# Patient Record
Sex: Female | Born: 1975
Health system: Southern US, Community
[De-identification: ages and names within clinical notes are randomized; demographics above are authoritative.]

## PROBLEM LIST (undated history)

## (undated) DIAGNOSIS — J189 Pneumonia, unspecified organism: Secondary | ICD-10-CM

## (undated) DIAGNOSIS — R112 Nausea with vomiting, unspecified: Secondary | ICD-10-CM

## (undated) DIAGNOSIS — H409 Unspecified glaucoma: Secondary | ICD-10-CM

## (undated) DIAGNOSIS — N189 Chronic kidney disease, unspecified: Secondary | ICD-10-CM

## (undated) DIAGNOSIS — T7840XA Allergy, unspecified, initial encounter: Secondary | ICD-10-CM

## (undated) DIAGNOSIS — I10 Essential (primary) hypertension: Secondary | ICD-10-CM

## (undated) DIAGNOSIS — E669 Obesity, unspecified: Secondary | ICD-10-CM

## (undated) DIAGNOSIS — D869 Sarcoidosis, unspecified: Secondary | ICD-10-CM

## (undated) DIAGNOSIS — Z9889 Other specified postprocedural states: Secondary | ICD-10-CM

## (undated) DIAGNOSIS — F32A Depression, unspecified: Secondary | ICD-10-CM

## (undated) DIAGNOSIS — G932 Benign intracranial hypertension: Secondary | ICD-10-CM

## (undated) DIAGNOSIS — F329 Major depressive disorder, single episode, unspecified: Secondary | ICD-10-CM

## (undated) DIAGNOSIS — K760 Fatty (change of) liver, not elsewhere classified: Secondary | ICD-10-CM

## (undated) HISTORY — DX: Depression, unspecified: F32.A

## (undated) HISTORY — PX: CHOLECYSTECTOMY: SHX55

## (undated) HISTORY — PX: OTHER SURGICAL HISTORY: SHX169

## (undated) HISTORY — DX: Allergy, unspecified, initial encounter: T78.40XA

## (undated) HISTORY — DX: Major depressive disorder, single episode, unspecified: F32.9

## (undated) HISTORY — DX: Unspecified glaucoma: H40.9

## (undated) HISTORY — PX: LAPAROSCOPIC GASTRIC SLEEVE RESECTION: SHX5895

## (undated) HISTORY — DX: Benign intracranial hypertension: G93.2

## (undated) HISTORY — PX: TRIGGER FINGER RELEASE: SHX641

## (undated) HISTORY — DX: Obesity, unspecified: E66.9

## (undated) HISTORY — PX: ABDOMINAL HYSTERECTOMY: SHX81

## (undated) HISTORY — PX: EYE SURGERY: SHX253

## (undated) HISTORY — PX: CARDIAC CATHETERIZATION: SHX172

## (undated) MED FILL — Medication: Fill #0 | Status: CN

---

## 1997-11-27 ENCOUNTER — Other Ambulatory Visit: Admission: RE | Admit: 1997-11-27 | Discharge: 1997-11-27 | Payer: Self-pay | Admitting: Family Medicine

## 1998-03-12 ENCOUNTER — Emergency Department (HOSPITAL_COMMUNITY): Admission: EM | Admit: 1998-03-12 | Discharge: 1998-03-12 | Payer: Self-pay | Admitting: Emergency Medicine

## 1998-04-15 ENCOUNTER — Other Ambulatory Visit: Admission: RE | Admit: 1998-04-15 | Discharge: 1998-04-15 | Payer: Self-pay | Admitting: Obstetrics and Gynecology

## 1998-09-21 ENCOUNTER — Ambulatory Visit (HOSPITAL_COMMUNITY): Admission: RE | Admit: 1998-09-21 | Discharge: 1998-09-21 | Payer: Self-pay | Admitting: Obstetrics and Gynecology

## 1999-03-04 ENCOUNTER — Encounter (INDEPENDENT_AMBULATORY_CARE_PROVIDER_SITE_OTHER): Payer: Self-pay | Admitting: Specialist

## 1999-03-04 ENCOUNTER — Other Ambulatory Visit: Admission: RE | Admit: 1999-03-04 | Discharge: 1999-03-04 | Payer: Self-pay | Admitting: Obstetrics and Gynecology

## 1999-05-17 ENCOUNTER — Inpatient Hospital Stay (HOSPITAL_COMMUNITY): Admission: AD | Admit: 1999-05-17 | Discharge: 1999-05-17 | Payer: Self-pay | Admitting: Obstetrics and Gynecology

## 1999-08-17 ENCOUNTER — Encounter (INDEPENDENT_AMBULATORY_CARE_PROVIDER_SITE_OTHER): Payer: Self-pay

## 1999-08-17 ENCOUNTER — Inpatient Hospital Stay (HOSPITAL_COMMUNITY): Admission: RE | Admit: 1999-08-17 | Discharge: 1999-08-18 | Payer: Self-pay | Admitting: Obstetrics and Gynecology

## 1999-08-31 ENCOUNTER — Emergency Department (HOSPITAL_COMMUNITY): Admission: EM | Admit: 1999-08-31 | Discharge: 1999-08-31 | Payer: Self-pay | Admitting: Emergency Medicine

## 1999-09-16 ENCOUNTER — Emergency Department (HOSPITAL_COMMUNITY): Admission: EM | Admit: 1999-09-16 | Discharge: 1999-09-16 | Payer: Self-pay | Admitting: Emergency Medicine

## 1999-12-27 ENCOUNTER — Encounter: Payer: Self-pay | Admitting: Emergency Medicine

## 1999-12-27 ENCOUNTER — Emergency Department (HOSPITAL_COMMUNITY): Admission: EM | Admit: 1999-12-27 | Discharge: 1999-12-27 | Payer: Self-pay | Admitting: Emergency Medicine

## 2000-03-26 ENCOUNTER — Encounter: Payer: Self-pay | Admitting: Obstetrics and Gynecology

## 2000-03-26 ENCOUNTER — Ambulatory Visit (HOSPITAL_COMMUNITY): Admission: RE | Admit: 2000-03-26 | Discharge: 2000-03-26 | Payer: Self-pay | Admitting: Obstetrics and Gynecology

## 2000-05-01 ENCOUNTER — Encounter: Payer: Self-pay | Admitting: Emergency Medicine

## 2000-05-01 ENCOUNTER — Emergency Department (HOSPITAL_COMMUNITY): Admission: EM | Admit: 2000-05-01 | Discharge: 2000-05-02 | Payer: Self-pay | Admitting: Emergency Medicine

## 2000-06-16 ENCOUNTER — Emergency Department (HOSPITAL_COMMUNITY): Admission: EM | Admit: 2000-06-16 | Discharge: 2000-06-17 | Payer: Self-pay | Admitting: Emergency Medicine

## 2000-06-16 ENCOUNTER — Encounter: Payer: Self-pay | Admitting: Emergency Medicine

## 2000-08-19 ENCOUNTER — Emergency Department (HOSPITAL_COMMUNITY): Admission: EM | Admit: 2000-08-19 | Discharge: 2000-08-19 | Payer: Self-pay | Admitting: *Deleted

## 2000-10-30 ENCOUNTER — Emergency Department (HOSPITAL_COMMUNITY): Admission: EM | Admit: 2000-10-30 | Discharge: 2000-10-31 | Payer: Self-pay | Admitting: Emergency Medicine

## 2000-10-31 ENCOUNTER — Encounter: Payer: Self-pay | Admitting: Emergency Medicine

## 2000-11-06 ENCOUNTER — Encounter: Payer: Self-pay | Admitting: *Deleted

## 2000-11-06 ENCOUNTER — Ambulatory Visit (HOSPITAL_COMMUNITY): Admission: RE | Admit: 2000-11-06 | Discharge: 2000-11-06 | Payer: Self-pay | Admitting: *Deleted

## 2001-02-15 ENCOUNTER — Encounter (INDEPENDENT_AMBULATORY_CARE_PROVIDER_SITE_OTHER): Payer: Self-pay | Admitting: *Deleted

## 2001-02-15 ENCOUNTER — Ambulatory Visit (HOSPITAL_COMMUNITY): Admission: RE | Admit: 2001-02-15 | Discharge: 2001-02-15 | Payer: Self-pay | Admitting: Obstetrics and Gynecology

## 2001-06-10 ENCOUNTER — Encounter: Payer: Self-pay | Admitting: Family Medicine

## 2001-06-10 ENCOUNTER — Ambulatory Visit (HOSPITAL_COMMUNITY): Admission: RE | Admit: 2001-06-10 | Discharge: 2001-06-10 | Payer: Self-pay | Admitting: Family Medicine

## 2001-11-08 ENCOUNTER — Encounter: Payer: Self-pay | Admitting: Family Medicine

## 2001-11-08 ENCOUNTER — Ambulatory Visit (HOSPITAL_COMMUNITY): Admission: RE | Admit: 2001-11-08 | Discharge: 2001-11-08 | Payer: Self-pay | Admitting: Family Medicine

## 2001-12-21 ENCOUNTER — Emergency Department (HOSPITAL_COMMUNITY): Admission: EM | Admit: 2001-12-21 | Discharge: 2001-12-22 | Payer: Self-pay | Admitting: Emergency Medicine

## 2001-12-22 ENCOUNTER — Encounter: Payer: Self-pay | Admitting: Emergency Medicine

## 2002-01-22 ENCOUNTER — Encounter: Payer: Self-pay | Admitting: Emergency Medicine

## 2002-01-22 ENCOUNTER — Emergency Department (HOSPITAL_COMMUNITY): Admission: EM | Admit: 2002-01-22 | Discharge: 2002-01-23 | Payer: Self-pay | Admitting: Emergency Medicine

## 2002-05-12 ENCOUNTER — Encounter: Payer: Self-pay | Admitting: Family Medicine

## 2002-05-12 ENCOUNTER — Ambulatory Visit (HOSPITAL_COMMUNITY): Admission: RE | Admit: 2002-05-12 | Discharge: 2002-05-12 | Payer: Self-pay | Admitting: Family Medicine

## 2002-05-14 ENCOUNTER — Ambulatory Visit (HOSPITAL_COMMUNITY): Admission: RE | Admit: 2002-05-14 | Discharge: 2002-05-14 | Payer: Self-pay | Admitting: Family Medicine

## 2002-05-14 ENCOUNTER — Encounter: Payer: Self-pay | Admitting: Family Medicine

## 2002-08-05 ENCOUNTER — Emergency Department (HOSPITAL_COMMUNITY): Admission: EM | Admit: 2002-08-05 | Discharge: 2002-08-05 | Payer: Self-pay | Admitting: Emergency Medicine

## 2002-09-17 ENCOUNTER — Emergency Department (HOSPITAL_COMMUNITY): Admission: EM | Admit: 2002-09-17 | Discharge: 2002-09-17 | Payer: Self-pay | Admitting: Emergency Medicine

## 2002-10-20 ENCOUNTER — Emergency Department (HOSPITAL_COMMUNITY): Admission: EM | Admit: 2002-10-20 | Discharge: 2002-10-21 | Payer: Self-pay | Admitting: Emergency Medicine

## 2002-10-21 ENCOUNTER — Encounter: Payer: Self-pay | Admitting: Emergency Medicine

## 2002-11-01 ENCOUNTER — Emergency Department (HOSPITAL_COMMUNITY): Admission: EM | Admit: 2002-11-01 | Discharge: 2002-11-01 | Payer: Self-pay | Admitting: Emergency Medicine

## 2002-11-01 ENCOUNTER — Encounter: Payer: Self-pay | Admitting: Emergency Medicine

## 2003-01-03 ENCOUNTER — Emergency Department (HOSPITAL_COMMUNITY): Admission: EM | Admit: 2003-01-03 | Discharge: 2003-01-04 | Payer: Self-pay | Admitting: Emergency Medicine

## 2003-01-04 ENCOUNTER — Encounter: Payer: Self-pay | Admitting: Emergency Medicine

## 2003-01-06 ENCOUNTER — Ambulatory Visit (HOSPITAL_COMMUNITY): Admission: RE | Admit: 2003-01-06 | Discharge: 2003-01-06 | Payer: Self-pay | Admitting: Family Medicine

## 2003-01-06 ENCOUNTER — Encounter: Payer: Self-pay | Admitting: Family Medicine

## 2003-03-18 ENCOUNTER — Other Ambulatory Visit: Admission: RE | Admit: 2003-03-18 | Discharge: 2003-03-18 | Payer: Self-pay | Admitting: Obstetrics and Gynecology

## 2003-04-07 ENCOUNTER — Emergency Department (HOSPITAL_COMMUNITY): Admission: EM | Admit: 2003-04-07 | Discharge: 2003-04-07 | Payer: Self-pay | Admitting: Emergency Medicine

## 2003-07-30 ENCOUNTER — Emergency Department (HOSPITAL_COMMUNITY): Admission: EM | Admit: 2003-07-30 | Discharge: 2003-07-30 | Payer: Self-pay | Admitting: Emergency Medicine

## 2004-02-04 ENCOUNTER — Ambulatory Visit (HOSPITAL_COMMUNITY): Admission: RE | Admit: 2004-02-04 | Discharge: 2004-02-04 | Payer: Self-pay | Admitting: Family Medicine

## 2004-02-05 ENCOUNTER — Emergency Department (HOSPITAL_COMMUNITY): Admission: EM | Admit: 2004-02-05 | Discharge: 2004-02-05 | Payer: Self-pay | Admitting: Emergency Medicine

## 2004-08-29 ENCOUNTER — Emergency Department (HOSPITAL_COMMUNITY): Admission: EM | Admit: 2004-08-29 | Discharge: 2004-08-29 | Payer: Self-pay | Admitting: Emergency Medicine

## 2004-08-30 ENCOUNTER — Encounter: Admission: RE | Admit: 2004-08-30 | Discharge: 2004-08-30 | Payer: Self-pay | Admitting: Cardiology

## 2004-10-28 ENCOUNTER — Encounter (INDEPENDENT_AMBULATORY_CARE_PROVIDER_SITE_OTHER): Payer: Self-pay | Admitting: Specialist

## 2004-10-28 ENCOUNTER — Ambulatory Visit (HOSPITAL_COMMUNITY): Admission: RE | Admit: 2004-10-28 | Discharge: 2004-10-28 | Payer: Self-pay

## 2004-11-03 ENCOUNTER — Emergency Department (HOSPITAL_COMMUNITY): Admission: EM | Admit: 2004-11-03 | Discharge: 2004-11-03 | Payer: Self-pay | Admitting: Emergency Medicine

## 2004-12-26 ENCOUNTER — Ambulatory Visit: Payer: Self-pay | Admitting: Internal Medicine

## 2005-08-06 ENCOUNTER — Encounter: Admission: RE | Admit: 2005-08-06 | Discharge: 2005-08-06 | Payer: Self-pay | Admitting: Family Medicine

## 2005-10-11 ENCOUNTER — Encounter: Admission: RE | Admit: 2005-10-11 | Discharge: 2005-10-11 | Payer: Self-pay | Admitting: Gastroenterology

## 2005-10-31 ENCOUNTER — Encounter: Admission: RE | Admit: 2005-10-31 | Discharge: 2005-10-31 | Payer: Self-pay | Admitting: Gastroenterology

## 2005-11-03 ENCOUNTER — Emergency Department (HOSPITAL_COMMUNITY): Admission: EM | Admit: 2005-11-03 | Discharge: 2005-11-03 | Payer: Self-pay | Admitting: Emergency Medicine

## 2006-10-14 ENCOUNTER — Emergency Department (HOSPITAL_COMMUNITY): Admission: EM | Admit: 2006-10-14 | Discharge: 2006-10-14 | Payer: Self-pay | Admitting: Emergency Medicine

## 2006-12-07 ENCOUNTER — Emergency Department (HOSPITAL_COMMUNITY): Admission: EM | Admit: 2006-12-07 | Discharge: 2006-12-07 | Payer: Self-pay | Admitting: Emergency Medicine

## 2006-12-21 ENCOUNTER — Emergency Department (HOSPITAL_COMMUNITY): Admission: EM | Admit: 2006-12-21 | Discharge: 2006-12-21 | Payer: Self-pay | Admitting: Emergency Medicine

## 2007-07-13 ENCOUNTER — Emergency Department (HOSPITAL_COMMUNITY): Admission: EM | Admit: 2007-07-13 | Discharge: 2007-07-13 | Payer: Self-pay | Admitting: Emergency Medicine

## 2007-12-14 ENCOUNTER — Emergency Department (HOSPITAL_BASED_OUTPATIENT_CLINIC_OR_DEPARTMENT_OTHER): Admission: EM | Admit: 2007-12-14 | Discharge: 2007-12-14 | Payer: Self-pay | Admitting: Emergency Medicine

## 2007-12-15 ENCOUNTER — Ambulatory Visit (HOSPITAL_COMMUNITY): Admission: RE | Admit: 2007-12-15 | Discharge: 2007-12-15 | Payer: Self-pay | Admitting: Emergency Medicine

## 2008-01-06 ENCOUNTER — Emergency Department (HOSPITAL_COMMUNITY): Admission: EM | Admit: 2008-01-06 | Discharge: 2008-01-07 | Payer: Self-pay | Admitting: Emergency Medicine

## 2008-10-01 ENCOUNTER — Ambulatory Visit: Payer: Self-pay | Admitting: Radiology

## 2008-10-01 ENCOUNTER — Emergency Department (HOSPITAL_BASED_OUTPATIENT_CLINIC_OR_DEPARTMENT_OTHER): Admission: EM | Admit: 2008-10-01 | Discharge: 2008-10-01 | Payer: Self-pay | Admitting: Emergency Medicine

## 2009-07-04 ENCOUNTER — Encounter: Payer: Self-pay | Admitting: Emergency Medicine

## 2009-07-04 ENCOUNTER — Ambulatory Visit: Payer: Self-pay | Admitting: Diagnostic Radiology

## 2009-07-05 ENCOUNTER — Encounter (INDEPENDENT_AMBULATORY_CARE_PROVIDER_SITE_OTHER): Payer: Self-pay | Admitting: Internal Medicine

## 2009-07-05 ENCOUNTER — Observation Stay (HOSPITAL_COMMUNITY): Admission: EM | Admit: 2009-07-05 | Discharge: 2009-07-07 | Payer: Self-pay | Admitting: Internal Medicine

## 2009-07-28 ENCOUNTER — Ambulatory Visit (HOSPITAL_BASED_OUTPATIENT_CLINIC_OR_DEPARTMENT_OTHER): Admission: RE | Admit: 2009-07-28 | Discharge: 2009-07-28 | Payer: Self-pay | Admitting: Internal Medicine

## 2009-07-31 ENCOUNTER — Ambulatory Visit: Payer: Self-pay | Admitting: Internal Medicine

## 2010-01-02 ENCOUNTER — Ambulatory Visit: Payer: Self-pay | Admitting: Diagnostic Radiology

## 2010-01-02 ENCOUNTER — Emergency Department (HOSPITAL_BASED_OUTPATIENT_CLINIC_OR_DEPARTMENT_OTHER): Admission: EM | Admit: 2010-01-02 | Discharge: 2010-01-03 | Payer: Self-pay | Admitting: Emergency Medicine

## 2010-04-03 DIAGNOSIS — D869 Sarcoidosis, unspecified: Secondary | ICD-10-CM

## 2010-04-03 HISTORY — DX: Sarcoidosis, unspecified: D86.9

## 2010-04-24 ENCOUNTER — Encounter: Payer: Self-pay | Admitting: Gastroenterology

## 2010-04-25 ENCOUNTER — Encounter: Payer: Self-pay | Admitting: Emergency Medicine

## 2010-06-22 LAB — COMPREHENSIVE METABOLIC PANEL
ALT: 14 U/L (ref 0–35)
AST: 20 U/L (ref 0–37)
Albumin: 4.4 g/dL (ref 3.5–5.2)
Alkaline Phosphatase: 95 U/L (ref 39–117)
GFR calc Af Amer: >60 mL/min (ref >60)
GFR calc non Af Amer: >60 mL/min (ref >60)
Glucose, Bld: 288 mg/dL — ABNORMAL HIGH (ref 70–99)
Total Bilirubin: 0.3 mg/dL (ref 0.3–1.2)
Total Protein: 8.2 g/dL (ref 6.0–8.3)

## 2010-06-22 LAB — BASIC METABOLIC PANEL
BUN: 14 mg/dL (ref 6–23)
Calcium: 9 mg/dL (ref 8.4–10.5)
GFR calc Af Amer: >60 mL/min (ref >60)
GFR calc non Af Amer: >60 mL/min (ref >60)
GFR calc non Af Amer: >60 mL/min (ref >60)
Glucose, Bld: 97 mg/dL (ref 70–99)
Potassium: 3.9 mEq/L (ref 3.5–5.1)
Sodium: 139 mEq/L (ref 135–145)
Sodium: 139 mEq/L (ref 135–145)

## 2010-06-22 LAB — DIFFERENTIAL
Basophils Absolute: 0 10*3/uL (ref 0.0–0.1)
Lymphocytes Relative: 14 % (ref 12–46)
Monocytes Relative: 3 % (ref 3–12)
Neutrophils Relative %: 83 % — ABNORMAL HIGH (ref 43–77)

## 2010-06-22 LAB — CBC
HCT: 36.7 % (ref 36.0–46.0)
Hemoglobin: 12.3 g/dL (ref 12.0–15.0)
Hemoglobin: 12.4 g/dL (ref 12.0–15.0)
Hemoglobin: 12.5 g/dL (ref 12.0–15.0)
MCHC: 32.5 g/dL (ref 30.0–36.0)
MCV: 83.9 fL (ref 78.0–100.0)
Platelets: 405 10*3/uL — ABNORMAL HIGH (ref 150–400)
Platelets: 351 10*3/uL (ref 150–400)
Platelets: 353 10*3/uL (ref 150–400)
RDW: 14.5 % (ref 11.5–15.5)
RDW: 15.6 % — ABNORMAL HIGH (ref 11.5–15.5)
WBC: 14.5 10*3/uL — ABNORMAL HIGH (ref 4.0–10.5)

## 2010-06-22 LAB — CARDIAC PANEL(CRET KIN+CKTOT+MB+TROPI)
CK, MB: 0.9 ng/mL (ref 0.3–4.0)
CK, MB: 1.3 ng/mL (ref 0.3–4.0)
Relative Index: 0.8 (ref 0.0–2.5)
Total CK: 117 U/L (ref 7–177)
Total CK: 124 U/L (ref 7–177)
Troponin I: <0.01 ng/mL (ref 0.00–0.06)

## 2010-06-22 LAB — POCT I-STAT 3, VENOUS BLOOD GAS (G3P V)
Acid-base deficit: 3 mmol/L — ABNORMAL HIGH (ref 0.0–2.0)
Bicarbonate: 23.2 mEq/L (ref 20.0–24.0)
O2 Saturation: 73 %
pO2, Ven: 41 mmHg (ref 30.0–45.0)

## 2010-06-22 LAB — POCT I-STAT 3, ART BLOOD GAS (G3+)
Acid-base deficit: 1 mmol/L (ref 0.0–2.0)
O2 Saturation: 97 %
pO2, Arterial: 91 mmHg (ref 80.0–100.0)

## 2010-06-22 LAB — POCT CARDIAC MARKERS
CKMB, poc: 1 ng/mL (ref 1.0–8.0)
Myoglobin, poc: 62.7 ng/mL (ref 12–200)

## 2010-06-22 LAB — PROTIME-INR: Prothrombin Time: 12.8 seconds (ref 11.6–15.2)

## 2010-06-22 LAB — ANGIOTENSIN CONVERTING ENZYME: Angiotensin-Converting Enzyme: 69 U/L — ABNORMAL HIGH (ref 9–67)

## 2010-06-22 LAB — LIPID PANEL
HDL: 37 mg/dL — ABNORMAL LOW (ref >39)
Total CHOL/HDL Ratio: 3.4 RATIO

## 2010-06-28 ENCOUNTER — Emergency Department (HOSPITAL_COMMUNITY)
Admission: EM | Admit: 2010-06-28 | Discharge: 2010-06-28 | Disposition: A | Discharge status: Home / Self Care | Payer: BC Managed Care – PPO | Attending: Emergency Medicine | Admitting: Emergency Medicine

## 2010-06-28 ENCOUNTER — Emergency Department (HOSPITAL_COMMUNITY): Payer: BC Managed Care – PPO

## 2010-06-28 DIAGNOSIS — J45909 Unspecified asthma, uncomplicated: Secondary | ICD-10-CM | POA: Insufficient documentation

## 2010-06-28 DIAGNOSIS — R0602 Shortness of breath: Secondary | ICD-10-CM | POA: Insufficient documentation

## 2010-06-28 DIAGNOSIS — R070 Pain in throat: Secondary | ICD-10-CM | POA: Insufficient documentation

## 2010-06-28 DIAGNOSIS — R0989 Other specified symptoms and signs involving the circulatory and respiratory systems: Secondary | ICD-10-CM | POA: Insufficient documentation

## 2010-06-28 DIAGNOSIS — J329 Chronic sinusitis, unspecified: Secondary | ICD-10-CM | POA: Insufficient documentation

## 2010-06-28 DIAGNOSIS — J3489 Other specified disorders of nose and nasal sinuses: Secondary | ICD-10-CM | POA: Insufficient documentation

## 2010-06-28 DIAGNOSIS — Z79899 Other long term (current) drug therapy: Secondary | ICD-10-CM | POA: Insufficient documentation

## 2010-06-28 DIAGNOSIS — R51 Headache: Secondary | ICD-10-CM | POA: Insufficient documentation

## 2010-06-28 DIAGNOSIS — R0609 Other forms of dyspnea: Secondary | ICD-10-CM | POA: Insufficient documentation

## 2010-07-10 LAB — D-DIMER, QUANTITATIVE: D-Dimer, Quant: <0.22 ug{FEU}/mL (ref 0.00–0.48)

## 2010-07-10 LAB — CBC
HCT: 38 % (ref 36.0–46.0)
Hemoglobin: 12.8 g/dL (ref 12.0–15.0)
RBC: 4.57 MIL/uL (ref 3.87–5.11)
WBC: 11.1 10*3/uL — ABNORMAL HIGH (ref 4.0–10.5)

## 2010-07-10 LAB — POCT CARDIAC MARKERS
CKMB, poc: <1 ng/mL — ABNORMAL LOW (ref 1.0–8.0)
CKMB, poc: <1 ng/mL — ABNORMAL LOW (ref 1.0–8.0)
Myoglobin, poc: 72.9 ng/mL (ref 12–200)
Troponin i, poc: <0.05 ng/mL (ref 0.00–0.09)

## 2010-07-10 LAB — DIFFERENTIAL
Basophils Absolute: 0 K/uL (ref 0.0–0.1)
Basophils Relative: 0 % (ref 0–1)
Eosinophils Absolute: 0.3 K/uL (ref 0.0–0.7)
Eosinophils Relative: 2 % (ref 0–5)
Lymphocytes Relative: 38 % (ref 12–46)
Lymphs Abs: 4.3 K/uL — ABNORMAL HIGH (ref 0.7–4.0)
Monocytes Absolute: 0.7 K/uL (ref 0.1–1.0)
Monocytes Relative: 6 % (ref 3–12)
Neutro Abs: 5.8 K/uL (ref 1.7–7.7)
Neutrophils Relative %: 53 % (ref 43–77)

## 2010-07-10 LAB — BASIC METABOLIC PANEL
GFR calc Af Amer: >60 mL/min (ref >60)
GFR calc non Af Amer: >60 mL/min (ref >60)
Potassium: 3.9 mEq/L (ref 3.5–5.1)
Sodium: 142 mEq/L (ref 135–145)

## 2010-08-19 NOTE — H&P (Signed)
[facility]  Patient:    Denise Cook, Denise Cook                    MRN: [id number] Adm. Date:  [id number] Disc. Date: [id number] Attending:  Malon Kindle                         History and Physical  CHIEF COMPLAINT:  Pelvic pain and irregular menses.  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 In March of 2000, she complained of left lower quadrant pain which radiated to the midline for approximately 1 month and was worse when she was on her feet and was constant with exacerbations and was crampy in nature. There were no significant aggravating or relieving factors and she was taking oral contraceptives at that point. She was continued on her oral contraceptives, treated with an antibiotic and nonsteroidals and had no improvement in her pain. Thus on June 20 of last year, she underwent laparoscopic lysis of adhesions with fulguration of endometriosis and bilateral tubal fulguration. At that time, she was found to have endometriotic implants in the posterior cul-de-sac, the uterosacral ligaments and the left ovarian fossa and her sigmoid colon was adherent to the left pelvic brim. Postoperatively, she was treated with 3 months of Lupron and she did have some menopausal symptoms on this treated with Prempro 2.5. She was seen in December of 2000 for follow-up and was having some recurrence of her pain. Options were discussed at that point and she agreed to try Lunelle for ovarian suppression for 3 to 4 months. She only got 1 Lunelle injection as this did not agree with her. I most recently saw her on March of this year. She complained of irregular menses every 2 to 3 months passing clots with persistent pelvic pain. The 1 month of Lunelle did not help these symptoms and again she failed Lupron  therapy in the past. She desires definitive surgical therapy. We discussed her young age and her possible need for long-term hormone replacement therapy if her ovaries are removed. She does not desire to retain her fertility and wishes to proceed with definitive surgical therapy.  PAST OB HISTORY:  Significant for 1 vaginal delivery at term without complications. 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 With normal follow-up Pap smears the most recent being in December of 2000.  PAST MEDICAL HISTORY:  Significant for a remote history of asthma and she uses her albuterol inhaler rarely and has not used it in the past 6 months.  PAST SURGICAL HISTORY:  Significant only for the above mentioned laparoscopy with lysis of adhesions, fulguration of endometriosis and bilateral tubal fulguration.  ALLERGIES:  None.  CURRENT MEDICATIONS:  Just the albuterol MDI rarely.  FAMILY HISTORY:  Noncontributory.  REVIEW OF SYSTEMS:  Positive for dyspareunia and occasional dysuria.  PHYSICAL EXAMINATION:  GENERAL:  She is a well-developed, well-nourished black female who is in no acute distress.  VITAL SIGNS:  Her weight is approximately 200 pounds.  HEENT:  Pupils equal round and reactive to light and accommodation. Extraocular muscles are intact. Oropharynx is clear without erythema or exudates.  NECK:  Supple without lymphadenopathy or thyromegaly.  LUNGS:  Clear to auscultation.  HEART:  Regular rate and rhythm without murmurs.  ABDOMEN:  Soft, nontender, nondistended without  palpable masses and with a well healed laparoscopic incision infraumbilically and suprapubically.  EXTREMITIES:  No edema, are nontender and DTRs are 2/4 and symmetric.  PELVIC:  External genitalia reveals no lesions. On speculum exam, there are no lesions and a Pipelle in the past has sounded to 7 cm. On bimanual exam, she has a small anteverted uterus which is slightly tender. She had  slightly tender uterosacral ligaments and tender bilateral adnexa without palpable masses.  ASSESSMENT:  Recurrent persistent pelvic pain probably due to recurrent endometriosis. The patient also has irregular bleeding. Options have been discussed with the patient and although she is young, she wishes to proceed with definitive surgical therapy. The risks of surgery including bleeding, infection and damage to surrounding organs as well as permanent sterility have been discussed with her and she agrees to proceed. We have agreed to attempt to preserve her ovaries unless they appear significantly involved with endometriosis or appear otherwise abnormal.  PLAN:  Admit the patient for laparoscopically assisted vaginal hysterectomy with possible bilateral salpingo-oophorectomy and cystoscopy to confirm ureteral patency. DD:  08/15/99 TD:  08/16/99 Job: [id number]

## 2010-08-19 NOTE — Op Note (Signed)
[facility]  Patient:    Denise Cook, Denise Cook Visit Number: [id number] MRN: [id number]          Service Type: DSU Location: DAY Attending Physician:  Michaele Offer Proc. Date: 02/15/01 Admit Date:  02/15/2001                             Operative Report  PREOPERATIVE DIAGNOSES:  Pelvic pain and history of endometriosis.  POSTOPERATIVE DIAGNOSES:  Pelvic pain, pelvic adhesions.  PROCEDURE:  Open laparoscopic bilateral salpingo-oophorectomy and adhesiolysis.  SURGEON:  Zenaida Niece, M.D.  ANESTHESIA:  General endotracheal tube.  ESTIMATED BLOOD LOSS:  Less than 50 cc.  FINDINGS:  Both ovaries were adherent laterally but otherwise appeared normal. Sigmoid colon was adherent to the left abdominal wall and left pelvic brim; there were omental and bowel adhesions in the pelvis.  PROCEDURE IN DETAIL:  The patient was taken to the operating room and placed in the dorsal supine position.  General anesthesia was induced, and she was placed in mobile stirrups.  Her abdomen was prepped and draped in the usual sterile fashion for a laparoscopic procedure; her bladder was drained with a red rubber catheter, and a sponge stick was placed in her vagina for manipulation.  Her infraumbilical skin was then infiltrated with 0.25% Marcaine.  A 3 cm horizontal incision was made and carried down to the fascia. The fascia was identified and elevated with Kelly clamps.  It was then incised sharply and the peritoneum entered bluntly.  A pursestring suture of 0 Vicryl was placed around the fascia and held for later use.  The Hasson cannula was then introduced and the abdomen insufflated with CO2 gas.  Placement was confirmed by the laparoscope.  The 5 mm ports were placed on each side under direct visualization through previous scars.  Inspection revealed the above-mentioned findings.  The sigmoid colon was taken down bluntly and sharply from the left  pelvic brim and left abdominal sidewall.  Adhesions in the pelvis were taken down with bipolar cautery followed by incision.  The right infundibulopelvic ligament was isolated and coagulated with bipolar cautery and transected.  The mesosalpinx and mesovarium was then likewise coagulated and transected to free up the right ovary.  The ovary was placed in the posterior cul-de-sac for later removal.  Further adhesions were taken down on the left side to isolate the left ovary.  The left infundibulopelvic ligament was desiccated with bipolar cautery and transected.  The ovary was also adherent to the sidewall.  Mesosalpinx and adhesions were taken down with bipolar cautery and sharp dissection to free up the left ovary.  This was also then placed in the cul-de-sac.  Both ovaries were then placed in the anterior cul-de-sac.  All sites were inspected and found to be hemostatic.  The pelvis was irrigated and again found to be hemostatic.  The laparoscope was removed, and the 5 mm scope was placed through the left lower quadrant.  An EndoCatch was placed through the umbilical trocar and both ovaries scooped into the EndoCatch.  These were then removed through the umbilical incision after the fascial incision was extended a short distance sharply.  The umbilical trocar was reintroduced, and inspection revealed the pelvis to be hemostatic. Intergel was then placed in the pelvis to help prevent further adhesions.  The 5 mm ports were removed under direct visualization.  All gas was allowed to deflate from the abdomen, and the  umbilical trocar was removed.  The previously-placed pursestring suture was tied and achieved adequate closure of the fascia.  The infraumbilical incision was closed with running subcuticular suture of 4-0 Vicryl.  The other incisions were closed with interrupted subcuticular sutures of 4-0 Vicryl, and all incisions were then closed with Steri-Strips and band-aids.  The patient  tolerated the procedure well, was awakened in the operating room and taken to the recovery room in stable condition. Attending Physician:  Michaele Offer DD:  02/15/01 TD:  02/15/01 Job: [id number]

## 2010-08-19 NOTE — Op Note (Signed)
NAME:  Denise Cook, Denise Cook           ACCOUNT NO.:  [id number]   MEDICAL RECORD NO.:  [id number]          PATIENT TYPE:  AMB   LOCATION:  DAY                          FACILITY:  [facility]   PHYSICIAN:  Lorre Munroe., M.D.DATE OF BIRTH:  1976-01-16   DATE OF PROCEDURE:  10/28/2004  DATE OF DISCHARGE:                                 OPERATIVE REPORT   PREOPERATIVE DIAGNOSIS:  Symptomatic gallstones.   POSTOPERATIVE DIAGNOSIS:  Symptomatic gallstones.   OPERATION:  Laparoscopic cholecystectomy.   SURGEON:  Lebron Conners, M.D.   ANESTHESIA:  General and local.   DESCRIPTION OF PROCEDURE:  After the patient was monitored and anesthetized  with general endotracheal anesthesia and had routine preparation and draping  of the abdomen, I first infiltrated a long-acting local anesthetic in the  area just below the umbilicus. I made about a 3 cm transverse incision at  that point, dissected down through the subcutaneous tissues to the midline  fascia and cut it in the midline about 2 cm. I then bluntly entered the  peritoneal cavity and placed a #0 Vicryl pursestring suture in the fascia  and secured a Hassan cannula. After inflation of the abdomen with carbon  dioxide, I examined the abdominal contents and saw a somewhat fatty  appearing liver, gallbladder with adhesions of omentum and duodenum to its  undersurface and to the undersurface of the liver, but no other evidence of  any inflammatory disease or other abnormal process. I then infiltrated local  anesthetic at three additional sites and put in an 11 mm epigastric port and  two 5 mm lateral abdominal ports on the right side. With the patient  positioned head-up foot down and tilted to the left, I retracted the fundus  of the gallbladder toward the right shoulder and using a combination of  scissors and cautery and blunt dissection, I took down the adhesions to the  liver and the gallbladder. The duodenum was quite closely adherent to  the  gallbladder and I very carefully dissected that away with the scissors. I  then was able to visualize the infundibulum of the gallbladder and pulled it  to the right side and dissected carefully and the hepatoduodenal ligament  anteriorly and posteriorly until I made a generous window between the liver  and the gallbladder and clearly defined the cystic duct emerging from the  gallbladder and entering the common bile duct. I could see a small gallstone  in the distal cystic duct. I clipped the cystic artery with three clips and  divided between the two closest to the gallbladder and I clipped the cystic  duct with four clips and divided between the two closest to the gallbladder.  The gallbladder then had good mobility and I used cautery to dissect it off  the liver gaining hemostasis with the cautery. I saw one further small  artery and clipped and divided that as well. I assured good hemostasis. I  irrigated briefly and removed the irrigant. I then removed the gallbladder  through the umbilical incision and tied the pursestring suture. I removed  the two lateral ports under direct vision of the  laparoscope and saw no  bleeding from the abdominal wall. After allowing the carbon dioxide to  escape,  I removed  the epigastric port. I closed all skin incisions with intracuticular 4-0  Vicryl and Steri-Strips. Sponge, needle and instrument counts were correct.  After application of bandages on awakening from anesthesia, the patient went  to PACU in stable condition.       WB/MEDQ  D:  10/28/2004  T:  10/28/2004  Job:  [id number]   cc:   Stacie Acres. Cliffton Asters, M.D.  Fax: [phone]

## 2010-08-19 NOTE — Op Note (Signed)
[facility]  Patient:    Denise Cook, Denise Cook              MRN: [id number] Proc. Date: 08/17/99 Adm. Date:  [id number] Attending:  Michaele Offer                           Operative Report  PREOPERATIVE DIAGNOSIS:       Pelvic pain, endometriosis, and irregular menses.   POSTOPERATIVE DIAGNOSIS:      Pelvic pain, endometriosis, and irregular menses.  PROCEDURE:                    Laparoscopically-assisted vaginal hysterectomy, lysis of adhesions, and cystoscopy.  SURGEON:                      Zenaida Niece, M.D.  ASSISTANT:                    Malachi Pro. Ambrose Mantle, M.D.  ANESTHESIA:                   General endotracheal anesthesia.  ESTIMATED BLOOD LOSS:         150 cc.  FINDINGS:                     Normal size uterus and normal appearing ovaries and tubes with evidence of previous tubal ligation.  There was no evidence of recurrent endometriosis with no visible lesions.  Both cul-de-sacs appear normal.  She also had a normal appendix, liver edge, and gallbladder.  Her sigmoid colon was adherent to the left abdominal side wall.  Chemoprophylaxis was with Ancef 1 gram.  COUNTS:                       Correct.  CONDITION:                    Stable.  DESCRIPTION OF PROCEDURE:     After appropriate informed consent was obtained, he patient was taken to the operating room and placed in the dorsal supine position. General anesthesia was induced and she was placed in mobile stirrups.  Her abdomen, perineum, and vagina were prepped and draped in the usual sterile fashion for laparoscopically-assisted vaginal hysterectomy.  Her bladder was drained with a red rubber catheter, and a Hulka tenaculum was applied to her cervix for uterine manipulation.  Her infraumbilical skin was then infiltrated with 0.25% Marcaine and a 1.5 cm horizontal incision was made.  The 10/11 disposable trocar was introduced into the peritoneal cavity and  placement confirmed by an opening pressure of 4 mHg and placement also confirmed by the laparoscope.  One 5 mm port was placed on the left and one on the right for manipulation through instruments.  This was done under direct visualization with the laparoscope.  The pelvis was inspected and found to have the above mentioned findings.  There was no evidence of significant adhesions or endometriosis.  As both ovaries appeared normal, we elected to leave them behind.  The utero-ovarian pedicles, round ligaments, upper broad ligaments, to the level of the uterine arteries were taken down with bipolar cautery using the tripolar device on each side.  This was done with good hemostasis.  The anterior peritoneum was incised across the uterus and the bladder was pushed inferiorly.  The posterior cul-de-sac was free.  At this time, the laparoscopic  portion of the procedure was stopped.  The patients legs were elevated in the stirrups and a weighted speculum was inserted into the vagina.  The cervix was grasped with Christella Hartigan tenaculum and the  cervicovaginal mucosa was infiltrated with a dilute solution of Pitressin.  The  cervicovaginal mucosa was then incised with electrocautery.  The posterior cul-de-sac was entered sharply.  The anterior vagina was pushed off the cervix nd a Deaver used to retract the bladder anteriorly.  The anterior peritoneum was not entered at this time.  The uterosacral ligaments were clamped, transected, and ligated on each side with #1 chromic and tagged for later use.  The uterine arteries and cardinal ligaments were then likewise clamped, transected, and ligated after the anterior peritoneum was easily identified and entered.  The uterus was then able to be easily removed.  All pedicles were inspected and found to be hemostatic.  The uterosacral ligaments were plicated in the midline with one suture of 2-0 silk.  All sites were irrigated, inspected, and found  to be hemostatic. he vagina was closed in a running locking fashion with 2-0 Vicryl in a vertical fashion.  This achieved adequate hemostasis and vaginal closure.  The vagina was irrigated and found to be hemostatic.  When the vagina was being closed, the patient was given Indigo-Carmine IV.  A Foley catheter was inserted and the bladder drained of approximately 75 cc of urine. 200 cc of water were instilled and the Foley catheter was removed.  The 70 degree cystoscope was inserted and Indigo-Carmine was seen to come from both ureteral orifices without obstruction.  The cystoscope was removed and the Foley catheter reinserted and connected to the bag.  The patients legs were then lowered.  Both Malachi Pro. Ambrose Mantle, M.D. and I changed gloves and the abdomen was reinsufflated with gas.  Inspection with the laparoscope revealed all pedicles to be hemostatic. The adhesions of the colon to the left abdominal side wall were taken down sharply and with bipolar cautery for bleeders.  This did mobilize the sigmoid colon adequately.  At this point, the pelvis was irrigated and found to be hemostatic. The 5 mm trocars were removed under direct visualization and the laparoscope was then removed.  All gas was then allowed to deflate from the abdomen and the 10/11 disposable trocar was then removed also.  The skin incisions were closed with interrupted subcuticular sutures of 4-0 Vicryl followed by Steri-Strips and Band-Aids.  The patient tolerated the procedure well, was extubated in the operating room and taken to the recovery room in stable condition. DD:  08/17/99 TD:  08/18/99 Job: [id number]

## 2010-08-19 NOTE — Discharge Summary (Signed)
[facility]  Patient:    Denise Cook, Denise Cook              MRN: [id number] Adm. Date:  [id number] Disc. Date: [id number] Attending:  Michaele Offer                           Discharge Summary  ADMISSION DIAGNOSES: 1. Pelvic pain. 2. Endometriosis. 3. Irregular menses.  DISCHARGE DIAGNOSES: 1. Pelvic pain. 2. Endometriosis. 3. Irregular menses.  PROCEDURE:  Laparoscopic assisted vaginal hysterectomy with lysis of adhesions and cystoscopy.  COMPLICATIONS:  None.  CONSULTATIONS:  None.  HISTORY AND PHYSICAL:  This is a 35 year old black female gravida 1, para 1 who has known endometriosis by laparoscopy.  She has had persistent pelvic pain and irregular bleeding on multiple hormonal regimens and desires definitive surgical therapy and is admitted for this at this time.  PAST MEDICAL HISTORY:  One vaginal delivery and a history of CIN 1. Significant for a remote history of asthma.  PAST SURGICAL HISTORY:  Significant only for the above mentioned laparoscopy with fulguration of endometriosis, lysis of adhesions, and tubal fulguration.  PHYSICAL EXAMINATION:  VITAL SIGNS:  Weight of approximately 200 pounds.  ABDOMEN:  Benign with well healed laparoscopic incision infraumbilically and suprapubically.  PELVIC:  She has no obvious lesions and a pipelle has sounded to 7 cm.  Uterus is small, anteverted, slightly tender.  She has tender uterosacral ligaments and tender bilateral adnexa without palpable masses.  HOSPITAL COURSE:  Patient was admitted on the day of surgery and underwent the above mentioned procedure without complications.  This was done under general anesthesia with a 150 cc blood loss.  She had a normal sized uterus and there was no visible evidence of recurrent endometriosis.  Both ovaries appeared normal and they were left behind.  Her colon was adherent to the left abdominal side wall and these adhesions were taken  down sharply. Postoperatively she did very well.  Was rapidly able to ambulate and tolerate a regular diet.  She also remained afebrile.  Preoperative hemoglobin was 11.8, postoperatively was 11.0.  On the evening of postoperative day # 1 she was felt to be stable enough for discharge home.  CONDITION ON DISCHARGE:  Stable.  DISPOSITION:  Discharged to home.  DISCHARGE INSTRUCTIONS:  Her diet is a regular diet.  Her activity is pelvic rest.  No strenuous activity.  No driving.  DISCHARGE MEDICATIONS:  Percocet p.r.n. pain.  She is to follow up in two weeks. DD:  08/18/99 TD:  08/22/99 Job: [id number]

## 2010-09-13 ENCOUNTER — Emergency Department (INDEPENDENT_AMBULATORY_CARE_PROVIDER_SITE_OTHER): Payer: No Typology Code available for payment source

## 2010-09-13 ENCOUNTER — Emergency Department (HOSPITAL_BASED_OUTPATIENT_CLINIC_OR_DEPARTMENT_OTHER)
Admission: EM | Admit: 2010-09-13 | Discharge: 2010-09-13 | Disposition: A | Discharge status: Home / Self Care | Payer: No Typology Code available for payment source | Attending: Emergency Medicine | Admitting: Emergency Medicine

## 2010-09-13 DIAGNOSIS — J45909 Unspecified asthma, uncomplicated: Secondary | ICD-10-CM | POA: Insufficient documentation

## 2010-09-13 DIAGNOSIS — Y92410 Unspecified street and highway as the place of occurrence of the external cause: Secondary | ICD-10-CM | POA: Insufficient documentation

## 2010-09-13 DIAGNOSIS — M549 Dorsalgia, unspecified: Secondary | ICD-10-CM | POA: Insufficient documentation

## 2011-01-03 LAB — CBC
HCT: 40.7
Hemoglobin: 13.4
MCHC: 32.8
MCV: 83.7
Platelets: 322
RBC: 4.85
RDW: 14.7
WBC: 9.1

## 2011-01-03 LAB — BASIC METABOLIC PANEL
CO2: 24
Chloride: 104
Creatinine, Ser: 1.04
GFR calc Af Amer: >60

## 2011-01-03 LAB — DIFFERENTIAL
Basophils Absolute: 0
Basophils Relative: 0
Eosinophils Absolute: 0.2
Eosinophils Relative: 2
Lymphocytes Relative: 30
Lymphs Abs: 2.7
Monocytes Absolute: 0.5
Monocytes Relative: 5
Neutro Abs: 5.7
Neutrophils Relative %: 63

## 2011-01-03 LAB — POCT CARDIAC MARKERS
CKMB, poc: <1 — ABNORMAL LOW
CKMB, poc: <1 — ABNORMAL LOW
Myoglobin, poc: 56.3
Myoglobin, poc: 68.4
Troponin i, poc: <0.05
Troponin i, poc: <0.05

## 2011-01-03 LAB — BASIC METABOLIC PANEL WITH GFR
BUN: 7
Calcium: 9.2
GFR calc non Af Amer: >60
Glucose, Bld: 92
Potassium: 3.2 — ABNORMAL LOW
Sodium: 136

## 2011-01-03 LAB — POCT PREGNANCY, URINE: Preg Test, Ur: NEGATIVE

## 2011-01-09 ENCOUNTER — Emergency Department (INDEPENDENT_AMBULATORY_CARE_PROVIDER_SITE_OTHER): Payer: BC Managed Care – PPO

## 2011-01-09 ENCOUNTER — Emergency Department (HOSPITAL_BASED_OUTPATIENT_CLINIC_OR_DEPARTMENT_OTHER)
Admission: EM | Admit: 2011-01-09 | Discharge: 2011-01-09 | Disposition: A | Discharge status: Home / Self Care | Payer: BC Managed Care – PPO | Attending: Emergency Medicine | Admitting: Emergency Medicine

## 2011-01-09 ENCOUNTER — Encounter: Payer: Self-pay | Admitting: *Deleted

## 2011-01-09 DIAGNOSIS — E119 Type 2 diabetes mellitus without complications: Secondary | ICD-10-CM | POA: Insufficient documentation

## 2011-01-09 DIAGNOSIS — R0602 Shortness of breath: Secondary | ICD-10-CM

## 2011-01-09 DIAGNOSIS — J45909 Unspecified asthma, uncomplicated: Secondary | ICD-10-CM | POA: Insufficient documentation

## 2011-01-09 DIAGNOSIS — R079 Chest pain, unspecified: Secondary | ICD-10-CM | POA: Insufficient documentation

## 2011-01-09 HISTORY — DX: Sarcoidosis, unspecified: D86.9

## 2011-01-09 LAB — D-DIMER, QUANTITATIVE: D-Dimer, Quant: <0.22 ug/mL-FEU (ref 0.00–0.48)

## 2011-01-09 LAB — TROPONIN I: Troponin I: <0.3 ng/mL (ref <0.30)

## 2011-01-09 LAB — CBC
HCT: 35.5 % — ABNORMAL LOW (ref 36.0–46.0)
Hemoglobin: 12 g/dL (ref 12.0–15.0)
WBC: 11.9 10*3/uL — ABNORMAL HIGH (ref 4.0–10.5)

## 2011-01-09 MED ORDER — IBUPROFEN 600 MG PO TABS: 600.0000 mg | ORAL_TABLET | Freq: Four times a day (QID) | ORAL | Status: AC | PRN

## 2011-01-09 MED ORDER — HYDROCODONE-ACETAMINOPHEN 5-325 MG PO TABS: 1.0000 | ORAL_TABLET | ORAL | Status: AC | PRN

## 2011-01-09 MED ORDER — ALBUTEROL SULFATE (5 MG/ML) 0.5% IN NEBU
5.0000 mg | INHALATION_SOLUTION | Freq: Once | RESPIRATORY_TRACT | Status: AC
  Administered 2011-01-09: 5 mg via RESPIRATORY_TRACT
  Filled 2011-01-09: qty 1

## 2011-01-09 MED ORDER — KETOROLAC TROMETHAMINE 30 MG/ML IJ SOLN: 30.0000 mg | Freq: Once | INTRAMUSCULAR | Status: DC

## 2011-01-09 NOTE — ED Notes (Signed)
Pt sent here from PMD office for PE eval. PT c/o cp and sob x 2 days

## 2011-01-09 NOTE — ED Notes (Signed)
Pt refused pain med at present. 

## 2011-01-09 NOTE — ED Provider Notes (Signed)
History    Scribed for Lyanne Co, MD, the patient was seen in room MH10/MH10. This chart was scribed by Katha Cabal. This patient's care was started at 7:45 PM.   CSN: [id number] Arrival date & time: 01/09/2011  6:58 PM  Chief Complaint  Patient presents with  . Shortness of Breath  . Chest Pain    HPI GRAYCIE HALLEY is a 35 y.o. female who presents to the Emergency Department complaining of constant chest tightness with SOB that worsens upon exertion.  Chest tightness that began 2 days ago.  Patient has pleural pain with inspiration.  Denies recent lung travel, nausea, vomiting, and diaphoresis.  Patient was seen today at the walk in clinic and was referred to ER.  Patient is not a smoker.  No history of pulmonary embolism.  No history of acute coronary syndrome or MI.  Patient denies orthopnea.  Denies fever cough or productive cough.  Denies recent rash.  Denies recent trauma to her chest.  Nothing improves her symptoms    PAST MEDICAL HISTORY:  Past Medical History  Diagnosis Date  . Diabetes mellitus   . Asthma   . Sarcoidosis     PAST SURGICAL HISTORY:  Past Surgical History  Procedure Date  . Abdominal hysterectomy   . Cholecystectomy   . Cardiac surgery     FAMILY HISTORY:  History reviewed. No pertinent family history.   SOCIAL HISTORY: History   Social History  . Marital Status: Single    Spouse Name: N/A    Number of Children: 1 daughter  . Years of Education: N/A   Social History Main Topics  . Smoking status: Never Smoker   . Smokeless tobacco: None  . Alcohol Use: No  . Drug Use:   . Sexually Active:    Other Topics Concern  . None   Social History Narrative  . None    Review of Systems  All other systems reviewed and are negative.    Allergies  Review of patient's allergies indicates no known allergies.  Home Medications   Current Outpatient Rx  Name Route Sig Dispense Refill  . IPRATROPIUM-ALBUTEROL 18-103 MCG/ACT IN  AERO Inhalation Inhale 2 puffs into the lungs every 4 (four) hours as needed. For shortness of breath and wheezing     . GUAIFENESIN 600 MG PO TB12 Oral Take 1,200 mg by mouth 2 (two) times daily.        BP 133/76  Pulse 74  Temp(Src) 98.1 F (36.7 C) (Oral)  Resp 24  Ht 5' 1.5" (1.562 m)  Wt 276 lb (125.193 kg)  BMI 51.31 kg/m2  SpO2 100%  Physical Exam  Nursing note and vitals reviewed. Constitutional: She is oriented to person, place, and time. She appears well-developed and well-nourished. No distress.  HENT:  Head: Normocephalic and atraumatic.  Eyes: EOM are normal.  Neck: Normal range of motion.  Cardiovascular: Normal rate, regular rhythm and normal heart sounds.   Pulmonary/Chest: Effort normal and breath sounds normal. She exhibits tenderness.       Mild tenderness right lateral chest wall  Abdominal: Soft. She exhibits no distension. There is no tenderness.  Musculoskeletal: Normal range of motion.       Symmetric lower extremities, mild tenderness of right calf   Neurological: She is alert and oriented to person, place, and time.  Skin: Skin is warm and dry. She is not diaphoretic.  Psychiatric: She has a normal mood and affect. Judgment normal.    ED  Course  Procedures (including critical care time)   OTHER DATA REVIEWED: Nursing notes, vital signs, and past medical records reviewed.   DIAGNOSTIC STUDIES: Oxygen Saturation is 100% on room air, normal by my interpretation.    LABS / RADIOLOGY:  Labs Reviewed  CBC - Abnormal; Notable for the following:    WBC 11.9 (*)    HCT 35.5 (*)    All other components within normal limits  TROPONIN I  D-DIMER, QUANTITATIVE     Results for orders placed during the hospital encounter of 01/09/11  CBC      Component Value Range   WBC 11.9 (*) 4.0 - 10.5 (K/uL)   RBC 4.32  3.87 - 5.11 (MIL/uL)   Hemoglobin 12.0  12.0 - 15.0 (g/dL)   HCT 96.0 (*) 45.4 - 46.0 (%)   MCV 82.2  78.0 - 100.0 (fL)   MCH 27.8  26.0 -  34.0 (pg)   MCHC 33.8  30.0 - 36.0 (g/dL)   RDW 09.8  11.9 - 14.7 (%)   Platelets 351  150 - 400 (K/uL)  TROPONIN I      Component Value Range   Troponin I <0.30  <0.30 (ng/mL)  D-DIMER, QUANTITATIVE      Component Value Range   D-Dimer, Quant <0.22  0.00 - 0.48 (ug/mL-FEU)     Dg Chest 2 View  01/09/2011  *RADIOLOGY REPORT*  Clinical Data: Chest pain.Nonsmoker.  CHEST - 2 VIEW  Comparison: 06/28/2010.  Findings: Linear structure peripheral aspect left upper lung zone probably represents overlying structure rather than pneumothorax.  Mediastinal and cardiac silhouette stable and within normal limits.  No infiltrate.  IMPRESSION: No acute abnormality.  Please see above.  Original Report Authenticated By: Fuller Canada, M.D.     ED COURSE / COORDINATION OF CARE: 7:51 PM  Physical exam complete.  Will order D-Dimer.   8:31 PM  Radiological impression: no acute abnormality found in CXR.  9:06 PM  Plan to discharge patient home.   Orders Placed This Encounter  Procedures  . DG Chest 2 View  . CBC  . Troponin I  . D-dimer, quantitative  . ED EKG  . Saline lock IV  . Saline lock IV    Date: 01/09/2011  Rate: 80  Rhythm: normal sinus rhythm  QRS Axis: normal  Intervals: normal  ST/T Wave abnormalities: normal  Conduction Disutrbances:none  Narrative Interpretation:   Old EKG Reviewed: unchanged         MDM   MDM: This is well appearing with symmetric lower extremities.  My suspicion for DVT is very low.  Her d-dimer is negative and therefore will not pursue the diagnosis of pulmonary embolism further.  EKG is normal.  Troponin is normal.  Doubt ACS.  Chest x-ray without evidence of pneumonia or pneumothorax.  Discharge home with pain medicine for likely musculoskeletal chest pain.   IMPRESSION: Diagnoses that have been ruled out:  Diagnoses that are still under consideration:  Final diagnoses:     MEDICATIONS GIVEN IN THE E.D. Scheduled Meds:    . albuterol   5 mg Nebulization Once  . ketorolac  30 mg Intravenous Once   Continuous Infusions:     DISCHARGE MEDICATIONS: New Prescriptions   No medications on file     I personally performed the services described in this documentation, which was scribed in my presence. The recorded information has been reviewed and considered.            Vania Rea  Patria Mane, MD 01/09/11 2110

## 2011-01-09 NOTE — ED Notes (Signed)
Pt reports onset of right side CP-radiates into right side of neck and back-started 4 days ago during physical activity-pt reports n/sob/diaphoresis with onset-intermittent and worse CP/SOB with exertion-feels like a heaviness rates 6/10 at present-NAD at present

## 2011-01-12 LAB — COMPREHENSIVE METABOLIC PANEL
ALT: 19
AST: 26
Albumin: 3.9
CO2: 26
Calcium: 9.3
Chloride: 104
GFR calc Af Amer: >60
GFR calc non Af Amer: >60
Sodium: 140

## 2011-01-12 LAB — DIFFERENTIAL
Eosinophils Absolute: 0.4
Eosinophils Relative: 3
Lymphocytes Relative: 25
Lymphs Abs: 3.3
Monocytes Absolute: 0.6

## 2011-01-12 LAB — D-DIMER, QUANTITATIVE: D-Dimer, Quant: <0.22

## 2011-01-12 LAB — POCT PREGNANCY, URINE: Preg Test, Ur: NEGATIVE

## 2011-01-12 LAB — URINALYSIS, ROUTINE W REFLEX MICROSCOPIC
Leukocytes, UA: NEGATIVE
Nitrite: NEGATIVE
Protein, ur: NEGATIVE
Specific Gravity, Urine: 1.011
Urobilinogen, UA: 0.2

## 2011-01-12 LAB — URINE MICROSCOPIC-ADD ON

## 2011-01-12 LAB — CBC
MCHC: 33.3
Platelets: 406 — ABNORMAL HIGH
RBC: 4.52
WBC: 13.2 — ABNORMAL HIGH

## 2011-01-12 LAB — POCT CARDIAC MARKERS
Operator id: 272551
Troponin i, poc: <0.05

## 2011-01-12 LAB — LIPASE, BLOOD: Lipase: 29

## 2011-01-13 LAB — DIFFERENTIAL
Eosinophils Absolute: 0.3
Eosinophils Relative: 3
Lymphocytes Relative: 35
Lymphs Abs: 4.6 — ABNORMAL HIGH
Monocytes Absolute: 0.7

## 2011-01-13 LAB — URINALYSIS, ROUTINE W REFLEX MICROSCOPIC
Ketones, ur: NEGATIVE
Leukocytes, UA: NEGATIVE
Protein, ur: NEGATIVE
Specific Gravity, Urine: 1.021

## 2011-01-13 LAB — CBC
HCT: 36.1
Hemoglobin: 12.1
MCV: 80.5
RDW: 14.9 — ABNORMAL HIGH
WBC: 13.3 — ABNORMAL HIGH

## 2011-01-13 LAB — HEPATIC FUNCTION PANEL
ALT: 25
Alkaline Phosphatase: 78
Bilirubin, Direct: 0.2
Indirect Bilirubin: 0.4

## 2011-01-13 LAB — BASIC METABOLIC PANEL
BUN: 14
Chloride: 106
GFR calc non Af Amer: 58 — ABNORMAL LOW
Glucose, Bld: 88
Potassium: 4.2
Sodium: 140

## 2011-01-13 LAB — LIPASE, BLOOD: Lipase: 29

## 2011-01-17 LAB — BASIC METABOLIC PANEL
BUN: 10
Calcium: 9.2
Creatinine, Ser: 0.82
GFR calc non Af Amer: >60
Glucose, Bld: 96

## 2011-01-17 LAB — DIFFERENTIAL
Basophils Absolute: 0
Eosinophils Relative: 4
Lymphocytes Relative: 35
Neutro Abs: 6.1
Neutrophils Relative %: 56

## 2011-01-17 LAB — CBC
Platelets: 409 — ABNORMAL HIGH
RDW: 14.8 — ABNORMAL HIGH

## 2011-06-24 DIAGNOSIS — S93409A Sprain of unspecified ligament of unspecified ankle, initial encounter: Secondary | ICD-10-CM | POA: Insufficient documentation

## 2012-01-06 ENCOUNTER — Encounter (HOSPITAL_BASED_OUTPATIENT_CLINIC_OR_DEPARTMENT_OTHER): Payer: Self-pay | Admitting: *Deleted

## 2012-01-06 ENCOUNTER — Emergency Department (HOSPITAL_BASED_OUTPATIENT_CLINIC_OR_DEPARTMENT_OTHER)
Admission: EM | Admit: 2012-01-06 | Discharge: 2012-01-07 | Disposition: A | Discharge status: Home / Self Care | Payer: Self-pay | Attending: Emergency Medicine | Admitting: Emergency Medicine

## 2012-01-06 DIAGNOSIS — J320 Chronic maxillary sinusitis: Secondary | ICD-10-CM | POA: Insufficient documentation

## 2012-01-06 DIAGNOSIS — J45909 Unspecified asthma, uncomplicated: Secondary | ICD-10-CM | POA: Insufficient documentation

## 2012-01-06 DIAGNOSIS — Z9089 Acquired absence of other organs: Secondary | ICD-10-CM | POA: Insufficient documentation

## 2012-01-06 DIAGNOSIS — E119 Type 2 diabetes mellitus without complications: Secondary | ICD-10-CM | POA: Insufficient documentation

## 2012-01-06 DIAGNOSIS — H81399 Other peripheral vertigo, unspecified ear: Secondary | ICD-10-CM | POA: Insufficient documentation

## 2012-01-06 NOTE — ED Notes (Signed)
Pt c/o h/a nausea and dizziness x 4 days

## 2012-01-07 ENCOUNTER — Emergency Department (HOSPITAL_BASED_OUTPATIENT_CLINIC_OR_DEPARTMENT_OTHER): Payer: Self-pay

## 2012-01-07 MED ORDER — AMOXICILLIN 500 MG PO CAPS: 1000.0000 mg | ORAL_CAPSULE | Freq: Three times a day (TID) | ORAL | Status: AC

## 2012-01-07 MED ORDER — MECLIZINE HCL 50 MG PO TABS: 50.0000 mg | ORAL_TABLET | Freq: Three times a day (TID) | ORAL | Status: DC | PRN
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
## 2012-02-01 ENCOUNTER — Encounter (HOSPITAL_COMMUNITY): Payer: Self-pay | Admitting: *Deleted

## 2012-02-01 ENCOUNTER — Emergency Department (HOSPITAL_COMMUNITY)
Admission: EM | Admit: 2012-02-01 | Discharge: 2012-02-01 | Disposition: A | Discharge status: Home / Self Care | Payer: Self-pay | Attending: Emergency Medicine | Admitting: Emergency Medicine

## 2012-02-01 DIAGNOSIS — Z8619 Personal history of other infectious and parasitic diseases: Secondary | ICD-10-CM | POA: Insufficient documentation

## 2012-02-01 DIAGNOSIS — M79605 Pain in left leg: Secondary | ICD-10-CM

## 2012-02-01 DIAGNOSIS — M79609 Pain in unspecified limb: Secondary | ICD-10-CM | POA: Insufficient documentation

## 2012-02-01 DIAGNOSIS — J45909 Unspecified asthma, uncomplicated: Secondary | ICD-10-CM | POA: Insufficient documentation

## 2012-02-01 DIAGNOSIS — E119 Type 2 diabetes mellitus without complications: Secondary | ICD-10-CM | POA: Insufficient documentation

## 2012-02-01 LAB — D-DIMER, QUANTITATIVE: D-Dimer, Quant: <0.27 ug/mL-FEU (ref 0.00–0.48)

## 2012-02-01 NOTE — ED Provider Notes (Signed)
History     CSN: [id number]  Arrival date & time 02/01/12  2004   First MD Initiated Contact with Patient 02/01/12 2011      Chief Complaint  Patient presents with  . Leg Pain    (Consider location/radiation/quality/duration/timing/severity/associated sxs/prior treatment) HPI  Pt reports right upper thigh pain for 3 days. She says that it is a dull sharp pain. She has a friend who is an EMT and he told her that she needs to get checked for blood clots. The patient denies having any calf pain or noticing any swelling. No chest pains or shortness of breath.  She denies injuring it. She has a history of sarcoidosis which doesn't usually cause her pain. nad vss  Past Medical History  Diagnosis Date  . Diabetes mellitus   . Asthma   . Sarcoidosis     Past Surgical History  Procedure Date  . Abdominal hysterectomy   . Cholecystectomy   . Cardiac surgery     No family history on file.  History  Substance Use Topics  . Smoking status: Never Smoker   . Smokeless tobacco: Not on file  . Alcohol Use: No    OB History    Grav Para Term Preterm Abortions TAB SAB Ect Mult Living                  Review of Systems  Review of Systems  Gen: no weight loss, fevers, chills, night sweats  Eyes: no discharge or drainage, no occular pain or visual changes  Nose: no epistaxis or rhinorrhea  Mouth: no dental pain, no sore throat  Neck: no neck pain  Lungs:No wheezing, coughing or hemoptysis CV: no chest pain, palpitations, dependent edema or orthopnea  Abd: no abdominal pain, nausea, vomiting  GU: no dysuria or gross hematuria  MSK:  Right thigh pain Neuro: no headache, no focal neurologic deficits  Skin: no abnormalities Psyche: negative.   Allergies  Review of patient's allergies indicates no known allergies.  Home Medications   Current Outpatient Rx  Name Route Sig Dispense Refill  . IPRATROPIUM-ALBUTEROL 18-103 MCG/ACT IN AERO Inhalation Inhale 2 puffs into the  lungs every 4 (four) hours as needed. For shortness of breath and wheezing     . MECLIZINE HCL 50 MG PO TABS Oral Take 1 tablet (50 mg total) by mouth 3 (three) times daily as needed for dizziness. 30 tablet 0    BP 130/79  Pulse 109  Temp 98.3 F (36.8 C)  Resp 20  SpO2 98%  Physical Exam  Nursing note and vitals reviewed. Constitutional: She appears well-developed and well-nourished. No distress.  HENT:  Head: Normocephalic and atraumatic.  Eyes: Pupils are equal, round, and reactive to light.  Neck: Normal range of motion. Neck supple.  Cardiovascular: Normal rate and regular rhythm.   Pulmonary/Chest: Effort normal.  Abdominal: Soft.  Musculoskeletal:       Right hip: She exhibits tenderness. She exhibits normal range of motion, normal strength, no bony tenderness, no swelling, no crepitus, no deformity and no laceration.       Legs: Neurological: She is alert.  Skin: Skin is warm and dry.    ED Course  Procedures (including critical care time)   Labs Reviewed  D-DIMER, QUANTITATIVE   No results found.   1. Left leg pain       MDM  pts symptoms seen more nerve and muscular than vascular. I have low suspicion and do not want to start Lovenox  but i can not guarantee no clot. Pt had negative D-dimer 1 month ago.   D-dimer negative. Pt advised to use Ibuprofen and Tylenol for pain and follow-up with PCP  Pt has been advised of the symptoms that warrant their return to the ED. Patient has voiced understanding and has agreed to follow-up with the PCP or specialist.     Dorthula Matas, PA 02/01/12 2224

## 2012-02-01 NOTE — ED Notes (Signed)
Pt c/o right thigh pain x 3 days; no known injury; no swelling

## 2012-02-02 NOTE — ED Provider Notes (Signed)
Medical screening examination/treatment/procedure(s) were performed by non-physician practitioner and as supervising physician I was immediately available for consultation/collaboration.   Lyanne Co, MD 02/02/12 [phone]

## 2012-04-01 ENCOUNTER — Other Ambulatory Visit: Payer: Self-pay | Admitting: *Deleted

## 2012-08-04 ENCOUNTER — Emergency Department (HOSPITAL_BASED_OUTPATIENT_CLINIC_OR_DEPARTMENT_OTHER)
Admission: EM | Admit: 2012-08-04 | Discharge: 2012-08-04 | Disposition: A | Discharge status: Home / Self Care | Payer: BC Managed Care – PPO | Attending: Emergency Medicine | Admitting: Emergency Medicine

## 2012-08-04 ENCOUNTER — Encounter (HOSPITAL_BASED_OUTPATIENT_CLINIC_OR_DEPARTMENT_OTHER): Payer: Self-pay

## 2012-08-04 ENCOUNTER — Other Ambulatory Visit: Payer: Self-pay

## 2012-08-04 DIAGNOSIS — Z79899 Other long term (current) drug therapy: Secondary | ICD-10-CM | POA: Insufficient documentation

## 2012-08-04 DIAGNOSIS — I959 Hypotension, unspecified: Secondary | ICD-10-CM | POA: Insufficient documentation

## 2012-08-04 DIAGNOSIS — J45909 Unspecified asthma, uncomplicated: Secondary | ICD-10-CM | POA: Insufficient documentation

## 2012-08-04 DIAGNOSIS — R209 Unspecified disturbances of skin sensation: Secondary | ICD-10-CM | POA: Insufficient documentation

## 2012-08-04 DIAGNOSIS — Z8619 Personal history of other infectious and parasitic diseases: Secondary | ICD-10-CM | POA: Insufficient documentation

## 2012-08-04 DIAGNOSIS — E119 Type 2 diabetes mellitus without complications: Secondary | ICD-10-CM | POA: Insufficient documentation

## 2012-08-04 DIAGNOSIS — G44209 Tension-type headache, unspecified, not intractable: Secondary | ICD-10-CM | POA: Insufficient documentation

## 2012-08-04 MED ORDER — GI COCKTAIL ~~LOC~~
ORAL | Status: AC
  Administered 2012-08-04: 30 mL via ORAL
  Filled 2012-08-04: qty 30

## 2012-08-04 MED ORDER — GI COCKTAIL ~~LOC~~
30.0000 mL | Freq: Once | ORAL | Status: AC
  Administered 2012-08-04: 30 mL via ORAL

## 2012-08-04 MED ORDER — KETOROLAC TROMETHAMINE 60 MG/2ML IM SOLN
60.0000 mg | Freq: Once | INTRAMUSCULAR | Status: AC
  Administered 2012-08-04: 60 mg via INTRAMUSCULAR
  Filled 2012-08-04: qty 2

## 2012-08-04 MED ORDER — METHOCARBAMOL 500 MG PO TABS: 500.0000 mg | ORAL_TABLET | Freq: Two times a day (BID) | ORAL | Status: DC

## 2012-08-04 MED ORDER — NAPROXEN 500 MG PO TABS: 500.0000 mg | ORAL_TABLET | Freq: Two times a day (BID) | ORAL | Status: DC

## 2012-08-04 NOTE — ED Provider Notes (Signed)
History     CSN: [id number]  Arrival date & time 08/04/12  1532   First MD Initiated Contact with Patient 08/04/12 1540      Chief Complaint  Patient presents with  . Headache    (Consider location/radiation/quality/duration/timing/severity/associated sxs/prior treatment) HPI Comments: Patient presents emergency department with chief complaint of headache. She states that she was recently seen at [facility], and was told to come to the emergency department because she had experienced some hypotension while being monitored there. Patient states that she has had some left arm tingling and numbness. Her symptoms started today. She has not taken anything to alleviate her symptoms. She states that yesterday she experienced a brief episode of chest pain and shortness of breath while getting out of bed. She does not have any chest pain or shortness of breath today. She denies any fevers, chills, nausea, vomiting, or diaphoresis.  The history is provided by the patient. No language interpreter was used.    Past Medical History  Diagnosis Date  . Diabetes mellitus   . Asthma   . Sarcoidosis     Past Surgical History  Procedure Laterality Date  . Abdominal hysterectomy    . Cholecystectomy    . Cardiac surgery      History reviewed. No pertinent family history.  History  Substance Use Topics  . Smoking status: Never Smoker   . Smokeless tobacco: Not on file  . Alcohol Use: No    OB History   Grav Para Term Preterm Abortions TAB SAB Ect Mult Living                  Review of Systems  All other systems reviewed and are negative.    Allergies  Strawberry  Home Medications   Current Outpatient Rx  Name  Route  Sig  Dispense  Refill  . acetaminophen (TYLENOL) 500 MG tablet   Oral   Take 1,000 mg by mouth every 6 (six) hours as needed for pain.         Marland Kitchen albuterol-ipratropium (COMBIVENT) 18-103 MCG/ACT inhaler   Inhalation   Inhale 2 puffs into the  lungs every 4 (four) hours as needed. For shortness of breath and wheezing          . meclizine (ANTIVERT) 50 MG tablet   Oral   Take 1 tablet (50 mg total) by mouth 3 (three) times daily as needed for dizziness.   30 tablet   0     BP 142/86  Pulse 84  Temp(Src) 98.5 F (36.9 C) (Oral)  Resp 16  Ht 5\' 1"  (1.549 m)  Wt 281 lb 3.2 oz (127.551 kg)  BMI 53.16 kg/m2  SpO2 99%  Physical Exam  Nursing note and vitals reviewed. Constitutional: She is oriented to person, place, and time. She appears well-developed and well-nourished.  HENT:  Head: Normocephalic and atraumatic.  Right Ear: External ear normal.  Left Ear: External ear normal.  Non-tender over temporal artery, no increased pain with chewing.  Eyes: Conjunctivae and EOM are normal. Pupils are equal, round, and reactive to light.  No papilledema  Neck: Normal range of motion. Neck supple.  No pain with neck flexion, no meningismus  Cardiovascular: Normal rate, regular rhythm and normal heart sounds.  Exam reveals no gallop and no friction rub.   No murmur heard. Pulmonary/Chest: Effort normal and breath sounds normal. No respiratory distress. She has no wheezes. She has no rales. She exhibits no tenderness.  Abdominal: Soft.  Bowel sounds are normal. She exhibits no distension and no mass. There is no tenderness. There is no rebound and no guarding.  Musculoskeletal: Normal range of motion. She exhibits no edema and no tenderness.  Normal gait.  Neurological: She is alert and oriented to person, place, and time. She has normal reflexes.  CN 3-12 intact, no pronator drift, normal shin to heel, normal RAM, sensation and strength intact bilaterally.  Skin: Skin is warm and dry.  Psychiatric: She has a normal mood and affect. Her behavior is normal. Judgment and thought content normal.    ED Course  Procedures (including critical care time)  ED ECG REPORT  I personally interpreted this EKG   Date: 08/04/2012   Rate:  68  Rhythm: normal sinus rhythm  QRS Axis: normal  Intervals: normal  ST/T Wave abnormalities: normal  Conduction Disutrbances:none  Narrative Interpretation:   Old EKG Reviewed: unchanged     1. Tension headache       MDM  Patient with headache, and left arm tingling, the headache is worsened with palpation of the left upper trapezius and left cervical paraspinal muscles, I suspect that this is likely a tension headache. She states that the pain in her neck and upper back radiates to her left arm with some associated numbness and tingling. Otherwise, her neurologic exam is normal, she does not have any strength or sensation deficits on my exam. I believe her symptoms to be stemming from musculoskeletal tension headache in the upper neck and back. She did not have any fevers or chills. She did not have any chest pain or shortness of breath, or diaphoresis here. I discussed the patient with Dr. Anitra Lauth, who recommends that we obtain a screening EKG, which if normal we can discharge the patient to home. Very low suspicion for ACS given the patient's history and physical exam findings today. Uncertain of reported hypotension at [facility] family physicians, I doubt was a true hypotension, and she patient states that her blood pressure was 105/something and then 98/something. She denies any dizziness or syncope. If the screening EKG is normal, I'm going to give the patient a shot of Toradol, and will discharge her with a muscle relaxer and ibuprofen. Patient understands and agrees with the plan. She is stable and ready for discharge.  Patient states that she has been burping a lot lately, and thinks she might have had some acid reflux.  Will give GI cocktail.       Roxy Horseman, PA-C 08/04/12 1656

## 2012-08-04 NOTE — ED Notes (Signed)
Pt states that she has hx of migraine headaches, onset of headache this morning 1130 has taken 1000mg  tylenol with no relief.  Pt went to [facility] in clinic on [address], was told to come to ED because she experienced hypotension while being monitored there.  Also c/o numbness to the left side of her body, PMS intact bilaterally.

## 2012-08-05 NOTE — ED Provider Notes (Signed)
Medical screening examination/treatment/procedure(s) were performed by non-physician practitioner and as supervising physician I was immediately available for consultation/collaboration.   Gwyneth Sprout, MD 08/05/12 [phone]

## 2012-11-15 ENCOUNTER — Other Ambulatory Visit: Payer: Self-pay | Admitting: Orthopedic Surgery

## 2012-11-15 DIAGNOSIS — M79605 Pain in left leg: Secondary | ICD-10-CM

## 2012-11-15 DIAGNOSIS — M79604 Pain in right leg: Secondary | ICD-10-CM

## 2012-11-20 ENCOUNTER — Ambulatory Visit
Admission: RE | Admit: 2012-11-20 | Discharge: 2012-11-20 | Disposition: A | Discharge status: Home / Self Care | Payer: BC Managed Care – PPO | Source: Ambulatory Visit | Attending: Orthopedic Surgery | Admitting: Orthopedic Surgery

## 2012-11-20 DIAGNOSIS — M79604 Pain in right leg: Secondary | ICD-10-CM

## 2012-11-20 DIAGNOSIS — M79605 Pain in left leg: Secondary | ICD-10-CM

## 2013-02-06 ENCOUNTER — Other Ambulatory Visit: Payer: Self-pay

## 2013-04-15 ENCOUNTER — Encounter: Payer: Self-pay | Admitting: Neurology

## 2013-04-15 ENCOUNTER — Ambulatory Visit (INDEPENDENT_AMBULATORY_CARE_PROVIDER_SITE_OTHER): Payer: BC Managed Care – PPO | Admitting: Neurology

## 2013-04-15 VITALS — BP 110/68 | HR 78 | Temp 98.0°F | Ht 62.25 in | Wt 265.0 lb

## 2013-04-15 DIAGNOSIS — M79605 Pain in left leg: Principal | ICD-10-CM | POA: Insufficient documentation

## 2013-04-15 DIAGNOSIS — M79609 Pain in unspecified limb: Secondary | ICD-10-CM

## 2013-04-15 DIAGNOSIS — M79604 Pain in right leg: Secondary | ICD-10-CM | POA: Insufficient documentation

## 2013-04-15 NOTE — Patient Instructions (Signed)
EMG of the legs Return to clinic in 1 week for nerve block

## 2013-04-15 NOTE — Progress Notes (Signed)
[facility] Clinic Note - Initial Visit   Date: 04/15/2013    Denise Cook MRN: [id number] DOB: 04/30/1975   Dear Dr Trudie Reed:  Thank you for your kind referral of Denise Cook for consultation of paresthesias of her legs. Although her history is well known to you, please allow [place] to reiterate it for the purpose of our medical record.     History of Present Illness: Denise Cook is a 38 y.o. left-handed African American female with history of ocular sarcoidosis (diagnosed in 2012 at [facility], currently imuran 200mg  and prednisolone eye drops), asthma, positive PPD s/p INH and rifampin (2008), vitamin B12 deficiency, and diabetes mellitis (HbA1c 6.1) on presenting for evaluation of bilateral feet pain.  In 2013, she developed right leg pain after falling down her steps with associated swelling of the lower leg, but sparing the foot.  Pain is described as achy and involves only the anterior aspect of the leg along the bone, about 2 inches about the ankle and 4inches below the knee. It is worse with activity, prolonged standing or walking, and tender to touch.  She denies any numbness, tingling, or weakness.  She has tried a TENS unit without benefit.  Nothing seems to alleviate her pain.  Previous work-up has included orthopeadic evaluation who performed MRI of the leg without contrast and showed mild subcutaneous edema.  She was managed as possible tibial stress syndrome with therapy, applying ice/heat, elevating legs, and stockings without improvement.  She also complains of daily headache for the past two months. In November, she was interviewing for a new job and reports being very stressed.  She developed a headache at the base of her head, described as throbbing ache which has not improved since onset.  She has nausea, photophobia, and phonophobia. Headache is worse with increased stress and it is improved with rest and relaxation.  Denies morning  headaches or of symptoms with changes in position. She has tried toradol injection, tramadol, hydrocodone, and exedrin migraine without any relief.     Of note, the dose of imuran was increased about 40-months ago from 150mg  to 200mg .    Out-side paper records, electronic medical record, and images have been reviewed where available and summarized as:  MRI of the lower legs 11/15/2012:  Negative MRI of the right leg aside from mild subcutaneous edema. Negative for stress fracture or stress reaction.  CT head wo contrast 01/07/2012: 1. Acute bilateral maxillary sinusitis.  2. No acute intracranial abnormality.  MRI cervical spine 12/15/2007:  negative  MRI brain wwo contrast 08/08/2007:  1. No acute intracranial abnormality.  2. Somewhat small maxillary sinuses bilaterally, right smaller than left. This may be the sequela of chronic sinusitis.     Past Medical History  Diagnosis Date  . Diabetes mellitus   . Asthma   . Sarcoidosis 2012  . Obesity     Past Surgical History  Procedure Laterality Date  . Abdominal hysterectomy    . Cholecystectomy    . Cardiac surgery    . Left eye surgery       Medications:  Current Outpatient Prescriptions on File Prior to Visit  Medication Sig Dispense Refill  . acetaminophen (TYLENOL) 500 MG tablet Take 1,000 mg by mouth every 6 (six) hours as needed for pain.      Marland Kitchen albuterol-ipratropium (COMBIVENT) 18-103 MCG/ACT inhaler Inhale 2 puffs into the lungs every 4 (four) hours as needed. For shortness of breath and wheezing       .  meclizine (ANTIVERT) 50 MG tablet Take 1 tablet (50 mg total) by mouth 3 (three) times daily as needed for dizziness.  30 tablet  0  . methocarbamol (ROBAXIN) 500 MG tablet Take 1 tablet (500 mg total) by mouth 2 (two) times daily.  20 tablet  0   No current facility-administered medications on file prior to visit.    Allergies:  Allergies  Allergen Reactions  . Metformin And Related Itching  . Strawberry Itching  and Swelling    Family History: Family History  Problem Relation Age of Onset  . Adopted: Yes  . Healthy Daughter   . Cancer Maternal Grandmother   . Alcoholism Mother   . Diabetes Paternal Grandmother     Social History: History   Social History  . Marital Status: Single    Spouse Name: N/A    Number of Children: N/A  . Years of Education: N/A   Occupational History  . Not on file.   Social History Main Topics  . Smoking status: Never Smoker   . Smokeless tobacco: Never Used  . Alcohol Use: No  . Drug Use: No  . Sexual Activity: Yes    Birth Control/ Protection: Surgical, None   Other Topics Concern  . 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   She lives alone.  She has one grown daughter (69).    Review of Systems:  CONSTITUTIONAL: No fevers, chills, night sweats, + 20lb intentional weight loss in 66-months EYES: No visual changes or eye pain ENT: No hearing changes.  No history of nose bleeds.   RESPIRATORY: No cough, wheezing and shortness of breath.   CARDIOVASCULAR: Negative for chest pain, and palpitations.   GI: Negative for abdominal discomfort, blood in stools or black stools.  No recent change in bowel habits.   GU:  No history of incontinence.   MUSCLOSKELETAL: + history of joint pain or swelling.  No myalgias.   SKIN: Negative for lesions, rash, and itching.   HEMATOLOGY/ONCOLOGY: Negative for prolonged bleeding, bruising easily, and swollen nodes.     ENDOCRINE: Negative for cold or heat intolerance, polydipsia or goiter.   PSYCH:  No depression or anxiety symptoms.   NEURO: As Above.   Vital Signs:  BP 110/68  Pulse 78  Temp(Src) 98 F (36.7 C)  Ht 5' 2.25" (1.581 m)  Wt 265 lb (120.203 kg)  BMI 48.09 kg/m2   General Medical Exam:   General:  Well appearing, comfortable.   Eyes/ENT: see cranial nerve examination.    Neck: No masses appreciated.  Tenderness to palpation over the right base of the head with reproduction of sharp shooting pain radiating into the top of the head. Full range of motion without tenderness.  No carotid bruits. Respiratory:  Clear to auscultation, good air entry bilaterally.   Cardiac:  Regular rate and rhythm, no murmur.   GI:  Soft, non-tender, non-distended abdomen.  Bowel sounds normal. No masses, organomegaly.   Back:  No pain to palpation of spinous processes.   Extremities:  No deformities, edema, or skin discoloration. Good capillary refill.  Tenderness over a very localized area of the anterior lower legs bilaterally Skin:  Skin color, texture, turgor normal. No rashes or lesions.  Neurological Exam: MENTAL STATUS including orientation to time, place, person, recent and remote memory, attention span and concentration, language, and fund of knowledge is  normal.  Speech is not dysarthric.  CRANIAL NERVES: II:  No visual field defects.  Limited funduscopic examination due to nondilated eye.   III-IV-VI: Pupils equal round and reactive to light, left eye is surgical.  Normal conjugate, extra-ocular eye movements in all directions of gaze.  No nystagmus.  Subtle left ptosis.   V:  Normal facial sensation.     VII:  Normal facial symmetry and movements.    VIII:  Normal hearing and vestibular function.   IX-X:  Normal palatal movement.   XI:  Normal shoulder shrug and head rotation.   XII:  Normal tongue strength and range of motion, no deviation or fasciculation.  MOTOR:  No atrophy, fasciculations or abnormal movements.  No pronator drift.  Tone is normal.    Right Upper Extremity:    Left Upper Extremity:    Deltoid  5/5   Deltoid  5/5   Biceps  5/5   Biceps  5/5   Triceps  5/5   Triceps  5/5   Wrist extensors  5/5   Wrist extensors  5/5   Wrist flexors  5/5   Wrist flexors  5/5   Finger extensors  5/5   Finger extensors  5/5   Finger flexors  5/5   Finger flexors   5/5   Dorsal interossei  5/5   Dorsal interossei  5/5   Abductor pollicis  5/5   Abductor pollicis  5/5   Tone (Ashworth scale)  0  Tone (Ashworth scale)  0   Right Lower Extremity:    Left Lower Extremity:    Hip flexors  5/5   Hip flexors  5/5   Hip extensors  5/5   Hip extensors  5/5   Knee flexors  5/5   Knee flexors  5/5   Knee extensors  5/5   Knee extensors  5/5   Dorsiflexors  5/5   Dorsiflexors  5/5   Plantarflexors  5/5   Plantarflexors  5/5   Toe extensors  5/5   Toe extensors  5/5   Toe flexors  5/5   Toe flexors  5/5   Tone (Ashworth scale)  0  Tone (Ashworth scale)  0   MSRs:  Right                                                                 Left brachioradialis 2+  brachioradialis 2+  biceps 2+  biceps 2+  triceps 2+  triceps 2+  patellar 2+  patellar 2+  ankle jerk 2+  ankle jerk 2+  Hoffman no  Hoffman no  plantar response down  plantar response down   SENSORY:  Normal and symmetric perception of light touch, pinprick, vibration, and proprioception.  Romberg's sign absent.   COORDINATION/GAIT: Normal finger-to- nose-finger and heel-to-shin.  Intact rapid alternating movements bilaterally.  Able to rise from a chair without using arms.  Gait narrow based and stable. Tandem and stressed gait intact.    IMPRESSION: Denise Cook is a 38 year-old female with a history of ocular sarcoidosis presenting for evaluation of bilateral leg pain and headaches.  Her neurological examination is nonfocal. Her motor strength, sensation to all modalities, and reflexes are normal.  Her history and exam does not have typical features  of neuropathy or myopathy since the distribution of pain does not conform to a peripheral nerve or dermatomal pattern. Because sarcoidosis can affect multiple organ systems, I would like to better characterize her pain with an EMG of the lower extremities.  If there is evidence of neuropathy, I will screen her for neuropathy labs.  By history and exam,  her symptoms seem more musculoskeletal origin, but let's see what the EMG shows.  She also has right occipital neuralgia.  She has tried toradol injection, tramadol, hydrocodone, and exedrin migraine without any relief.   Management options including nerve block was discussed which she is agreeable to having.  Contrasted imaging of the brain may be considered going forward, if there is no improvement with nerve block.   PLAN/RECOMMENDATIONS:  EMG of the right > left leg Return to clinic in 1-week for occipital nerve block   The duration of this appointment visit was 60 minutes of face-to-face time with the patient.  Greater than 50% of this time was spent in counseling, explanation of diagnosis, planning of further management, and coordination of care.   Thank you for allowing me to participate in patient's care.  If I can answer any additional questions, I would be pleased to do so.    Sincerely,    Garrell Flagg K. Posey Pronto, DO

## 2013-04-15 NOTE — Progress Notes (Signed)
faxed

## 2013-04-21 ENCOUNTER — Encounter: Payer: Self-pay | Admitting: Neurology

## 2013-04-21 ENCOUNTER — Ambulatory Visit (INDEPENDENT_AMBULATORY_CARE_PROVIDER_SITE_OTHER): Payer: BC Managed Care – PPO | Admitting: Neurology

## 2013-04-21 VITALS — BP 100/64 | HR 78 | Temp 97.6°F | Ht 61.0 in | Wt 262.0 lb

## 2013-04-21 DIAGNOSIS — M5481 Occipital neuralgia: Secondary | ICD-10-CM

## 2013-04-21 DIAGNOSIS — M531 Cervicobrachial syndrome: Secondary | ICD-10-CM

## 2013-04-21 DIAGNOSIS — G932 Benign intracranial hypertension: Secondary | ICD-10-CM | POA: Insufficient documentation

## 2013-04-21 NOTE — Progress Notes (Signed)
Patient here today for right  greater and lesser occipital nerve block for chronic migraine and occipital neuralgia. This is her first injection. Will proceed with nerve block on right side, which is most symptomatic (see procedure note). Risks and benefits discussed.   Donika K. Posey Pronto, DO

## 2013-04-21 NOTE — Procedures (Signed)
Procedure: Occipital nerve block  Procedure risks, benefits and alternative treatments explained to patient and they agree to proceed.   A concentration of 0.25% bupivacaine (3 ml) was mixed with 40 mg of Kenalog (1 ml). A 27 gauge needle was used for injection. The region of the right greater and lesser occipital nerve was located by palpation. The area was prepped with alcohol. A total of 1.5 cc of the above mixture was injected without difficulty to each site. The patient tolerated the procedure well.    Donika K. Posey Pronto, DO 04/21/2013

## 2013-04-24 ENCOUNTER — Other Ambulatory Visit: Payer: Self-pay | Admitting: *Deleted

## 2013-04-24 ENCOUNTER — Encounter: Payer: Self-pay | Admitting: Neurology

## 2013-04-24 DIAGNOSIS — R519 Headache, unspecified: Secondary | ICD-10-CM

## 2013-04-24 DIAGNOSIS — R51 Headache: Principal | ICD-10-CM

## 2013-05-07 ENCOUNTER — Telehealth: Payer: Self-pay | Admitting: Neurology

## 2013-05-07 ENCOUNTER — Other Ambulatory Visit: Payer: Self-pay | Admitting: Neurology

## 2013-05-07 ENCOUNTER — Ambulatory Visit (HOSPITAL_COMMUNITY)
Admission: RE | Admit: 2013-05-07 | Discharge: 2013-05-07 | Disposition: A | Discharge status: Home / Self Care | Payer: BC Managed Care – PPO | Source: Ambulatory Visit | Attending: Neurology | Admitting: Neurology

## 2013-05-07 DIAGNOSIS — R519 Headache, unspecified: Secondary | ICD-10-CM

## 2013-05-07 DIAGNOSIS — R51 Headache: Secondary | ICD-10-CM | POA: Insufficient documentation

## 2013-05-07 DIAGNOSIS — R110 Nausea: Secondary | ICD-10-CM | POA: Insufficient documentation

## 2013-05-07 DIAGNOSIS — R42 Dizziness and giddiness: Secondary | ICD-10-CM | POA: Insufficient documentation

## 2013-05-07 DIAGNOSIS — E236 Other disorders of pituitary gland: Secondary | ICD-10-CM | POA: Insufficient documentation

## 2013-05-07 MED ORDER — GADOBENATE DIMEGLUMINE 529 MG/ML IV SOLN
20.0000 mL | Freq: Once | INTRAVENOUS | Status: AC | PRN
  Administered 2013-05-07: 20 mL via INTRAVENOUS

## 2013-05-07 NOTE — Telephone Encounter (Signed)
Called patient with results of MRI brain which show signs partially empty sella configuration, which can be seen in idiopathic intracranial hypertension.  She continues to have headaches so will proceed with CSF testing for opening pressure and analysis.  She also has a history of sarcoidosis and I want to check CSF ACE levels for neurosarcoidosis.  She is in agreement of plan.  Lukus Binion K. Posey Pronto, DO

## 2013-05-07 NOTE — Telephone Encounter (Signed)
Orders entered into EPIC. Will call to schedule in the morning and contact patient.

## 2013-05-08 ENCOUNTER — Encounter: Payer: Self-pay | Admitting: Neurology

## 2013-05-08 ENCOUNTER — Ambulatory Visit (INDEPENDENT_AMBULATORY_CARE_PROVIDER_SITE_OTHER): Payer: BC Managed Care – PPO | Admitting: Neurology

## 2013-05-08 DIAGNOSIS — M79609 Pain in unspecified limb: Secondary | ICD-10-CM

## 2013-05-08 DIAGNOSIS — M79604 Pain in right leg: Secondary | ICD-10-CM

## 2013-05-08 DIAGNOSIS — M79605 Pain in left leg: Principal | ICD-10-CM

## 2013-05-08 NOTE — Telephone Encounter (Signed)
Lumbar puncture scheduled at [facility] on 05/14/2013 at 10:00 am to arrive at 9:00 am. Patient to be NPO after midnight needs to have a driver. Will call patient with appt later today.

## 2013-05-08 NOTE — Procedures (Signed)
[facility]  [place], [address]  [place], [place] [address] Tel: [phone] Fax:  [phone] Test Date:  05/08/2013  Patient: Denise Cook DOB: [birth year] Physician: Narda Amber, DO  Sex: Female Height: 5' 1.5" Ref Phys: Narda Amber  ID#: [id number] Temp: 33.2C Technician:    Patient Complaints: This is a 38 year-old  female presenting with right leg achy pain.   NCV & EMG Findings: Extensive evaluation of the right lower extremity reveals: 1. Normal sural and superficial peroneal sensory responses. 2. Normal tibial and peroneal motor responses. 3. There is no evidence of active for chronic motor axonal loss involving the tested muscles.  Impression: This is a normal electrodiagnostic study of the right lower extremity.  In particular, there is no evidence of a large fiber peripheral neuropathy or lumbosacral radiculopathy.   ___________________________ Narda Amber, DO    Nerve Conduction Studies Anti Sensory Summary Table   Site NR Peak (ms) Norm Peak (ms) P-T Amp (V) Norm P-T Amp  Right Sup Peroneal Anti Sensory (Ant Lat Mall)  12 cm    3.0 <4.5 6.2 >5  Right Sural Anti Sensory (Lat Mall)  Calf    2.8 <4.5 15.6 >5   Motor Summary Table   Site NR Onset (ms) Norm Onset (ms) O-P Amp (mV) Norm O-P Amp Site1 Site2 Delta-0 (ms) Dist (cm) Vel (m/s) Norm Vel (m/s)  Right Peroneal Motor (Ext Dig Brev)  Ankle    3.8 <5.5 5.7 >3 B Fib Ankle 6.0 32.0 53 >40  B Fib    9.8  5.4  Poplt B Fib 1.5 9.0 60 >40  Poplt    11.3  5.3         Right Peroneal TA Motor (Tib Ant)  Fib Head    2.2 <4.0 7.9 >4 Poplit Fib Head 1.6 10.0 63 >40  Poplit    3.8  7.6         Right Tibial Motor (Abd Hall Brev)  Ankle    5.2 <6.0 8.1 >8 Knee Ankle 5.8 39.0 67 >40  Knee    11.0  6.9          EMG   Side Muscle Ins Act Fibs Psw Fasc Number Recrt Dur Dur. Amp Amp. Poly Poly. Comment  Right AntTibialis Nml Nml Nml Nml Nml Nml Nml Nml Nml Nml Nml Nml N/A  Right Gastroc  Nml Nml Nml Nml Nml Nml Nml Nml Nml Nml Nml Nml N/A  Right Flex Dig Long Nml Nml Nml Nml Nml Nml Nml Nml Nml Nml Nml Nml N/A  Right VastusLat Nml Nml Nml Nml Nml Nml Nml Nml Nml Nml Nml Nml N/A  Right Biceps femoris SH Nml Nml Nml Nml Nml Nml Nml Nml Nml Nml Nml Nml N/A      Waveforms:

## 2013-05-08 NOTE — Progress Notes (Signed)
See procedure note for EMG results.  Donika K. Patel, DO  

## 2013-05-08 NOTE — Telephone Encounter (Signed)
Patient made aware of date and time and instructions for lumbar puncture.

## 2013-05-12 MED ORDER — TIZANIDINE HCL 2 MG PO CAPS: 2.0000 mg | ORAL_CAPSULE | Freq: Two times a day (BID) | ORAL | Status: DC | PRN

## 2013-05-12 NOTE — Addendum Note (Signed)
Addended byAnnamaria Helling on: 05/12/2013 12:29 PM   Modules accepted: Orders

## 2013-05-12 NOTE — Addendum Note (Signed)
Addended by: Alda Berthold on: 05/12/2013 12:09 PM   Modules accepted: Orders

## 2013-05-13 ENCOUNTER — Other Ambulatory Visit: Payer: Self-pay | Admitting: Radiology

## 2013-05-14 ENCOUNTER — Encounter (HOSPITAL_COMMUNITY): Payer: Self-pay

## 2013-05-14 ENCOUNTER — Ambulatory Visit (HOSPITAL_COMMUNITY)
Admission: RE | Admit: 2013-05-14 | Discharge: 2013-05-14 | Disposition: A | Discharge status: Home / Self Care | Payer: BC Managed Care – PPO | Source: Ambulatory Visit | Attending: Neurology | Admitting: Neurology

## 2013-05-14 ENCOUNTER — Telehealth: Payer: Self-pay | Admitting: Neurology

## 2013-05-14 DIAGNOSIS — M79609 Pain in unspecified limb: Secondary | ICD-10-CM | POA: Insufficient documentation

## 2013-05-14 DIAGNOSIS — D869 Sarcoidosis, unspecified: Secondary | ICD-10-CM | POA: Insufficient documentation

## 2013-05-14 DIAGNOSIS — R519 Headache, unspecified: Secondary | ICD-10-CM

## 2013-05-14 DIAGNOSIS — R51 Headache: Secondary | ICD-10-CM | POA: Insufficient documentation

## 2013-05-14 LAB — CSF CELL COUNT WITH DIFFERENTIAL
RBC Count, CSF: 0 /mm3
Tube #: 3
WBC, CSF: 0 /mm3 (ref 0–5)

## 2013-05-14 LAB — PROTEIN, CSF: Total  Protein, CSF: 27 mg/dL (ref 15–45)

## 2013-05-14 LAB — GLUCOSE, CSF: Glucose, CSF: 53 mg/dL (ref 43–76)

## 2013-05-14 NOTE — Discharge Instructions (Signed)
Myelography °Care After °These instructions give you information on caring for yourself after your procedure. Your doctor may also give you specific instructions. Call your doctor if you have any problems or questions after your procedure. °HOME CARE °· Rest often the first day. °· When you rest, lie flat, with your head slightly raised (elevated). °· Avoid heavy lifting and activity for 48 hours. °· You may take the bandage (dressing) off 1 day after the test. °GET HELP RIGHT AWAY IF:  °· You have a very bad headache. °· You have a fever. °MAKE SURE YOU: °· Understand these instructions. °· Will watch your condition. °· Will get help right away if you are not doing well or get worse. °Document Released: 12/28/2007 Document Revised: 03/06/2012 Document Reviewed: 12/13/2011 °ExitCare® Patient Information ©2014 [employer]. ° °

## 2013-05-14 NOTE — Procedures (Signed)
CLINICAL DATA: [Ocular sarcoid. Bilateral lower extremity pain and headaches.] EXAM: DIAGNOSTIC LUMBAR PUNCTURE UNDER FLUOROSCOPIC GUIDANCE FLUOROSCOPY TIME: [0 min 33 seconds.] PROCEDURE: Informed consent was obtained from the patient prior to the procedure, including potential complications of headache, allergy, and pain. With the patient prone, the lower back was prepped with Betadine. 1% Lidocaine was used for local anesthesia. Lumbar puncture was performed at the [L4-5] level using a [18] gauge needle with return of [initially blood tinged but then clear] CSF with an opening pressure of [30] cm water (obtained in the prone position due to large body habitus). [11.5]ml of CSF were obtained for laboratory studies. The patient tolerated the procedure well and there were no apparent complications. IMPRESSION: [Successful lumbar puncture under fluoroscopy.]

## 2013-05-14 NOTE — Progress Notes (Signed)
Discharge instruction given per MD order.  Pt and CG able to verbalize understanding.  Pt to car via wheelchair. 

## 2013-05-14 NOTE — Telephone Encounter (Signed)
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  She is scheduled to see me on Friday for follow-up at which time we can discuss management options.  Glenyce Randle K. Posey Pronto, DO

## 2013-05-14 NOTE — Progress Notes (Signed)
Received pt from [facility] procedure alert and denies any discomfort.

## 2013-05-16 ENCOUNTER — Telehealth: Payer: Self-pay | Admitting: Neurology

## 2013-05-16 ENCOUNTER — Ambulatory Visit (INDEPENDENT_AMBULATORY_CARE_PROVIDER_SITE_OTHER): Payer: BC Managed Care – PPO | Admitting: Neurology

## 2013-05-16 ENCOUNTER — Encounter: Payer: Self-pay | Admitting: Neurology

## 2013-05-16 VITALS — BP 98/70 | HR 72 | Temp 98.5°F | Ht 62.6 in | Wt 260.5 lb

## 2013-05-16 DIAGNOSIS — G932 Benign intracranial hypertension: Secondary | ICD-10-CM

## 2013-05-16 LAB — MYELIN BASIC PROTEIN, CSF: Myelin Basic Protein: <2 mcg/L (ref 0.0–4.0)

## 2013-05-16 LAB — ANGIOTENSIN CONVERTING ENZYME, CSF: Angio Convert Enzyme: 1 U/L (ref <=15)

## 2013-05-16 MED ORDER — ACETAZOLAMIDE 250 MG PO TABS: 250.0000 mg | ORAL_TABLET | Freq: Two times a day (BID) | ORAL | Status: DC

## 2013-05-16 NOTE — Progress Notes (Signed)
Note faxed.

## 2013-05-16 NOTE — Telephone Encounter (Signed)
Called pt back and got voicemail.  LM for her to call back.

## 2013-05-16 NOTE — Telephone Encounter (Signed)
Pt called to let you know that her pharmacy will not be able to get the Diamox until Monday.  She will start it then.

## 2013-05-16 NOTE — Telephone Encounter (Signed)
Pt called wanting to speak to a nurse regarding her meds.

## 2013-05-16 NOTE — Patient Instructions (Addendum)
1.  Encouraged weight loss 2.  Start Diamox 250mg  twice daily for one week, if tolerating, increase to 2 tablets twice daily (goal 500mg  twice daily). 3.  Visual field testing in April 4.  Return in 59-month

## 2013-05-16 NOTE — Progress Notes (Signed)
Follow-up Visit   Date: 05/16/2013    AMAREA FOX MRN: [id number] DOB: [birth year]   Interim History: Denise Cook is a 38 y.o. left-handed African American female with history of  ocular sarcoidosis (diagnosed in 2012 at [facility], currently imuran 200mg  and prednisolone eye drops), asthma, positive PPD s/p INH and rifampin (2008), vitamin B12 deficiency, and diabetes mellitis (HbA1c 6.1) returning to the clinic for follow-up of bilateral feet pain and headaches.  The patient was accompanied to the clinic by self.  History of present illness: In 2013, she developed right leg pain after falling down her steps with associated swelling of the lower leg, but sparing the foot. Pain is described as achy and involves only the anterior aspect of the leg along the bone, about 2 inches about the ankle and 4inches below the knee. It is worse with activity, prolonged standing or walking, and tender to touch. She denies any numbness, tingling, or weakness. She has tried a TENS unit without benefit. Nothing seems to alleviate her pain. Previous work-up has included orthopeadic evaluation who performed MRI of the leg without contrast and showed mild subcutaneous edema. She was managed as possible tibial stress syndrome with therapy, applying ice/heat, elevating legs, and stockings without improvement.   She also complains of daily headache for the past two months. In November, she was interviewing for a new job and reports being very stressed. She developed a headache at the base of her head, described as throbbing ache which has not improved since onset. She has nausea, photophobia, and phonophobia. Headache is worse with increased stress and it is improved with rest and relaxation. Denies morning headaches or of symptoms with changes in position. She has tried toradol injection, tramadol, hydrocodone, and exedrin migraine without any relief.   Of note, the dose of imuran was increased about  73-months ago from 150mg  to 200mg .   - Follow-up 05/16/2013:  She had a GON block on 1/19 for headaches without significant benefits.  Subsequently MRI of the brain was done which was concerning for pseudotumor cerebri which was confirmed by LP which showed an OP of 29mmHg.  She did not have any change in headaches following LP.  EMG of the right leg was normal.  She saw her ophthalmologist (Dr. Nehemiah Massed) who did not find any evidence of increased IOP.  Headaches remain unchanged.      Medications:  Current Outpatient Prescriptions on File Prior to Visit  Medication Sig Dispense Refill  . acetaminophen (TYLENOL) 500 MG tablet Take 1,000 mg by mouth every 6 (six) hours as needed for pain.      Marland Kitchen albuterol-ipratropium (COMBIVENT) 18-103 MCG/ACT inhaler Inhale 2 puffs into the lungs every 4 (four) hours as needed. For shortness of breath and wheezing       . azaTHIOprine (IMURAN) 50 MG tablet Take 200 mg by mouth at bedtime.       . Cyanocobalamin (B-12) 1000 MCG CAPS Take 1,000 mcg by mouth daily.       . diclofenac (VOLTAREN) 75 MG EC tablet Take 75 mg by mouth 2 (two) times daily.      . dorzolamide-timolol (COSOPT) 22.3-6.8 MG/ML ophthalmic solution 1 drop 2 (two) times daily.      . furosemide (LASIX) 40 MG tablet Take 40 mg by mouth daily as needed for fluid.       Marland Kitchen ketorolac (ACULAR) 0.4 % SOLN Place 1 drop into both eyes 4 (four) times daily.       Marland Kitchen  Liraglutide (VICTOZA) 18 MG/3ML SOPN Inject 1.2 mLs into the skin daily.       Marland Kitchen lisinopril (PRINIVIL,ZESTRIL) 5 MG tablet Take 5 mg by mouth daily.      . meclizine (ANTIVERT) 50 MG tablet Take 1 tablet (50 mg total) by mouth 3 (three) times daily as needed for dizziness.  30 tablet  0  . prednisoLONE acetate (PRED FORTE) 1 % ophthalmic suspension Place 1 drop into both eyes 4 (four) times daily.      . sitaGLIPtin (JANUVIA) 100 MG tablet Take 100 mg by mouth daily.      . tizanidine (ZANAFLEX) 2 MG capsule Take 1 capsule (2 mg total) by mouth  2 (two) times daily as needed for muscle spasms.  20 capsule  0   No current facility-administered medications on file prior to visit.    Allergies:  Allergies  Allergen Reactions  . Metformin And Related Itching  . Strawberry Itching and Swelling     Review of Systems:  CONSTITUTIONAL: No fevers, chills, night sweats, or weight loss.  +headaches EYES: No visual changes or eye pain ENT: No hearing changes.  No history of nose bleeds.   RESPIRATORY: No cough, wheezing and shortness of breath.   CARDIOVASCULAR: Negative for chest pain, and palpitations.   GI: Negative for abdominal discomfort, blood in stools or black stools.  No recent change in bowel habits.   GU:  No history of incontinence.   MUSCLOSKELETAL: No history of joint pain or swelling.  No myalgias.   SKIN: Negative for lesions, rash, and itching.   ENDOCRINE: Negative for cold or heat intolerance, polydipsia or goiter.   PSYCH:  No depression or anxiety symptoms.   NEURO: As Above.   Vital Signs:  BP 98/70  Pulse 72  Temp(Src) 98.5 F (36.9 C)  Ht 5' 2.6" (1.59 m)  Wt 260 lb 8 oz (118.162 kg)  BMI 46.74 kg/m2  Neurological Exam: MENTAL STATUS including orientation to time, place, person, recent and remote memory, attention span and concentration, language, and fund of knowledge is normal.  Speech is not dysarthric.  CRANIAL NERVES: No visual field defects. Fundoscopic examination does not show any papilledema.  Pupils equal round and reactive to light.  Normal conjugate, extra-ocular eye movements in all directions of gaze.  Subtle left ptosis. Normal facial sensation.  Face is symmetric. Palate elevates symmetrically.  Tongue is midline.  MOTOR:  Motor strength is 5/5 in all extremities.  No atrophy, fasciculations or abnormal movements.  No pronator drift.  Tone is normal.    MSRs:  Reflexes are 2+/4 throughout  SENSORY:  Intact to light touch.  COORDINATION/GAIT:  Normal finger-to- nose-finger and  heel-to-shin.  Intact rapid alternating movements bilaterally.  Gait narrow based and stable.   Data: EMG of the left leg 05/08/2013:  This is a normal electrodiagnostic study of the right lower extremity.   MRI brain 04/24/2013:   1. Partially empty sella configuration, new since 2007. In this clinical setting consider idiopathic intracranial hypertension  (pseudotumor cerebri), although this appearance of the pituitary can be a normal anatomic variant.  2. Otherwise largely stable and normal for age MRI appearance of the brain since 2007.   CSF 05/14/2013:  OP 30   R0 W0  G53  P27   IMPRESSION/PLAN 1.  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 No vision  changes or papilledema.  - CSF labs which are pending include:  CSF IgG, OCB, MBP, ACE 2. Start Diamox 250mg  BID and titrate to 500mg  BID.  Risks and benefits discussed.   3. Strongly encouraged weight loss  4. Recommend formal visual field testing  5. Extensive discussion regarding the pathogenesis, clinical course, and management of disease  6. I do not find any compelling evidence to suggest a neurological basis for her right leg symptoms.  EMG is normal. 7. Return to clinic in 59-months, or sooner as needed   The duration of this appointment visit was 30 minutes of face-to-face time with the patient.  Greater than 50% of this time was spent in counseling, explanation of diagnosis, planning of further management, and coordination of care.   Thank you for allowing me to participate in patient's care.  If I can answer any additional questions, I would be pleased to do so.    Sincerely,    Lanyla Costello K. Posey Pronto, DO

## 2013-05-17 LAB — CSF IGG: IGG CSF: 1.8 mg/dL (ref 0.8–7.7)

## 2013-05-18 LAB — OLIGOCLONAL BANDS, CSF + SERM

## 2013-05-19 NOTE — Telephone Encounter (Signed)
Noted.  Dmarius Reeder K. Eren Puebla, DO   

## 2013-05-23 ENCOUNTER — Encounter: Payer: Self-pay | Admitting: Neurology

## 2013-05-26 ENCOUNTER — Other Ambulatory Visit: Payer: Self-pay | Admitting: Neurology

## 2013-05-26 ENCOUNTER — Telehealth: Payer: Self-pay | Admitting: Neurology

## 2013-05-26 ENCOUNTER — Other Ambulatory Visit: Payer: Self-pay | Admitting: *Deleted

## 2013-05-26 DIAGNOSIS — R209 Unspecified disturbances of skin sensation: Secondary | ICD-10-CM

## 2013-05-26 NOTE — Telephone Encounter (Signed)
She started diamox last week which seemed to have resolved her headaches, but she also reports laying in bed a lot because schools were closed.  On Friday, her headaches returned, she developed numbness/tingling of her feet, and muffled sound from the left ear with ringing. She stopped Diamox 2-days ago, but there has been no change in tingling or ringing of the ears.  I would like to check CMP to look for metabolic derangement and instructed her to stay off Diamox for now.     Taytum Wheller K. Posey Pronto, DO

## 2013-05-26 NOTE — Telephone Encounter (Signed)
Noted.  Courtlyn Aki K. Sana Tessmer, DO   

## 2013-05-26 NOTE — Telephone Encounter (Signed)
CMP ordered.  Pt notified and will have it done this evening.  It is scheduled in [place] because she lives here and works in [place].

## 2013-05-27 ENCOUNTER — Telehealth: Payer: Self-pay | Admitting: Neurology

## 2013-05-27 LAB — COMPLETE METABOLIC PANEL WITH GFR
ALBUMIN: 4.4 g/dL (ref 3.5–5.2)
ALT: 13 U/L (ref 0–35)
AST: 13 U/L (ref 0–37)
Alkaline Phosphatase: 80 U/L (ref 39–117)
BUN: 15 mg/dL (ref 6–23)
CO2: 25 mEq/L (ref 19–32)
Calcium: 9.1 mg/dL (ref 8.4–10.5)
Chloride: 106 mEq/L (ref 96–112)
Creat: 0.95 mg/dL (ref 0.50–1.10)
GFR, EST NON AFRICAN AMERICAN: 76 mL/min
GFR, Est African American: 88 mL/min
Glucose, Bld: 81 mg/dL (ref 70–99)
POTASSIUM: 3.7 meq/L (ref 3.5–5.3)
Sodium: 140 mEq/L (ref 135–145)
Total Bilirubin: 0.2 mg/dL (ref 0.2–1.2)
Total Protein: 6.9 g/dL (ref 6.0–8.3)

## 2013-05-27 NOTE — Telephone Encounter (Signed)
Notified patient that electrolytes are within normal limits.  She went to urgent care last night and was told she has serous otitis media with pressure noted behind the tympanic membrane and has been started on steroids.  There was no signs of infection. I suspect that her dizziness, ringing of the ears, and pressure-sensation is due to this.  I instructed her to restart Diamox 250mg  daily x 3 days, then increase to one tablet twice daily.  She is in agreement of plan.  Donika K. Posey Pronto, DO

## 2013-06-06 ENCOUNTER — Telehealth: Payer: Self-pay | Admitting: Neurology

## 2013-06-06 NOTE — Telephone Encounter (Signed)
Pt w/ facial numbness and severe headache since this AM. Pt wonders if this is side effect from meds. Please call [phone] / Gayleen Orem

## 2013-06-06 NOTE — Telephone Encounter (Signed)
Spoke with pt and informed her that Dr. Posey Pronto is not in the office today.  She said that she would call her PCP.  I will call her back with any further instructions you may have.

## 2013-06-09 ENCOUNTER — Encounter: Payer: Self-pay | Admitting: Neurology

## 2013-06-09 NOTE — Telephone Encounter (Signed)
Pt said that she sent you a message stating that she had already been doing the same adjustment that you suggested.

## 2013-06-09 NOTE — Telephone Encounter (Signed)
MyChart message sent.    Donika K. Posey Pronto, DO

## 2013-06-09 NOTE — Telephone Encounter (Signed)
She reports having numbness over the face on 3/5 which started on the right side, but now involves her entire face.  The has also returned.  Headache is worse with activity and better with rest.  She reports feeling very upset at work which triggered numbness and headaches, so initially attributed it to stress.  She also reports that her left is twitching, but denies any blurry vision or double vision.  She is currently taking diamox 500/250, so have asked her to increase dose to 500/500 x 2 days, then 750/500 x 1 week, then 750mg  BID.  I have also asked her to get eyes checked and informed her that paresthesias can be side effect of medications, so will need to pay attention.  She has meclizine which she can use for dizziness.  She will call with update later this week.  Denise Beckner K. Posey Pronto, DO

## 2013-06-11 LAB — FUNGUS CULTURE W SMEAR: Fungal Smear: NONE SEEN

## 2013-06-13 ENCOUNTER — Telehealth: Payer: Self-pay | Admitting: Neurology

## 2013-06-13 NOTE — Telephone Encounter (Signed)
Please advise 

## 2013-06-13 NOTE — Telephone Encounter (Signed)
Called patient, she increased diamox to 750mg  BID yesterday and does not have significant relief of headache yet.  I will have her set up for large volume tap if pressure is elevated, which she is agreeable.  May titrate diamox further as needed next week.  Will need to follow potassium level.  Denise K. Posey Pronto, DO

## 2013-06-13 NOTE — Telephone Encounter (Signed)
Pt went to the eye dr today and they said that her vision did change a little bit but looked good still having headache  Pt phone number is [phone]

## 2013-06-13 NOTE — Telephone Encounter (Signed)
Spoke with pt and informed her of what we are going to do.  I will call her with appt time and date when complete.

## 2013-06-16 ENCOUNTER — Telehealth: Payer: Self-pay | Admitting: Neurology

## 2013-06-16 NOTE — Telephone Encounter (Signed)
Pt needs spinal tap done later this week please call pt at [phone]

## 2013-06-17 ENCOUNTER — Other Ambulatory Visit: Payer: Self-pay | Admitting: *Deleted

## 2013-06-17 DIAGNOSIS — R51 Headache: Principal | ICD-10-CM

## 2013-06-17 DIAGNOSIS — R519 Headache, unspecified: Secondary | ICD-10-CM

## 2013-06-18 NOTE — Telephone Encounter (Signed)
LM for pt to call me back.  Lumbar puncture is scheduled for Tuesday, March 24.  She needs to arrive at short stay at 8:00 am.  NPO after midnight.

## 2013-06-18 NOTE — Telephone Encounter (Signed)
Pt calling back, has this patients spinal tap been scheduled yet? Please call [phone] / Sherri S.

## 2013-06-18 NOTE — Telephone Encounter (Signed)
Attempted to contact pt again.  LM for her to call me back.

## 2013-06-19 ENCOUNTER — Other Ambulatory Visit: Payer: Self-pay | Admitting: Radiology

## 2013-06-19 NOTE — Telephone Encounter (Signed)
Pt notified that lumbar puncture is scheduled for Tuesday, March 24 at 8:00 am.  Instructed her to go to short stay first.  NPO after midnight.

## 2013-06-19 NOTE — Telephone Encounter (Signed)
LM again for pt to call me back.

## 2013-06-24 ENCOUNTER — Ambulatory Visit (HOSPITAL_COMMUNITY)
Admission: RE | Admit: 2013-06-24 | Discharge: 2013-06-24 | Disposition: A | Discharge status: Home / Self Care | Payer: BC Managed Care – PPO | Source: Ambulatory Visit | Attending: Neurology | Admitting: Neurology

## 2013-06-24 DIAGNOSIS — R51 Headache: Secondary | ICD-10-CM

## 2013-06-24 DIAGNOSIS — R519 Headache, unspecified: Secondary | ICD-10-CM

## 2013-06-24 DIAGNOSIS — G932 Benign intracranial hypertension: Secondary | ICD-10-CM | POA: Insufficient documentation

## 2013-06-24 LAB — CSF CELL COUNT WITH DIFFERENTIAL
Eosinophils, CSF: NONE SEEN % (ref 0–1)
RBC COUNT CSF: 1 /mm3 — AB
Segmented Neutrophils-CSF: NONE SEEN % (ref 0–6)
Tube #: 3
WBC CSF: 0 /mm3 (ref 0–5)

## 2013-06-24 LAB — GRAM STAIN

## 2013-06-24 LAB — GLUCOSE, CSF: GLUCOSE CSF: 48 mg/dL (ref 43–76)

## 2013-06-24 LAB — GLUCOSE, CAPILLARY
GLUCOSE-CAPILLARY: 75 mg/dL (ref 70–99)
Glucose-Capillary: 81 mg/dL (ref 70–99)

## 2013-06-24 LAB — PROTEIN, CSF: Total  Protein, CSF: 34 mg/dL (ref 15–45)

## 2013-06-24 MED ORDER — ACETAMINOPHEN 500 MG PO TABS: 1000.0000 mg | ORAL_TABLET | Freq: Four times a day (QID) | ORAL | Status: DC | PRN

## 2013-06-24 NOTE — Discharge Instructions (Signed)
Lumbar Puncture °A lumbar puncture, or spinal tap, is a procedure in which a small amount of the fluid that surrounds the brain and spinal cord is removed and examined. The fluid is called the cerebrospinal fluid. This procedure may be done to:  °· Help diagnose various problems, such as meningitis, encephalitis, multiple sclerosis, and AIDS.   °· Remove fluid and relieve pressure that occurs with certain types of headaches.   °· Look for bleeding within the brain and spinal cord areas (central nervous system).   °· Place medicine into the spinal fluid.   °LET YOUR HEALTH CARE PROVIDER KNOW ABOUT: °· Any allergies you have. °· All medicines you are taking, including vitamins, herbs, eye drops, creams, and over-the-counter medicines. °· Previous problems you or members of your family have had with the use of anesthetics. °· Any blood disorders you have. °· Previous surgeries you have had. °· Medical conditions you have. °RISKS AND COMPLICATIONS °Generally, this is a safe procedure. However, as with any procedure, complications can occur. Possible complications include:  °· Spinal headache. This is a severe headache that occurs when there is a leak of spinal fluid. A spinal headache causes discomfort but is not dangerous. If it persists, another procedure may be done to treat the headache. °· Bleeding. This most often occurs in people with bleeding disorders. These are disorders in which the blood does not clot normally.   °· Infection at the insertion site that can spread to the bone or spinal fluid.  °· Formation of a spinal cord tumor (rare). °· Brain herniation or movement of the brain into the spinal cord (rare). °· Inability to move (extremely rare). °BEFORE THE PROCEDURE °· You may have blood tests done. These tests can help tell how well your kidneys and liver are working. They can also show how well your blood clots.   °· If you take blood thinners (anticoagulant medicine), ask your health care provider if  and when you should stop taking them.   °· Your health care provider may order a CT scan of your brain. °· Make arrangements for someone to drive you home after the procedure.     °PROCEDURE °· You will be positioned so that the spaces between the bones of the spine (vertebrae) are as wide as possible. This will make it easier to pass the needle into the spinal canal.  °· Depending on your age and size, you may lie on your side, curled up with your knees under your chin. Or, you may sit with your head resting on a pillow that is placed at waist level. °· The skin covering the lower back (or lumbar region) will be cleaned.   °· The skin may be numbed with medicine. °· You may be given pain medicine or a medicine to help you relax (sedative).   °· A small needle will be inserted in the skin until it enters the space that contains the spinal fluid. The needle will not enter the spinal cord.   °· The spinal fluid will be collected into tubes.   °· The needle will be withdrawn, and a bandage will be placed on the site.   °AFTER THE PROCEDURE °· You will remain lying down for 1 hour or for as long as your health care provider suggests.   °· The spinal fluid will be sent to a laboratory to be examined. The results of the examination may be available before you go home. °· A test, called a culture, may be taken of the spinal fluid if your health care provider thinks you have an infection. If cultures   were taken for exam, the results will usually be available in a couple of days.  Document Released: 03/17/2000 Document Revised: 01/08/2013 Document Reviewed: 11/25/2012 [employer] Patient Information 2014 [employer].  

## 2013-06-25 ENCOUNTER — Encounter: Payer: Self-pay | Admitting: Neurology

## 2013-06-25 ENCOUNTER — Other Ambulatory Visit: Payer: Self-pay | Admitting: *Deleted

## 2013-06-25 DIAGNOSIS — G932 Benign intracranial hypertension: Secondary | ICD-10-CM

## 2013-06-25 MED ORDER — ACETAZOLAMIDE 250 MG PO TABS: ORAL_TABLET | ORAL | Status: DC

## 2013-06-27 ENCOUNTER — Other Ambulatory Visit: Payer: Self-pay | Admitting: Neurology

## 2013-06-27 ENCOUNTER — Encounter: Payer: Self-pay | Admitting: Neurology

## 2013-06-27 ENCOUNTER — Telehealth: Payer: Self-pay | Admitting: Neurology

## 2013-06-27 LAB — CSF CULTURE

## 2013-06-27 LAB — CSF CULTURE W GRAM STAIN
Culture: NO GROWTH
Gram Stain: NONE SEEN

## 2013-06-27 LAB — ANGIOTENSIN CONVERTING ENZYME, CSF: Angio Convert Enzyme: 4 U/L (ref <=15)

## 2013-06-27 MED ORDER — FUROSEMIDE 20 MG PO TABS: 10.0000 mg | ORAL_TABLET | Freq: Every day | ORAL | Status: DC

## 2013-06-27 NOTE — Telephone Encounter (Signed)
Called patient to discuss her headache, but there was no answer.  I have asked her to return my call so we can decide next step.  Rob Mciver K. Posey Pronto, DO

## 2013-06-27 NOTE — Telephone Encounter (Signed)
Denise Cook from [facility] called back and requested for me to send a referral over.  Referral faxed and they will contact pt.

## 2013-06-27 NOTE — Telephone Encounter (Signed)
Spoke with pt and informed her that I called to schedule her appt but had to leave message for the coordinator to call me back.

## 2013-06-27 NOTE — Telephone Encounter (Signed)
Patient returned my call stating that after second LP headache improved to 3-4/10, but this morning headaches is back to 8/10.  She reports having shining light this morning in her eyes, but denies any blurry vision or visual field deficits.  She is already taking diamox 2g/d.  I will add lasix 10mg  and asked her to take orange juice and banana with this as both medications will lower her potassium levels.  She is already to scheduled to have BMP checked.  Given that her headaches are difficult to control despite optimizing medications, I will place a referral to neurosurgery for shunt evaluation.  She is in agreement of plan.  Denise Pelley K. Posey Pronto, DO

## 2013-06-30 ENCOUNTER — Encounter: Payer: Self-pay | Admitting: Neurology

## 2013-06-30 NOTE — Telephone Encounter (Signed)
Called patient and answered her questions.  She she complaining of moderate numbness/tingling which is most likely due to diamox.  I do not think I can increase further due to side effects.  Awaiting neurosurgery appointment for shunt evaluation.  Donika K. Posey Pronto, DO

## 2013-07-01 ENCOUNTER — Telehealth: Payer: Self-pay | Admitting: *Deleted

## 2013-07-01 ENCOUNTER — Encounter: Payer: Self-pay | Admitting: Neurology

## 2013-07-01 NOTE — Telephone Encounter (Signed)
Spoke with pt and informed her that someone from neurosurgery called me today about her referral.  Faxed another referral since they could not find the first one.  They should be contacting pt soon.

## 2013-07-01 NOTE — Telephone Encounter (Signed)
Denise Cook and she requested for me to send another form.

## 2013-07-01 NOTE — Telephone Encounter (Signed)
Message copied by Chester Holstein on Tue Jul 01, 2013  1:56 PM ------      Message from: Blenda Peals      Created: Tue Jul 01, 2013  1:05 PM      Regarding: Vania Rea from [facility]      Contact: [phone]       Roderick Pee from [facility] called regarding the referral for Pt Rogene Houston. She states that she does not see the referral that was sent.            Please call her back at [phone] ------

## 2013-07-03 ENCOUNTER — Emergency Department (HOSPITAL_COMMUNITY)
Admission: EM | Admit: 2013-07-03 | Discharge: 2013-07-03 | Disposition: A | Discharge status: Home / Self Care | Payer: BC Managed Care – PPO | Source: Home / Self Care

## 2013-07-03 ENCOUNTER — Encounter (HOSPITAL_COMMUNITY): Payer: Self-pay | Admitting: Emergency Medicine

## 2013-07-03 ENCOUNTER — Emergency Department (INDEPENDENT_AMBULATORY_CARE_PROVIDER_SITE_OTHER): Payer: BC Managed Care – PPO

## 2013-07-03 ENCOUNTER — Telehealth: Payer: Self-pay | Admitting: Neurology

## 2013-07-03 DIAGNOSIS — J329 Chronic sinusitis, unspecified: Secondary | ICD-10-CM

## 2013-07-03 DIAGNOSIS — R0602 Shortness of breath: Secondary | ICD-10-CM

## 2013-07-03 DIAGNOSIS — R509 Fever, unspecified: Secondary | ICD-10-CM

## 2013-07-03 LAB — POCT I-STAT, CHEM 8
BUN: 11 mg/dL (ref 6–23)
CALCIUM ION: 1.22 mmol/L (ref 1.12–1.23)
Chloride: 110 mEq/L (ref 96–112)
Creatinine, Ser: 1.3 mg/dL — ABNORMAL HIGH (ref 0.50–1.10)
Glucose, Bld: 110 mg/dL — ABNORMAL HIGH (ref 70–99)
HCT: 38 % (ref 36.0–46.0)
Hemoglobin: 12.9 g/dL (ref 12.0–15.0)
Potassium: 3.6 mEq/L — ABNORMAL LOW (ref 3.7–5.3)
Sodium: 143 mEq/L (ref 137–147)
TCO2: 19 mmol/L (ref 0–100)

## 2013-07-03 MED ORDER — IBUPROFEN 800 MG PO TABS
ORAL_TABLET | ORAL | Status: AC
  Filled 2013-07-03: qty 1

## 2013-07-03 MED ORDER — IPRATROPIUM-ALBUTEROL 0.5-2.5 (3) MG/3ML IN SOLN
3.0000 mL | Freq: Once | RESPIRATORY_TRACT | Status: AC
  Administered 2013-07-03: 3 mL via RESPIRATORY_TRACT

## 2013-07-03 MED ORDER — IBUPROFEN 800 MG PO TABS
800.0000 mg | ORAL_TABLET | Freq: Once | ORAL | Status: AC
  Administered 2013-07-03: 800 mg via ORAL

## 2013-07-03 MED ORDER — PREDNISONE 10 MG PO TABS: ORAL_TABLET | ORAL | Status: DC

## 2013-07-03 MED ORDER — IPRATROPIUM-ALBUTEROL 0.5-2.5 (3) MG/3ML IN SOLN
RESPIRATORY_TRACT | Status: AC
  Filled 2013-07-03: qty 3

## 2013-07-03 MED ORDER — AZITHROMYCIN 250 MG PO TABS: ORAL_TABLET | ORAL | Status: DC

## 2013-07-03 MED ORDER — POTASSIUM CHLORIDE ER 10 MEQ PO TBCR: 10.0000 meq | EXTENDED_RELEASE_TABLET | Freq: Two times a day (BID) | ORAL | Status: DC

## 2013-07-03 NOTE — ED Notes (Signed)
Patient transported to X-ray before breathing tx. Started.

## 2013-07-03 NOTE — Telephone Encounter (Signed)
Called pt back and she was requesting for her notes to be sent to [facility].  Informed her that notes have already been sent.

## 2013-07-03 NOTE — Discharge Instructions (Signed)
Hypokalemia Hypokalemia means that the amount of potassium in the blood is lower than normal.Potassium is a chemical, called an electrolyte, that helps regulate the amount of fluid in the body. It also stimulates muscle contraction and helps nerves function properly.Most of the body's potassium is inside of cells, and only a very small amount is in the blood. Because the amount in the blood is so small, minor changes can be life-threatening. CAUSES  Antibiotics.  Diarrhea or vomiting.  Using laxatives too much, which can cause diarrhea.  Chronic kidney disease.  Water pills (diuretics).  Eating disorders (bulimia).  Low magnesium level.  Sweating a lot. SIGNS AND SYMPTOMS  Weakness.  Constipation.  Fatigue.  Muscle cramps.  Mental confusion.  Skipped heartbeats or irregular heartbeat (palpitations).  Tingling or numbness. DIAGNOSIS  Your health care provider can diagnose hypokalemia with blood tests. In addition to checking your potassium level, your health care provider may also check other lab tests. TREATMENT Hypokalemia can be treated with potassium supplements taken by mouth or adjustments in your current medicines. If your potassium level is very low, you may need to get potassium through a vein (IV) and be monitored in the hospital. A diet high in potassium is also helpful. Foods high in potassium are:  Nuts, such as peanuts and pistachios.  Seeds, such as sunflower seeds and pumpkin seeds.  Peas, lentils, and lima beans.  Whole grain and bran cereals and breads.  Fresh fruit and vegetables, such as apricots, avocado, bananas, cantaloupe, kiwi, oranges, tomatoes, asparagus, and potatoes.  Orange and tomato juices.  Red meats.  Fruit yogurt. HOME CARE INSTRUCTIONS  Take all medicines as prescribed by your health care provider.  Maintain a healthy diet by including nutritious food, such as fruits, vegetables, nuts, whole grains, and lean meats.  If  you are taking a laxative, be sure to follow the directions on the label. SEEK MEDICAL CARE IF:  Your weakness gets worse.  You feel your heart pounding or racing.  You are vomiting or having diarrhea.  You are diabetic and having trouble keeping your blood glucose in the normal range. SEEK IMMEDIATE MEDICAL CARE IF:  You have chest pain, shortness of breath, or dizziness.  You are vomiting or having diarrhea for more than 2 days.  You faint. MAKE SURE YOU:   Understand these instructions.  Will watch your condition.  Will get help right away if you are not doing well or get worse. Document Released: 03/20/2005 Document Revised: 01/08/2013 Document Reviewed: 09/20/2012 [facility] Patient Information 2014 [place]. Shortness of Breath Shortness of breath means you have trouble breathing. Shortness of breath needs medical care right away. HOME CARE   Do not smoke.  Avoid being around chemicals or things (paint fumes, dust) that may bother your breathing.  Rest as needed. Slowly begin your normal activities.  Only take medicines as told by your doctor.  Keep all doctor visits as told. GET HELP RIGHT AWAY IF:   Your shortness of breath gets worse.  You feel lightheaded, pass out (faint), or have a cough that is not helped by medicine.  You cough up blood.  You have pain with breathing.  You have pain in your chest, arms, shoulders, or belly (abdomen).  You have a fever.  You cannot walk up stairs or exercise the way you normally do.  You do not get better in the time expected.  You have a hard time doing normal activities even with rest.  You have problems  with your medicines.  You have any new symptoms. MAKE SURE YOU:  Understand these instructions.  Will watch your condition.  Will get help right away if you are not doing well or get worse. Document Released: 09/06/2007 Document Revised: 09/19/2011 Document Reviewed: 06/05/2011 [facility]  Patient Information 2014 [place], [place]. Pneumonia, Adult Pneumonia is an infection of the lungs. It may be caused by a germ (virus or bacteria). Some types of pneumonia can spread easily from person to person. This can happen when you cough or sneeze. HOME CARE  Only take medicine as told by your doctor.  Take your medicine (antibiotics) as told. Finish it even if you start to feel better.  Do not smoke.  You may use a vaporizer or humidifier in your room. This can help loosen thick spit (mucus).  Sleep so you are almost sitting up (semi-upright). This helps reduce coughing.  Rest. A shot (vaccine) can help prevent pneumonia. Shots are often advised for:  People over 34 years old.  Patients on chemotherapy.  People with long-term (chronic) lung problems.  People with immune system problems. GET HELP RIGHT AWAY IF:   You are getting worse.  You cannot control your cough, and you are losing sleep.  You cough up blood.  Your pain gets worse, even with medicine.  You have a fever.  Any of your problems are getting worse, not better.  You have shortness of breath or chest pain. MAKE SURE YOU:   Understand these instructions.  Will watch your condition.  Will get help right away if you are not doing well or get worse. Document Released: 09/06/2007 Document Revised: 06/12/2011 Document Reviewed: 06/10/2010 [facility] Patient Information 2014 [place].

## 2013-07-03 NOTE — ED Provider Notes (Signed)
Medical screening examination/treatment/procedure(s) were performed by a resident physician or non-physician practitioner and as the supervising physician I was immediately available for consultation/collaboration.  Jatavious Peppard, MD    Michaeal Davis S Davidlee Jeanbaptiste, MD 07/03/13 2019 

## 2013-07-03 NOTE — ED Provider Notes (Signed)
CSN: [id number]     Arrival date & time 07/03/13  1811 History   None    No chief complaint on file.  (Consider location/radiation/quality/duration/timing/severity/associated sxs/prior Treatment) HPI Comments: 38 yo female presents with fever of 103 down to 101 with Tylenol 4 hours ago. She notes burning in upper chest and feels it is hard to breath. She notes increased cough production and nasal production of yellow color. She has had 3 inhaler treatments today without relief. She notes her hands are starting to tingle and feel mildly numb. She is concerned with high dose of medicine started for increased cerebral fluid and knows low potassium can be SE. She has appointment scheduled with Neuro soon for shunt placement.   Patient is a 38 y.o. female presenting with shortness of breath.  Shortness of Breath Associated symptoms: cough and fever     Past Medical History  Diagnosis Date  . Diabetes mellitus   . Asthma   . Sarcoidosis 2012  . Obesity   . Pseudotumor cerebri    Past Surgical History  Procedure Laterality Date  . Abdominal hysterectomy    . Cholecystectomy    . Cardiac surgery    . Left eye surgery     Family History  Problem Relation Age of Onset  . Adopted: Yes  . Healthy Daughter   . Cancer Maternal Grandmother   . Alcoholism Mother   . Diabetes Paternal Grandmother    History  Substance Use Topics  . Smoking status: Never Smoker   . Smokeless tobacco: Never Used  . Alcohol Use: No   OB History   Grav Para Term Preterm Abortions TAB SAB Ect Mult Living                 Review of Systems  Constitutional: Positive for fever.  HENT: Positive for congestion, postnasal drip and sinus pressure.   Respiratory: Positive for cough and shortness of breath.   Neurological: Positive for numbness.  Psychiatric/Behavioral: The patient is nervous/anxious.   All other systems reviewed and are negative.    Allergies  Metformin and related and Strawberry  Home  Medications   Current Outpatient Rx  Name  Route  Sig  Dispense  Refill  . acetaminophen (TYLENOL) 500 MG tablet   Oral   Take 1,000 mg by mouth every 6 (six) hours as needed for pain.         Marland Kitchen acetaZOLAMIDE (DIAMOX) 250 MG tablet      Take 2 tablets by mouth twice daily.  (total of 1000 mg)   120 tablet   3   . albuterol-ipratropium (COMBIVENT) 18-103 MCG/ACT inhaler   Inhalation   Inhale 2 puffs into the lungs every 4 (four) hours as needed. For shortness of breath and wheezing          . azaTHIOprine (IMURAN) 50 MG tablet   Oral   Take 200 mg by mouth at bedtime.          . Cyanocobalamin (B-12) 1000 MCG CAPS   Oral   Take 1,000 mcg by mouth daily.          . diclofenac (VOLTAREN) 75 MG EC tablet   Oral   Take 75 mg by mouth 2 (two) times daily.         . dorzolamide-timolol (COSOPT) 22.3-6.8 MG/ML ophthalmic solution   Both Eyes   Place 1 drop into both eyes 2 (two) times daily.          . furosemide (  LASIX) 20 MG tablet   Oral   Take 0.5 tablets (10 mg total) by mouth daily.   30 tablet   3   . ketorolac (ACULAR) 0.4 % SOLN   Both Eyes   Place 1 drop into both eyes 4 (four) times daily.          . Liraglutide (VICTOZA) 18 MG/3ML SOPN   Subcutaneous   Inject 1.2 mLs into the skin daily.          Marland Kitchen lisinopril (PRINIVIL,ZESTRIL) 5 MG tablet   Oral   Take 5 mg by mouth daily.         . meclizine (ANTIVERT) 50 MG tablet   Oral   Take 1 tablet (50 mg total) by mouth 3 (three) times daily as needed for dizziness.   30 tablet   0   . prednisoLONE acetate (PRED FORTE) 1 % ophthalmic suspension   Both Eyes   Place 1 drop into both eyes 4 (four) times daily.         . sitaGLIPtin (JANUVIA) 100 MG tablet   Oral   Take 100 mg by mouth daily.         . tizanidine (ZANAFLEX) 2 MG capsule   Oral   Take 1 capsule (2 mg total) by mouth 2 (two) times daily as needed for muscle spasms.   20 capsule   0    There were no vitals taken for  this visit. Physical Exam  Nursing note and vitals reviewed. Constitutional: She is oriented to person, place, and time. She appears well-developed and well-nourished.  HENT:  Head: Normocephalic and atraumatic.  Right Ear: External ear normal.  Left Ear: External ear normal.  Nose: Nose normal.  Mouth/Throat: Oropharynx is clear and moist. No oropharyngeal exudate.  Yellow TMs bilateral, Frontal tenderness, Maxillary tenderness   Eyes: Conjunctivae and EOM are normal.  Neck: Normal range of motion.  Cardiovascular: Normal rate, regular rhythm, normal heart sounds and intact distal pulses.   Pulmonary/Chest: Effort normal.  Congested Breath sounds, clears some with cough   Musculoskeletal: Normal range of motion.  Lymphadenopathy:    She has no cervical adenopathy.  Neurological: She is alert and oriented to person, place, and time.  Skin: Skin is warm and dry.  Psychiatric: She has a normal mood and affect. Judgment normal.    ED Course  Procedures (including critical care time) Labs Review Labs Reviewed - No data to display Imaging Review No results found.   MDM  1. SOB probably pneumonia/ sinusitis/ fever-Ibuprofen 800 mg/ Duoneb in office, Continue Inhaler AD at home. Zpak/ Pred AD. ER if symptoms increase. Patient notes Chest burning has improved with Duoneb and she is feeling better and SOB/ numbness/ tingle has improved with discussion about anxiety with upcoming shunt surgery and fever.   2. Start  Hypokalemia- Add potassium 10 meq 1 BID f/u Monday PCP for recheck.    Ardis Hughs, PA-C 07/03/13 1928

## 2013-07-03 NOTE — Telephone Encounter (Signed)
Pt needs to talk to someone about her appt on Monday please call [phone]

## 2013-07-03 NOTE — ED Notes (Signed)
C/o SOB onset today.  Fever and body aches onset today. Has been coughing when she gets out of breath.  Using inhaler x 3 without relief.  LD tylenol @ 1500 for temp of 101.5.

## 2013-07-07 ENCOUNTER — Telehealth: Payer: Self-pay | Admitting: *Deleted

## 2013-07-07 NOTE — Telephone Encounter (Signed)
Pt called nurse on call this past weekend d/t cough, congestion and sore throat.  Called this morning to check on her per Dr. Posey Pronto.  No answer.  Left message for her to call me back.

## 2013-07-18 ENCOUNTER — Encounter: Payer: Self-pay | Admitting: Neurology

## 2013-07-18 ENCOUNTER — Ambulatory Visit (INDEPENDENT_AMBULATORY_CARE_PROVIDER_SITE_OTHER): Payer: BC Managed Care – PPO | Admitting: Neurology

## 2013-07-18 VITALS — BP 110/64 | HR 72 | Resp 20 | Ht 61.5 in | Wt 256.0 lb

## 2013-07-18 DIAGNOSIS — Z713 Dietary counseling and surveillance: Secondary | ICD-10-CM

## 2013-07-18 DIAGNOSIS — G932 Benign intracranial hypertension: Secondary | ICD-10-CM

## 2013-07-18 NOTE — Patient Instructions (Signed)
1.  Continue Diamox 1000mg  twice daily 2.  Continue lasix 20mg  daily 3.  Strongly encouraged weight loss 4.  Send me a weekly update of weight loss on MyChart 5.  Return to clinic in 86-month

## 2013-07-18 NOTE — Progress Notes (Signed)
Follow-up Visit   Date: 07/18/2013    Denise Cook MRN: [id number] DOB: Feb 19, 1976   Interim History: Denise Cook is a 38 y.o. 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  The patient was accompanied to the clinic by self.  She was last seen in the clinic on 05/21/2013.  History of present illness: In 2013, she developed right leg pain after falling down her steps with associated swelling of the lower leg, but sparing the foot. Pain is described as achy and involves only the anterior aspect of the leg along the bone, about 2 inches about the ankle and 4inches below the knee. It is worse with activity, prolonged standing or walking, and tender to touch. She denies any numbness, tingling, or weakness. She has tried a TENS unit without benefit. Nothing seems to alleviate her pain. Previous work-up has included orthopeadic evaluation who performed MRI of the leg without contrast and showed mild subcutaneous edema. She was managed as possible tibial stress syndrome with therapy, applying ice/heat, elevating legs, and stockings without improvement.   She also complains of daily headache for the past two months. In November, she was interviewing for a new job and reports being very stressed. She developed a headache at the base of her head, described as throbbing ache which has not improved since onset. She has nausea, photophobia, and phonophobia. Headache is worse with increased stress and it is improved with rest and relaxation. Denies morning headaches or of symptoms with changes in position. She has tried toradol injection, tramadol, hydrocodone, and exedrin migraine without any relief.   Of note, the dose of  imuran was increased to 200mg .   - Follow-up 05/16/2013:  She had a GON block on 1/19 for headaches without significant benefits.  Subsequently MRI of the brain was done which was concerning for pseudotumor cerebri which was confirmed by LP which showed an OP of 67mmHg.  She did not have any change in headaches following LP.  EMG of the right leg was normal.  She saw her ophthalmologist (Dr. Nehemiah Massed) who did not find any evidence of increased IOP.    - Follow-up 07/18/2013:  She had worsening headaches, despite increasing diamox so underwent second LP which showed OP 37.  Headaches resolved after LP, but quickly returned with a vengeance within 2-days.  Headaches were even worse than before, so diamox was increased to 1000 BID.  She continued to headaches so lasix was added and was referred to neurosurgery who did not recommend shunt due to no papilledema and vision symptoms.  About 10-days ago (soon after started lasix), headaches slowly improved to know 4/10 most days, when severe 10/10 (occuring 3-times per week).  Her potassium was checked and found to be low 2.9 and she has subsequently been started on potassium 58mEq twice daily.   Medications:  Current Outpatient Prescriptions on File Prior to Visit  Medication Sig Dispense Refill  . acetaZOLAMIDE (DIAMOX) 250 MG tablet Take 2 tablets by mouth twice daily.  (total of 1000 mg)  120 tablet  3  . albuterol-ipratropium (COMBIVENT) 18-103 MCG/ACT inhaler Inhale 2 puffs into the lungs every 4 (four) hours as needed. For shortness of breath and wheezing       . Cyanocobalamin (B-12) 1000 MCG CAPS  Take 1,000 mcg by mouth daily.       . dorzolamide-timolol (COSOPT) 22.3-6.8 MG/ML ophthalmic solution Place 1 drop into both eyes 2 (two) times daily.       . furosemide (LASIX) 20 MG tablet Take 0.5 tablets (10 mg total) by mouth daily.  30 tablet  3  . ketorolac (ACULAR) 0.4 % SOLN Place 1 drop into both eyes 4 (four) times daily.       . Liraglutide  (VICTOZA) 18 MG/3ML SOPN Inject 1.2 mLs into the skin daily.       Marland Kitchen lisinopril (PRINIVIL,ZESTRIL) 5 MG tablet Take 5 mg by mouth daily.      . meclizine (ANTIVERT) 50 MG tablet Take 1 tablet (50 mg total) by mouth 3 (three) times daily as needed for dizziness.  30 tablet  0  . potassium chloride (K-DUR) 10 MEQ tablet Take 1 tablet (10 mEq total) by mouth 2 (two) times daily.  60 tablet  0  . prednisoLONE acetate (PRED FORTE) 1 % ophthalmic suspension Place 1 drop into both eyes 4 (four) times daily.      . sitaGLIPtin (JANUVIA) 100 MG tablet Take 100 mg by mouth daily.      . tizanidine (ZANAFLEX) 2 MG capsule Take 1 capsule (2 mg total) by mouth 2 (two) times daily as needed for muscle spasms.  20 capsule  0  . azaTHIOprine (IMURAN) 50 MG tablet Take 200 mg by mouth at bedtime.       . diclofenac (VOLTAREN) 75 MG EC tablet Take 75 mg by mouth 2 (two) times daily.       No current facility-administered medications on file prior to visit.    Allergies:  Allergies  Allergen Reactions  . Metformin And Related Itching  . Strawberry Itching and Swelling     Review of Systems:  CONSTITUTIONAL: No fevers, chills, night sweats, or weight loss.  +headaches EYES: No visual changes or eye pain ENT: No hearing changes.  No history of nose bleeds.   RESPIRATORY: No cough, wheezing and shortness of breath.   CARDIOVASCULAR: Negative for chest pain, and palpitations.   GI: Negative for abdominal discomfort, blood in stools or black stools.  No recent change in bowel habits.   GU:  No history of incontinence.   MUSCLOSKELETAL: No history of joint pain or swelling.  No myalgias.   SKIN: Negative for lesions, rash, and itching.   ENDOCRINE: Negative for cold or heat intolerance, polydipsia or goiter.   PSYCH:  No depression or anxiety symptoms.   NEURO: As Above.   Vital Signs:  BP 110/64  Pulse 72  Resp 20  Ht 5' 1.5" (1.562 m)  Wt 256 lb (116.121 kg)  BMI 47.59 kg/m2  Neurological  Exam: MENTAL STATUS including orientation to time, place, person, recent and remote memory, attention span and concentration, language, and fund of knowledge is normal.  Speech is not dysarthric.  CRANIAL NERVES: No visual field defects. Fundoscopic examination does not show any papilledema, disc margins.  Pupils equal round and reactive to light.  Normal conjugate, extra-ocular eye movements in all directions of gaze. Normal facial sensation.  Face is symmetric. Palate elevates symmetrically.  Tongue is midline.  MOTOR:  Motor strength is 5/5 in all extremities.   MSRs:  Reflexes are 2+/4 throughout  SENSORY:  Intact to light touch.  COORDINATION/GAIT:  Gait narrow based and stable.   Data: EMG of the left leg 05/08/2013:  This is a normal electrodiagnostic study of  the right lower extremity.   MRI brain 04/24/2013:   1. Partially empty sella configuration, new since 2007. In this clinical setting consider idiopathic intracranial hypertension  (pseudotumor cerebri), although this appearance of the pituitary can be a normal anatomic variant.  2. Otherwise largely stable and normal for age MRI appearance of the brain since 2007.   CSF 05/14/2013:  OP 30   R0 W0  G53  P27, ACE neg CSF 06/24/2013:  OP 37  R1  W0  G48  P34, ACE neg  Labs 07/08/2013:  Na 140, K 3.4, Chl 107, CO2 25, Cr 1.09, BUN 17, AST 39, ALT 43, HbA1c 5.7   IMPRESSION/PLAN 1.  Pseudotumor cerebri, diagnosed 05/2013 (OP 30cm)  - Transient worsening of symptoms in late march so underwent 2nd large volume tap (OP 37)   - Evaluated by neurosurgery who does not recommend VP shunt due to no visual symptoms  - Medications have been titrated and she is clinically doing fairly stable with headache intensity of 4/10 2. Continue Diamox 1000mg  BID.  She is having mild paresthesias, but otherwise tolerating dose 3. Continue Lasix 20mg  daily.  Appreciate Dr. Alyson Ingles starting potassium 67mEq BID and checking potassium 4. I spent most of the  office visit discussing the importance of weight loss, as patients with pseudotumor cerebri can reduce the amount of medications needed.  She seems to be getting many of her calories from regular sodas and I strongly urged her to switch to diet beverages or water. Discussed lifestyle changes, diet changes, calorie counting, and exercises strategies at length.   5.  Return to clinic in 24-months, or sooner as needed   The duration of this appointment visit was 40 minutes of face-to-face time with the patient.  Greater than 50% of this time was spent in counseling, explanation of diagnosis, planning of further management, and coordination of care.   Thank you for allowing me to participate in patient's care.  If I can answer any additional questions, I would be pleased to do so.    Sincerely,    Hector Taft K. Posey Pronto, DO

## 2013-07-21 MED ORDER — ACETAZOLAMIDE 250 MG PO TABS: ORAL_TABLET | ORAL | Status: DC

## 2013-07-21 NOTE — Progress Notes (Signed)
Note faxed to [phone].

## 2013-07-21 NOTE — Addendum Note (Signed)
Addended by: Alda Berthold on: 07/21/2013 11:01 AM   Modules accepted: Orders

## 2013-07-22 LAB — FUNGUS CULTURE W SMEAR: FUNGAL SMEAR: NONE SEEN

## 2013-07-23 ENCOUNTER — Encounter: Payer: Self-pay | Admitting: *Deleted

## 2013-07-23 NOTE — Progress Notes (Unsigned)
Patient called stating that she has increased her exercise and today her headache is at a 10.  I instructed her to go to Urgent Care or the ER since Dr. Posey Pronto is out of the office and only one doctor is left in the office today.  Patient agreed.

## 2013-07-28 ENCOUNTER — Encounter: Payer: Self-pay | Admitting: Neurology

## 2013-07-28 MED ORDER — ZOLMITRIPTAN 5 MG PO TBDP: 5.0000 mg | ORAL_TABLET | ORAL | Status: DC | PRN

## 2013-07-28 MED ORDER — SUMATRIPTAN SUCCINATE 50 MG PO TABS: ORAL_TABLET | ORAL | Status: DC

## 2013-07-28 NOTE — Telephone Encounter (Signed)
Return call to patient. She reports having severe right sided unilateral headache associated with nausea, vomiting, photophobia, and phonophobia. She has a history of migraines, but none recently. I explained that it is difficult to determine whether this is worsening pseudotumor cerebri however signs and symptoms are most consistent with a migraine so I will offer her a trial of triptans.  Prescription for Zomig soluble tablet was sent, however due to financial reasons I also given her a prescription of Imitrex. She may take whichever is more affordable.  Setsuko Robins K. Posey Pronto, DO

## 2013-07-29 ENCOUNTER — Encounter: Payer: Self-pay | Admitting: Neurology

## 2013-07-29 NOTE — Telephone Encounter (Signed)
Let's arrange for large volume tap.  Same orders as the last time - CSF cell count and diff, protein, glucose, ACE, cultures.  In comments, please write - "high volume tap for pseudotumor cerebri".  She is requesting this Friday, if possible.  Eleana Tocco K. Posey Pronto, DO

## 2013-07-30 ENCOUNTER — Other Ambulatory Visit: Payer: Self-pay | Admitting: *Deleted

## 2013-07-30 ENCOUNTER — Encounter: Payer: Self-pay | Admitting: Neurology

## 2013-07-30 ENCOUNTER — Encounter: Payer: Self-pay | Admitting: *Deleted

## 2013-07-30 DIAGNOSIS — G932 Benign intracranial hypertension: Secondary | ICD-10-CM

## 2013-07-30 NOTE — Progress Notes (Signed)
I could not get LP for Friday because they require a 3 day notice.  I did get her scheduled for Monday, May 4.  Patient notified and instructed to arrive at 8:30 am for 10:00 am appointment.

## 2013-08-04 ENCOUNTER — Ambulatory Visit (HOSPITAL_COMMUNITY): Payer: BC Managed Care – PPO

## 2013-08-05 ENCOUNTER — Other Ambulatory Visit: Payer: Self-pay | Admitting: Radiology

## 2013-08-07 ENCOUNTER — Ambulatory Visit (HOSPITAL_COMMUNITY)
Admission: RE | Admit: 2013-08-07 | Discharge: 2013-08-07 | Disposition: A | Discharge status: Home / Self Care | Payer: BC Managed Care – PPO | Source: Ambulatory Visit | Attending: Neurology | Admitting: Neurology

## 2013-08-07 DIAGNOSIS — G932 Benign intracranial hypertension: Secondary | ICD-10-CM

## 2013-08-07 LAB — GLUCOSE, CSF: Glucose, CSF: 62 mg/dL (ref 43–76)

## 2013-08-07 LAB — CSF CELL COUNT WITH DIFFERENTIAL
Eosinophils, CSF: NONE SEEN % (ref 0–1)
RBC Count, CSF: 9 /mm3 — ABNORMAL HIGH
Segmented Neutrophils-CSF: NONE SEEN % (ref 0–6)
TUBE #: 3
WBC, CSF: 0 /mm3 (ref 0–5)

## 2013-08-07 LAB — GRAM STAIN

## 2013-08-07 LAB — PROTEIN, CSF: Total  Protein, CSF: 32 mg/dL (ref 15–45)

## 2013-08-07 MED ORDER — ACETAMINOPHEN 325 MG PO TABS: 650.0000 mg | ORAL_TABLET | ORAL | Status: DC | PRN

## 2013-08-07 NOTE — Discharge Instructions (Signed)
Lumbar Puncture °A lumbar puncture, or spinal tap, is a procedure in which a small amount of the fluid that surrounds the brain and spinal cord is removed and examined. The fluid is called the cerebrospinal fluid. This procedure may be done to:  °· Help diagnose various problems, such as meningitis, encephalitis, multiple sclerosis, and AIDS.   °· Remove fluid and relieve pressure that occurs with certain types of headaches.   °· Look for bleeding within the brain and spinal cord areas (central nervous system).   °· Place medicine into the spinal fluid.   °LET YOUR HEALTH CARE PROVIDER KNOW ABOUT: °· Any allergies you have. °· All medicines you are taking, including vitamins, herbs, eye drops, creams, and over-the-counter medicines. °· Previous problems you or members of your family have had with the use of anesthetics. °· Any blood disorders you have. °· Previous surgeries you have had. °· Medical conditions you have. °RISKS AND COMPLICATIONS °Generally, this is a safe procedure. However, as with any procedure, complications can occur. Possible complications include:  °· Spinal headache. This is a severe headache that occurs when there is a leak of spinal fluid. A spinal headache causes discomfort but is not dangerous. If it persists, another procedure may be done to treat the headache. °· Bleeding. This most often occurs in people with bleeding disorders. These are disorders in which the blood does not clot normally.   °· Infection at the insertion site that can spread to the bone or spinal fluid.  °· Formation of a spinal cord tumor (rare). °· Brain herniation or movement of the brain into the spinal cord (rare). °· Inability to move (extremely rare). °BEFORE THE PROCEDURE °· You may have blood tests done. These tests can help tell how well your kidneys and liver are working. They can also show how well your blood clots.   °· If you take blood thinners (anticoagulant medicine), ask your health care provider if  and when you should stop taking them.   °· Your health care provider may order a CT scan of your brain. °· Make arrangements for someone to drive you home after the procedure.     °PROCEDURE °· You will be positioned so that the spaces between the bones of the spine (vertebrae) are as wide as possible. This will make it easier to pass the needle into the spinal canal.  °· Depending on your age and size, you may lie on your side, curled up with your knees under your chin. Or, you may sit with your head resting on a pillow that is placed at waist level. °· The skin covering the lower back (or lumbar region) will be cleaned.   °· The skin may be numbed with medicine. °· You may be given pain medicine or a medicine to help you relax (sedative).   °· A small needle will be inserted in the skin until it enters the space that contains the spinal fluid. The needle will not enter the spinal cord.   °· The spinal fluid will be collected into tubes.   °· The needle will be withdrawn, and a bandage will be placed on the site.   °AFTER THE PROCEDURE °· You will remain lying down for 1 hour or for as long as your health care provider suggests.   °· The spinal fluid will be sent to a laboratory to be examined. The results of the examination may be available before you go home. °· A test, called a culture, may be taken of the spinal fluid if your health care provider thinks you have an infection. If cultures   were taken for exam, the results will usually be available in a couple of days.  Document Released: 03/17/2000 Document Revised: 01/08/2013 Document Reviewed: 11/25/2012 [employer] Patient Information 2014 [employer].  

## 2013-08-07 NOTE — Procedures (Signed)
Diagnostic lumbar puncture performed without immediate complications. 15 ml CSF withdrawn for the requested studies. Details are dictated in the radiology note.

## 2013-08-10 LAB — CSF CULTURE W GRAM STAIN: Special Requests: NORMAL

## 2013-08-10 LAB — CSF CULTURE: CULTURE: NO GROWTH

## 2013-08-10 LAB — ANGIOTENSIN CONVERTING ENZYME, CSF: Angio Convert Enzyme: 5 U/L (ref <=15)

## 2013-08-11 ENCOUNTER — Other Ambulatory Visit: Payer: Self-pay | Admitting: Neurology

## 2013-08-11 ENCOUNTER — Telehealth: Payer: Self-pay | Admitting: Neurology

## 2013-08-11 MED ORDER — TOPIRAMATE 25 MG PO TABS: 25.0000 mg | ORAL_TABLET | Freq: Every day | ORAL | Status: DC

## 2013-08-11 NOTE — Telephone Encounter (Signed)
Called patient with instructions to start Topamax 25 mg daily in addition to her current medications. I'm reluctant to increase her Diamox due to paresthesias she is already experiencing and her recent blood tests by her primary care doctor shows mild elevation in creatinine so I will hold off on further increasing the Lasix.    Donika K. Posey Pronto, DO

## 2013-08-11 NOTE — Telephone Encounter (Signed)
Message copied by Alda Berthold on Mon Aug 11, 2013  4:57 PM ------      Message from: Su Hoff H      Created: Mon Aug 11, 2013  4:30 PM       I spoke with patient and she said that she still has a headache.  On a scale of 1 to 10, it is an 8.  Please advise. ------

## 2013-08-12 NOTE — Telephone Encounter (Signed)
Ok for 90 day supply.  

## 2013-09-15 ENCOUNTER — Encounter: Payer: Self-pay | Admitting: Neurology

## 2013-09-15 NOTE — Telephone Encounter (Signed)
I called patient and moved appt to the end of the month. Thanks  Hinton Dyer

## 2013-09-19 ENCOUNTER — Ambulatory Visit: Payer: BC Managed Care – PPO | Admitting: Neurology

## 2013-09-29 ENCOUNTER — Encounter: Payer: Self-pay | Admitting: Neurology

## 2013-09-30 ENCOUNTER — Ambulatory Visit: Payer: BC Managed Care – PPO | Admitting: Neurology

## 2013-11-12 ENCOUNTER — Other Ambulatory Visit: Payer: Self-pay | Admitting: Obstetrics and Gynecology

## 2013-11-13 LAB — CYTOLOGY - PAP

## 2014-01-16 ENCOUNTER — Other Ambulatory Visit: Payer: Self-pay

## 2014-05-01 ENCOUNTER — Ambulatory Visit (HOSPITAL_BASED_OUTPATIENT_CLINIC_OR_DEPARTMENT_OTHER): Payer: BC Managed Care – PPO | Admitting: Anesthesiology

## 2014-05-01 ENCOUNTER — Other Ambulatory Visit: Payer: Self-pay | Admitting: Orthopedic Surgery

## 2014-05-01 ENCOUNTER — Encounter (HOSPITAL_BASED_OUTPATIENT_CLINIC_OR_DEPARTMENT_OTHER): Payer: Self-pay | Admitting: *Deleted

## 2014-05-01 ENCOUNTER — Ambulatory Visit (HOSPITAL_BASED_OUTPATIENT_CLINIC_OR_DEPARTMENT_OTHER)
Admission: AD | Admit: 2014-05-01 | Discharge: 2014-05-01 | Disposition: A | Discharge status: Home / Self Care | Payer: BC Managed Care – PPO | Source: Ambulatory Visit | Attending: Orthopedic Surgery | Admitting: Orthopedic Surgery

## 2014-05-01 ENCOUNTER — Encounter (HOSPITAL_BASED_OUTPATIENT_CLINIC_OR_DEPARTMENT_OTHER)
Admission: AD | Disposition: A | Discharge status: Home / Self Care | Payer: Self-pay | Source: Ambulatory Visit | Attending: Orthopedic Surgery

## 2014-05-01 DIAGNOSIS — Z888 Allergy status to other drugs, medicaments and biological substances status: Secondary | ICD-10-CM | POA: Insufficient documentation

## 2014-05-01 DIAGNOSIS — Z91018 Allergy to other foods: Secondary | ICD-10-CM | POA: Insufficient documentation

## 2014-05-01 DIAGNOSIS — E119 Type 2 diabetes mellitus without complications: Secondary | ICD-10-CM | POA: Insufficient documentation

## 2014-05-01 DIAGNOSIS — M65321 Trigger finger, right index finger: Secondary | ICD-10-CM | POA: Diagnosis not present

## 2014-05-01 DIAGNOSIS — E669 Obesity, unspecified: Secondary | ICD-10-CM | POA: Insufficient documentation

## 2014-05-01 DIAGNOSIS — J45909 Unspecified asthma, uncomplicated: Secondary | ICD-10-CM | POA: Diagnosis not present

## 2014-05-01 HISTORY — PX: INCISION AND DRAINAGE: SHX5863

## 2014-05-01 LAB — POCT I-STAT, CHEM 8
BUN: 11 mg/dL (ref 6–23)
Calcium, Ion: 1.16 mmol/L (ref 1.12–1.23)
Chloride: 105 mmol/L (ref 96–112)
Creatinine, Ser: 0.9 mg/dL (ref 0.50–1.10)
Glucose, Bld: 84 mg/dL (ref 70–99)
HCT: 40 % (ref 36.0–46.0)
Hemoglobin: 13.6 g/dL (ref 12.0–15.0)
Potassium: 4 mmol/L (ref 3.5–5.1)
Sodium: 141 mmol/L (ref 135–145)
TCO2: 22 mmol/L (ref 0–100)

## 2014-05-01 LAB — GRAM STAIN

## 2014-05-01 SURGERY — INCISION AND DRAINAGE
Anesthesia: General | Site: Finger | Laterality: Right

## 2014-05-01 MED ORDER — MIDAZOLAM HCL 2 MG/2ML IJ SOLN
INTRAMUSCULAR | Status: AC
  Filled 2014-05-01: qty 2

## 2014-05-01 MED ORDER — SCOPOLAMINE 1 MG/3DAYS TD PT72
1.0000 | MEDICATED_PATCH | TRANSDERMAL | Status: DC
  Administered 2014-05-01: 1.5 mg via TRANSDERMAL

## 2014-05-01 MED ORDER — MIDAZOLAM HCL 2 MG/2ML IJ SOLN: 1.0000 mg | INTRAMUSCULAR | Status: DC | PRN

## 2014-05-01 MED ORDER — SCOPOLAMINE 1 MG/3DAYS TD PT72
MEDICATED_PATCH | TRANSDERMAL | Status: AC
  Filled 2014-05-01: qty 1

## 2014-05-01 MED ORDER — BUPIVACAINE HCL (PF) 0.25 % IJ SOLN
INTRAMUSCULAR | Status: DC | PRN
  Administered 2014-05-01: 4 mL

## 2014-05-01 MED ORDER — DEXAMETHASONE SODIUM PHOSPHATE 4 MG/ML IJ SOLN
INTRAMUSCULAR | Status: DC | PRN
  Administered 2014-05-01: 10 mg via INTRAVENOUS

## 2014-05-01 MED ORDER — PROMETHAZINE HCL 25 MG/ML IJ SOLN: 6.2500 mg | INTRAMUSCULAR | Status: DC | PRN

## 2014-05-01 MED ORDER — LIDOCAINE HCL (PF) 1 % IJ SOLN
INTRAMUSCULAR | Status: AC
  Filled 2014-05-01: qty 30

## 2014-05-01 MED ORDER — FENTANYL CITRATE 0.05 MG/ML IJ SOLN: 50.0000 ug | INTRAMUSCULAR | Status: DC | PRN

## 2014-05-01 MED ORDER — PROPOFOL 10 MG/ML IV BOLUS
INTRAVENOUS | Status: DC | PRN
  Administered 2014-05-01: 200 mg via INTRAVENOUS

## 2014-05-01 MED ORDER — FENTANYL CITRATE 0.05 MG/ML IJ SOLN
INTRAMUSCULAR | Status: DC | PRN
Start: 2014-05-01 — End: 2014-05-01
  Administered 2014-05-01: 100 ug via INTRAVENOUS

## 2014-05-01 MED ORDER — OXYCODONE HCL 5 MG PO TABS
5.0000 mg | ORAL_TABLET | Freq: Once | ORAL | Status: AC | PRN
Start: 2014-05-01 — End: 2014-05-01
  Administered 2014-05-01: 5 mg via ORAL

## 2014-05-01 MED ORDER — OXYCODONE-ACETAMINOPHEN 5-325 MG PO TABS: 1.0000 | ORAL_TABLET | ORAL | Status: DC | PRN

## 2014-05-01 MED ORDER — OXYCODONE HCL 5 MG/5ML PO SOLN: 5.0000 mg | Freq: Once | ORAL | Status: AC | PRN

## 2014-05-01 MED ORDER — BUPIVACAINE HCL (PF) 0.25 % IJ SOLN
INTRAMUSCULAR | Status: AC
  Filled 2014-05-01: qty 60

## 2014-05-01 MED ORDER — OXYCODONE HCL 5 MG PO TABS
ORAL_TABLET | ORAL | Status: AC
  Filled 2014-05-01: qty 1

## 2014-05-01 MED ORDER — HYDROMORPHONE HCL 1 MG/ML IJ SOLN
0.2500 mg | INTRAMUSCULAR | Status: DC | PRN
  Administered 2014-05-01 (×2): 0.25 mg via INTRAVENOUS

## 2014-05-01 MED ORDER — LIDOCAINE HCL (CARDIAC) 20 MG/ML IV SOLN
INTRAVENOUS | Status: DC | PRN
  Administered 2014-05-01: 50 mg via INTRAVENOUS

## 2014-05-01 MED ORDER — MIDAZOLAM HCL 5 MG/5ML IJ SOLN
INTRAMUSCULAR | Status: DC | PRN
  Administered 2014-05-01: 2 mg via INTRAVENOUS

## 2014-05-01 MED ORDER — HYDROMORPHONE HCL 1 MG/ML IJ SOLN
INTRAMUSCULAR | Status: AC
  Filled 2014-05-01: qty 1

## 2014-05-01 MED ORDER — LACTATED RINGERS IV SOLN
INTRAVENOUS | Status: DC
  Administered 2014-05-01: 13:00:00 via INTRAVENOUS

## 2014-05-01 MED ORDER — BUPIVACAINE HCL (PF) 0.5 % IJ SOLN
INTRAMUSCULAR | Status: AC
  Filled 2014-05-01: qty 30

## 2014-05-01 MED ORDER — KETOROLAC TROMETHAMINE 30 MG/ML IJ SOLN
INTRAMUSCULAR | Status: DC | PRN
  Administered 2014-05-01: 30 mg via INTRAVENOUS

## 2014-05-01 MED ORDER — FENTANYL CITRATE 0.05 MG/ML IJ SOLN
INTRAMUSCULAR | Status: AC
  Filled 2014-05-01: qty 4

## 2014-05-01 MED ORDER — BUPIVACAINE HCL (PF) 0.5 % IJ SOLN
INTRAMUSCULAR | Status: AC
Start: 2014-05-01 — End: 2014-05-01
  Filled 2014-05-01: qty 30

## 2014-05-01 SURGICAL SUPPLY — 69 items
APL SKNCLS STERI-STRIP NONHPOA (GAUZE/BANDAGES/DRESSINGS)
BAG DECANTER FOR FLEXI CONT (MISCELLANEOUS) IMPLANT
BANDAGE ELASTIC 3 VELCRO ST LF (GAUZE/BANDAGES/DRESSINGS) ×3 IMPLANT
BANDAGE ELASTIC 4 VELCRO ST LF (GAUZE/BANDAGES/DRESSINGS) IMPLANT
BENZOIN TINCTURE PRP APPL 2/3 (GAUZE/BANDAGES/DRESSINGS) IMPLANT
BLADE SURG 15 STRL LF DISP TIS (BLADE) ×1 IMPLANT
BLADE SURG 15 STRL SS (BLADE) ×3
BNDG CMPR 9X4 STRL LF SNTH (GAUZE/BANDAGES/DRESSINGS)
BNDG COHESIVE 1X5 TAN STRL LF (GAUZE/BANDAGES/DRESSINGS) IMPLANT
BNDG ESMARK 4X9 LF (GAUZE/BANDAGES/DRESSINGS) IMPLANT
BNDG GAUZE ELAST 4 BULKY (GAUZE/BANDAGES/DRESSINGS) ×2 IMPLANT
CLOSURE WOUND 1/2 X4 (GAUZE/BANDAGES/DRESSINGS)
CORDS BIPOLAR (ELECTRODE) ×3 IMPLANT
COVER BACK TABLE 60X90IN (DRAPES) ×3 IMPLANT
CUFF TOURNIQUET SINGLE 18IN (TOURNIQUET CUFF) IMPLANT
DECANTER SPIKE VIAL GLASS SM (MISCELLANEOUS) IMPLANT
DRAPE EXTREMITY T 121X128X90 (DRAPE) ×3 IMPLANT
DRAPE SURG 17X23 STRL (DRAPES) ×3 IMPLANT
DURAPREP 26ML APPLICATOR (WOUND CARE) ×3 IMPLANT
GAUZE PACKING IODOFORM 1/4X15 (GAUZE/BANDAGES/DRESSINGS) IMPLANT
GAUZE SPONGE 4X4 12PLY STRL (GAUZE/BANDAGES/DRESSINGS) ×3 IMPLANT
GAUZE XEROFORM 1X8 LF (GAUZE/BANDAGES/DRESSINGS) IMPLANT
GLOVE SURG SYN 8.0 (GLOVE) ×6 IMPLANT
GLOVE SURG SYN 8.0 PF PI (GLOVE) ×2 IMPLANT
GOWN STRL REUS W/ TWL LRG LVL3 (GOWN DISPOSABLE) ×1 IMPLANT
GOWN STRL REUS W/ TWL XL LVL3 (GOWN DISPOSABLE) ×1 IMPLANT
GOWN STRL REUS W/TWL LRG LVL3 (GOWN DISPOSABLE) ×3
GOWN STRL REUS W/TWL XL LVL3 (GOWN DISPOSABLE) ×3
HANDPIECE INTERPULSE COAX TIP (DISPOSABLE)
IV NS IRRIG 3000ML ARTHROMATIC (IV SOLUTION) IMPLANT
LOOP VESSEL MAXI BLUE (MISCELLANEOUS) IMPLANT
NDL HYPO 25X1 1.5 SAFETY (NEEDLE) IMPLANT
NDL PRECISIONGLIDE 27X1.5 (NEEDLE) IMPLANT
NEEDLE HYPO 25X1 1.5 SAFETY (NEEDLE) IMPLANT
NEEDLE PRECISIONGLIDE 27X1.5 (NEEDLE) IMPLANT
NS IRRIG 1000ML POUR BTL (IV SOLUTION) IMPLANT
PACK BASIN DAY SURGERY FS (CUSTOM PROCEDURE TRAY) ×3 IMPLANT
PAD CAST 3X4 CTTN HI CHSV (CAST SUPPLIES) ×1 IMPLANT
PADDING CAST ABS 3INX4YD NS (CAST SUPPLIES) ×2
PADDING CAST ABS 4INX4YD NS (CAST SUPPLIES) ×2
PADDING CAST ABS COTTON 3X4 (CAST SUPPLIES) ×1 IMPLANT
PADDING CAST ABS COTTON 4X4 ST (CAST SUPPLIES) ×1 IMPLANT
PADDING CAST COTTON 3X4 STRL (CAST SUPPLIES) ×3
SET HNDPC FAN SPRY TIP SCT (DISPOSABLE) IMPLANT
SHEET MEDIUM DRAPE 40X70 STRL (DRAPES) ×3 IMPLANT
SPLINT PLASTER CAST XFAST 3X15 (CAST SUPPLIES) IMPLANT
SPLINT PLASTER CAST XFAST 4X15 (CAST SUPPLIES) ×5 IMPLANT
SPLINT PLASTER XTRA FAST SET 4 (CAST SUPPLIES) ×10
SPLINT PLASTER XTRA FASTSET 3X (CAST SUPPLIES)
STOCKINETTE 4X48 STRL (DRAPES) ×3 IMPLANT
STRIP CLOSURE SKIN 1/2X4 (GAUZE/BANDAGES/DRESSINGS) IMPLANT
SUT ETHILON 3 0 PS 1 (SUTURE) IMPLANT
SUT ETHILON 4 0 PS 2 18 (SUTURE) ×2 IMPLANT
SUT ETHILON 5 0 PS 2 18 (SUTURE) IMPLANT
SUT PROLENE 3 0 PS 2 (SUTURE) IMPLANT
SUT VIC AB 4-0 P-3 18XBRD (SUTURE) IMPLANT
SUT VIC AB 4-0 P3 18 (SUTURE)
SUT VICRYL RAPIDE 4-0 (SUTURE) IMPLANT
SUT VICRYL RAPIDE 4/0 PS 2 (SUTURE) IMPLANT
SWAB COLLECTION DEVICE MRSA (MISCELLANEOUS) IMPLANT
SYR BULB 3OZ (MISCELLANEOUS) ×3 IMPLANT
SYR CONTROL 10ML LL (SYRINGE) IMPLANT
SYRINGE 10CC LL (SYRINGE) ×3 IMPLANT
TOWEL OR 17X24 6PK STRL BLUE (TOWEL DISPOSABLE) ×3 IMPLANT
TUBE ANAEROBIC SPECIMEN COL (MISCELLANEOUS) IMPLANT
TUBE CONNECTING 20'X1/4 (TUBING)
TUBE CONNECTING 20X1/4 (TUBING) IMPLANT
UNDERPAD 30X30 INCONTINENT (UNDERPADS AND DIAPERS) ×3 IMPLANT
YANKAUER SUCT BULB TIP NO VENT (SUCTIONS) IMPLANT

## 2014-05-01 NOTE — Discharge Instructions (Signed)

## 2014-05-01 NOTE — Anesthesia Postprocedure Evaluation (Signed)
Anesthesia Post Note  Patient: Denise Cook  Procedure(s) Performed: Procedure(s) (LRB): INCISION AND DRAINAGE RIGHT INDEX FLEXOR SHEATH   (Right)  Anesthesia type: general  Patient location: PACU  Post pain: Pain level controlled  Post assessment: Patient's Cardiovascular Status Stable  Last Vitals:  Filed Vitals:   05/01/14 1430  BP: 109/69  Pulse: 74  Temp:   Resp: 15    Post vital signs: Reviewed and stable  Level of consciousness: sedated  Complications: No apparent anesthesia complications

## 2014-05-01 NOTE — H&P (Signed)
Denise Cook is an 39 y.o. female.   Chief Complaint: right index pain times several days HPI: as above with worsening pain and swelling on volar aspect of right index finger  Past Medical History  Diagnosis Date  . Asthma   . Sarcoidosis 2012  . Obesity   . Pseudotumor cerebri   . Diabetes mellitus     diet control    Past Surgical History  Procedure Laterality Date  . Abdominal hysterectomy    . Cholecystectomy    . Left eye surgery    . Eye surgery Left     Removed vitreous membrane  . Cardiac catheterization      Family History  Problem Relation Age of Onset  . Adopted: Yes  . Healthy Daughter   . Cancer Maternal Grandmother   . Alcoholism Mother   . Diabetes Paternal Grandmother    Social History:  reports that she has never smoked. She has never used smokeless tobacco. She reports that she does not drink alcohol or use illicit drugs.  Allergies:  Allergies  Allergen Reactions  . Metformin And Related Itching  . Strawberry Itching and Swelling    Medications Prior to Admission  Medication Sig Dispense Refill  . lisinopril (PRINIVIL,ZESTRIL) 5 MG tablet Take 5 mg by mouth daily.    . prednisoLONE acetate (PRED FORTE) 1 % ophthalmic suspension Place 1 drop into both eyes 4 (four) times daily.    Marland Kitchen albuterol-ipratropium (COMBIVENT) 18-103 MCG/ACT inhaler Inhale 2 puffs into the lungs every 4 (four) hours as needed. For shortness of breath and wheezing     . SUMAtriptan (IMITREX) 50 MG tablet May repeat in 2 hours if headache persists or recurs. 10 tablet 0    Results for orders placed or performed during the hospital encounter of 05/01/14 (from the past 48 hour(s))  I-STAT, chem 8     Status: None   Collection Time: 05/01/14 12:41 PM  Result Value Ref Range   Sodium 141 135 - 145 mmol/L   Potassium 4.0 3.5 - 5.1 mmol/L   Chloride 105 96 - 112 mmol/L   BUN 11 6 - 23 mg/dL   Creatinine, Ser 0.90 0.50 - 1.10 mg/dL   Glucose, Bld 84 70 - 99 mg/dL   Calcium, Ion 1.16 1.12 - 1.23 mmol/L   TCO2 22 0 - 100 mmol/L   Hemoglobin 13.6 12.0 - 15.0 g/dL   HCT 40.0 36.0 - 46.0 %   No results found.  Review of Systems  All other systems reviewed and are negative.   Blood pressure 134/88, pulse 73, temperature 98.5 F (36.9 C), temperature source Oral, resp. rate 16, height 5' 1.5" (1.562 m), weight 130.182 kg (287 lb), SpO2 98 %. Physical Exam  Constitutional: She is oriented to person, place, and time. She appears well-developed and well-nourished.  HENT:  Head: Normocephalic and atraumatic.  Cardiovascular: Normal rate.   Respiratory: Effort normal.  Musculoskeletal:       Right hand: She exhibits tenderness and swelling.  Right index flexor sheath pain and swelling with positive kanavels signs  Neurological: She is alert and oriented to person, place, and time.  Skin: Skin is warm.  Psychiatric: She has a normal mood and affect. Her behavior is normal. Judgment and thought content normal.     Assessment/Plan As above   Plan I and D flexor sheath and A-1 pulley release Carmie Lanpher A 05/01/2014, 1:04 PM

## 2014-05-01 NOTE — Op Note (Signed)
See note [phone]

## 2014-05-01 NOTE — Transfer of Care (Signed)
Immediate Anesthesia Transfer of Care Note  Patient: Denise Cook  Procedure(s) Performed: Procedure(s): INCISION AND DRAINAGE RIGHT INDEX FLEXOR SHEATH   (Right)  Patient Location: PACU  Anesthesia Type:General  Level of Consciousness: awake, alert  and oriented  Airway & Oxygen Therapy: Patient Spontanous Breathing and Patient connected to face mask oxygen  Post-op Assessment: Report given to RN and Post -op Vital signs reviewed and stable  Post vital signs: Reviewed and stable  Last Vitals:  Filed Vitals:   05/01/14 1220  BP: 134/88  Pulse: 73  Temp: 36.9 C  Resp: 16    Complications: No apparent anesthesia complications

## 2014-05-01 NOTE — Anesthesia Preprocedure Evaluation (Signed)
Anesthesia Evaluation  Patient identified by MRN, date of birth, ID band Patient awake    Reviewed: Allergy & Precautions, H&P , NPO status , Patient's Chart, lab work & pertinent test results  Airway Mallampati: III  TM Distance: >3 FB Neck ROM: full    Dental  (+) Teeth Intact, Dental Advidsory Given   Pulmonary asthma ,  breath sounds clear to auscultation        Cardiovascular negative cardio ROS  Rhythm:regular Rate:Normal     Neuro/Psych negative neurological ROS  negative psych ROS   GI/Hepatic negative GI ROS, Neg liver ROS,   Endo/Other  diabetes  Renal/GU negative Renal ROS     Musculoskeletal   Abdominal   Peds  Hematology   Anesthesia Other Findings   Reproductive/Obstetrics negative OB ROS                             Anesthesia Physical Anesthesia Plan  ASA: III  Anesthesia Plan: General LMA   Post-op Pain Management:    Induction:   Airway Management Planned:   Additional Equipment:   Intra-op Plan:   Post-operative Plan:   Informed Consent: I have reviewed the patients History and Physical, chart, labs and discussed the procedure including the risks, benefits and alternatives for the proposed anesthesia with the patient or authorized representative who has indicated his/her understanding and acceptance.   Dental Advisory Given  Plan Discussed with: Anesthesiologist, CRNA and Surgeon  Anesthesia Plan Comments:         Anesthesia Quick Evaluation

## 2014-05-01 NOTE — Anesthesia Procedure Notes (Signed)
Procedure Name: LMA Insertion Performed by: Terrance Mass Pre-anesthesia Checklist: Patient identified, Timeout performed, Emergency Drugs available, Suction available and Patient being monitored Patient Re-evaluated:Patient Re-evaluated prior to inductionOxygen Delivery Method: Circle system utilized Preoxygenation: Pre-oxygenation with 100% oxygen Intubation Type: IV induction Ventilation: Mask ventilation without difficulty LMA: LMA with gastric port inserted LMA Size: 4.0 Tube type: Oral Number of attempts: 1 Placement Confirmation: positive ETCO2 Tube secured with: Tape Dental Injury: Teeth and Oropharynx as per pre-operative assessment

## 2014-05-03 LAB — WOUND CULTURE: Culture: NO GROWTH

## 2014-05-04 ENCOUNTER — Encounter (HOSPITAL_BASED_OUTPATIENT_CLINIC_OR_DEPARTMENT_OTHER): Payer: Self-pay | Admitting: Orthopedic Surgery

## 2014-05-04 NOTE — Addendum Note (Signed)
Addendum  created 05/04/14 1253 by Tawni Millers, CRNA   Modules edited: Charges VN

## 2014-05-04 NOTE — Op Note (Signed)
NAME:  ARIEN, MORINE           ACCOUNT NO.:  [id number]  MEDICAL RECORD NO.:  [id number]  LOCATION:                                 FACILITY:  PHYSICIAN:  Sheral Apley. Neyland Pettengill, M.D.DATE OF BIRTH:  04/26/1975  DATE OF PROCEDURE:  05/01/2014 DATE OF DISCHARGE:  05/01/2014                              OPERATIVE REPORT   PREOPERATIVE DIAGNOSIS:  Right index finger flexor sheath synovitis/infection and triggering.  POSTOPERATIVE DIAGNOSIS:  Right index finger flexor sheath synovitis/infection and triggering.  PROCEDURE:  Incision and drainage of right index finger flexor sheath, A1 pulley release, and limited flexor synovectomy.  SURGEON:  Sheral Apley. Burney Gauze, M.D.  ASSISTANT:  None.  ANESTHESIA:  General.  COMPLICATION:  None.  DRAINS:  None.  CULTURES:  Were sent.  DESCRIPTION OF PROCEDURE:  The patient was taken to the operating suite. After induction of adequate general anesthetic, right upper extremity was prepped and draped in sterile fashion.  An Esmarch was used to exsanguinate the limb.  Tourniquet inflated to 250 mmHg.  At this point, an oblique incision was made in the mid palmar area over the A1 pulley of the index finger on the right.  We started distal radial and proximal ulnar, identified the radial digital nerve.  The flexor sheath was identified, fluid was seen coming out of flexor sheath.  This was cultured for aerobic, anaerobic, and a stat gram stain.  We then made a midlateral incision on the index finger along the ulnar side at the DIP flexion crease, dissected down the flexor sheath and made a small incision in the flexor sheath in between the A4 and A5.  We then placed a #5 pediatric feeding tube into the flexor sheath proximally and irrigated 50 mL of normal saline.  The irrigant was clear the entire way.  We then took the A1 pulley and split it.  We did a limited flexor synovectomy of profundus superficialis tendons.  The wound was  irrigated and loosely closed with 4-0 nylon.  One stitch in the mid-lateral incision, 2 stitches in the A1 pulley area incision.  We also infiltrated Marcaine in the field before wound closure for pain control.  Xeroform, 4x4s, fluffs, and a compressive dressing was applied.  The patient tolerated all procedures well in concealed fashion.     Sheral Apley Burney Gauze, M.D.     MAW/MEDQ  D:  05/01/2014  T:  05/02/2014  Job:  [id number]

## 2014-05-06 LAB — ANAEROBIC CULTURE

## 2014-06-17 ENCOUNTER — Encounter (HOSPITAL_COMMUNITY): Payer: Self-pay

## 2014-06-17 ENCOUNTER — Emergency Department (HOSPITAL_COMMUNITY)
Admission: EM | Admit: 2014-06-17 | Discharge: 2014-06-17 | Disposition: A | Discharge status: Home / Self Care | Payer: BC Managed Care – PPO | Attending: Emergency Medicine | Admitting: Emergency Medicine

## 2014-06-17 ENCOUNTER — Emergency Department (HOSPITAL_COMMUNITY): Payer: BC Managed Care – PPO

## 2014-06-17 DIAGNOSIS — R079 Chest pain, unspecified: Secondary | ICD-10-CM | POA: Diagnosis present

## 2014-06-17 DIAGNOSIS — J45909 Unspecified asthma, uncomplicated: Secondary | ICD-10-CM | POA: Diagnosis not present

## 2014-06-17 DIAGNOSIS — R0789 Other chest pain: Secondary | ICD-10-CM

## 2014-06-17 DIAGNOSIS — Z9889 Other specified postprocedural states: Secondary | ICD-10-CM | POA: Insufficient documentation

## 2014-06-17 DIAGNOSIS — E669 Obesity, unspecified: Secondary | ICD-10-CM | POA: Insufficient documentation

## 2014-06-17 DIAGNOSIS — Z8669 Personal history of other diseases of the nervous system and sense organs: Secondary | ICD-10-CM | POA: Insufficient documentation

## 2014-06-17 DIAGNOSIS — E119 Type 2 diabetes mellitus without complications: Secondary | ICD-10-CM | POA: Insufficient documentation

## 2014-06-17 DIAGNOSIS — Z862 Personal history of diseases of the blood and blood-forming organs and certain disorders involving the immune mechanism: Secondary | ICD-10-CM | POA: Insufficient documentation

## 2014-06-17 LAB — BASIC METABOLIC PANEL
ANION GAP: 9 (ref 5–15)
BUN: 10 mg/dL (ref 6–23)
CALCIUM: 9.5 mg/dL (ref 8.4–10.5)
CO2: 27 mmol/L (ref 19–32)
Chloride: 101 mmol/L (ref 96–112)
Creatinine, Ser: 0.99 mg/dL (ref 0.50–1.10)
GFR calc non Af Amer: 71 mL/min — ABNORMAL LOW (ref >90)
GFR, EST AFRICAN AMERICAN: 82 mL/min — AB (ref >90)
Glucose, Bld: 107 mg/dL — ABNORMAL HIGH (ref 70–99)
Potassium: 3.8 mmol/L (ref 3.5–5.1)
SODIUM: 137 mmol/L (ref 135–145)

## 2014-06-17 LAB — CBC
HEMATOCRIT: 39 % (ref 36.0–46.0)
Hemoglobin: 12.8 g/dL (ref 12.0–15.0)
MCH: 27.3 pg (ref 26.0–34.0)
MCHC: 32.8 g/dL (ref 30.0–36.0)
MCV: 83.2 fL (ref 78.0–100.0)
Platelets: 378 10*3/uL (ref 150–400)
RBC: 4.69 MIL/uL (ref 3.87–5.11)
RDW: 14.6 % (ref 11.5–15.5)
WBC: 11.1 10*3/uL — AB (ref 4.0–10.5)

## 2014-06-17 LAB — I-STAT TROPONIN, ED
Troponin i, poc: 0 ng/mL (ref 0.00–0.08)
Troponin i, poc: 0 ng/mL (ref 0.00–0.08)

## 2014-06-17 NOTE — ED Provider Notes (Signed)
CSN: [id number]     Arrival date & time 06/17/14  1726 History   First MD Initiated Contact with Patient 06/17/14 2021     Chief Complaint  Patient presents with  . Chest Pain     (Consider location/radiation/quality/duration/timing/severity/associated sxs/prior Treatment) Patient is a 39 y.o. female presenting with chest pain. The history is provided by the patient.  Chest Pain Pain location:  L chest Pain quality: sharp   Pain radiates to:  Does not radiate Pain radiates to the back: no   Pain severity:  Severe Onset quality:  Sudden Duration:  45 minutes Timing:  Constant Progression:  Resolved Chronicity:  New Context comment:  Walking and talking on the phone Relieved by:  Nothing Worsened by:  Nothing tried Ineffective treatments:  None tried Associated symptoms: no cough, no fever, no nausea, no numbness, no shortness of breath, not vomiting and no weakness   Risk factors: diabetes mellitus     Past Medical History  Diagnosis Date  . Asthma   . Sarcoidosis 2012  . Obesity   . Pseudotumor cerebri   . Diabetes mellitus     diet control   Past Surgical History  Procedure Laterality Date  . Abdominal hysterectomy    . Cholecystectomy    . Left eye surgery    . Eye surgery Left     Removed vitreous membrane  . Cardiac catheterization    . Incision and drainage Right 05/01/2014    Procedure: INCISION AND DRAINAGE RIGHT INDEX FLEXOR SHEATH  ;  Surgeon: Charlotte Crumb, MD;  Location: [facility];  Service: Orthopedics;  Laterality: Right;   Family History  Problem Relation Age of Onset  . Adopted: Yes  . Healthy Daughter   . Cancer Maternal Grandmother   . Alcoholism Mother   . Diabetes Paternal Grandmother    History  Substance Use Topics  . Smoking status: Never Smoker   . Smokeless tobacco: Never Used  . Alcohol Use: No   OB History    No data available     Review of Systems  Constitutional: Negative for fever.  Respiratory:  Negative for cough and shortness of breath.   Cardiovascular: Positive for chest pain.  Gastrointestinal: Negative for nausea and vomiting.  Neurological: Negative for weakness and numbness.  All other systems reviewed and are negative.     Allergies  Metformin and related and Strawberry  Home Medications   Prior to Admission medications   Medication Sig Start Date End Date Taking? Authorizing Provider  acetaminophen (TYLENOL) 500 MG tablet Take 1,000 mg by mouth every 6 (six) hours as needed for headache.   Yes Historical Provider, MD  aspirin-acetaminophen-caffeine (EXCEDRIN MIGRAINE) [phone] MG per tablet Take 1 tablet by mouth every 6 (six) hours as needed for headache.   Yes Historical Provider, MD  Aspirin-Acetaminophen-Caffeine (GOODY HEADACHE PO) Take 1 Package by mouth as needed (for pain).   Yes Historical Provider, MD  oxyCODONE-acetaminophen (ROXICET) 5-325 MG per tablet Take 1 tablet by mouth every 4 (four) hours as needed for severe pain. 05/01/14   Charlotte Crumb, MD  SUMAtriptan (IMITREX) 50 MG tablet May repeat in 2 hours if headache persists or recurs. 07/28/13   Donika K Patel, DO   BP 121/73 mmHg  Pulse 81  Temp(Src) 98.6 F (37 C) (Oral)  Resp 18  SpO2 100% Physical Exam  Constitutional: She appears well-developed and well-nourished. No distress.  HENT:  Head: Normocephalic and atraumatic.  Mouth/Throat: Oropharynx is clear and moist.  No oropharyngeal exudate.  Eyes: Pupils are equal, round, and reactive to light.  Neck: Normal range of motion. Neck supple.  Cardiovascular: Normal rate, regular rhythm, normal heart sounds and intact distal pulses.  Exam reveals no gallop and no friction rub.   No murmur heard. Pulmonary/Chest: Effort normal and breath sounds normal. No respiratory distress. She has no wheezes. She has no rales. She exhibits no tenderness.  Abdominal: Soft. She exhibits no distension. There is no tenderness.  Musculoskeletal: Normal range  of motion. She exhibits no edema.  Lymphadenopathy:    She has no cervical adenopathy.  Skin: Skin is warm and dry. No rash noted. She is not diaphoretic.  Psychiatric: She has a normal mood and affect. Her behavior is normal. Judgment and thought content normal.  Nursing note and vitals reviewed.   ED Course  Procedures (including critical care time) Labs Review Labs Reviewed  CBC - Abnormal; Notable for the following:    WBC 11.1 (*)    All other components within normal limits  BASIC METABOLIC PANEL - Abnormal; Notable for the following:    Glucose, Bld 107 (*)    GFR calc non Af Amer 71 (*)    GFR calc Af Amer 82 (*)    All other components within normal limits  I-STAT TROPOININ, ED  Randolm Idol, ED    Imaging Review Dg Chest 2 View  06/17/2014   CLINICAL DATA:  Acute onset chest pain radiating into left arm occurring earlier today  EXAM: CHEST  2 VIEW  COMPARISON:  July 03, 2013  FINDINGS: Lungs are clear. Heart size and pulmonary vascularity are normal. No adenopathy. No pneumothorax. No bone lesions.  IMPRESSION: No edema or consolidation.   Electronically Signed   By: Lowella Grip III M.D.   On: 06/17/2014 19:09     EKG Interpretation   Date/Time:  Wednesday June 17 2014 17:35:56 EDT Ventricular Rate:  95 PR Interval:  154 QRS Duration: 74 QT Interval:  338 QTC Calculation: 424 R Axis:   24 Text Interpretation:  Normal sinus rhythm Normal ECG No significant change  since last tracing Confirmed by Canary Brim  MD, MARTHA [phone]) on 06/17/2014  8:58:49 PM      MDM   Final diagnoses:  Chest pain of unknown etiology    39 year old female with a past medical history of diabetes as well as sarcoid who presents with chest pain. Single episode of sharp left-sided chest pain at 4 PM this afternoon. No associated shortness of breath, nausea, vomiting. She does endorse some heaviness of her left arm that occurred with that. Symptoms lasted for about 45 minutes and  then self resolved.  On arrival patient is afebrile and he is stable. Not hypoxic with clear breath sounds bilaterally. EKG with normal sinus rhythm and no ischemic changes. Chest x-ray without consolidation, effusion, widened mediastinum, pneumothorax. Basic labs including troponin negative. She has a heart score of 1 and history is atypical ACS. PERC negative and no further workup for PE will be pursued. We will obtain a delta troponin and the patient should be stable for outpatient follow-up with her PCP  10:21 PM Delta troponin negative. Stable for d/c. PCP follow up and ED return precautions for chest pain discussed.  Larence Penning, MD 06/17/14 0076  Alfonzo Beers, MD 06/17/14 [phone]

## 2014-06-17 NOTE — Discharge Instructions (Signed)

## 2014-06-17 NOTE — ED Notes (Signed)
Phlebotomy at bedside.

## 2014-06-17 NOTE — ED Notes (Signed)
MD Harlow Mares at bedside.

## 2014-06-17 NOTE — ED Notes (Signed)
Pt reports chest pain that started about an hr and a half ago.  Pt sts she was walking down the hallway and talking on the phone when the pain started.  Sts pain radiated down her left arm and worried her.  Evaluated by EMS but refused transportation to ER.  Pt continuing to have pain; 8/10.

## 2014-06-17 NOTE — ED Notes (Signed)
Pt a/o x 4 on d/c with family. 

## 2014-06-30 ENCOUNTER — Encounter: Payer: Self-pay | Admitting: Neurology

## 2014-08-21 IMAGING — CR DG CHEST 2V
2 series · 2 of 2 positions shown · non-contrast
Comparison: 07/28/2011

CLINICAL DATA: Shortness of breath, fever

EXAM:
CHEST  2 VIEW

[view not recorded (1 of 2)]
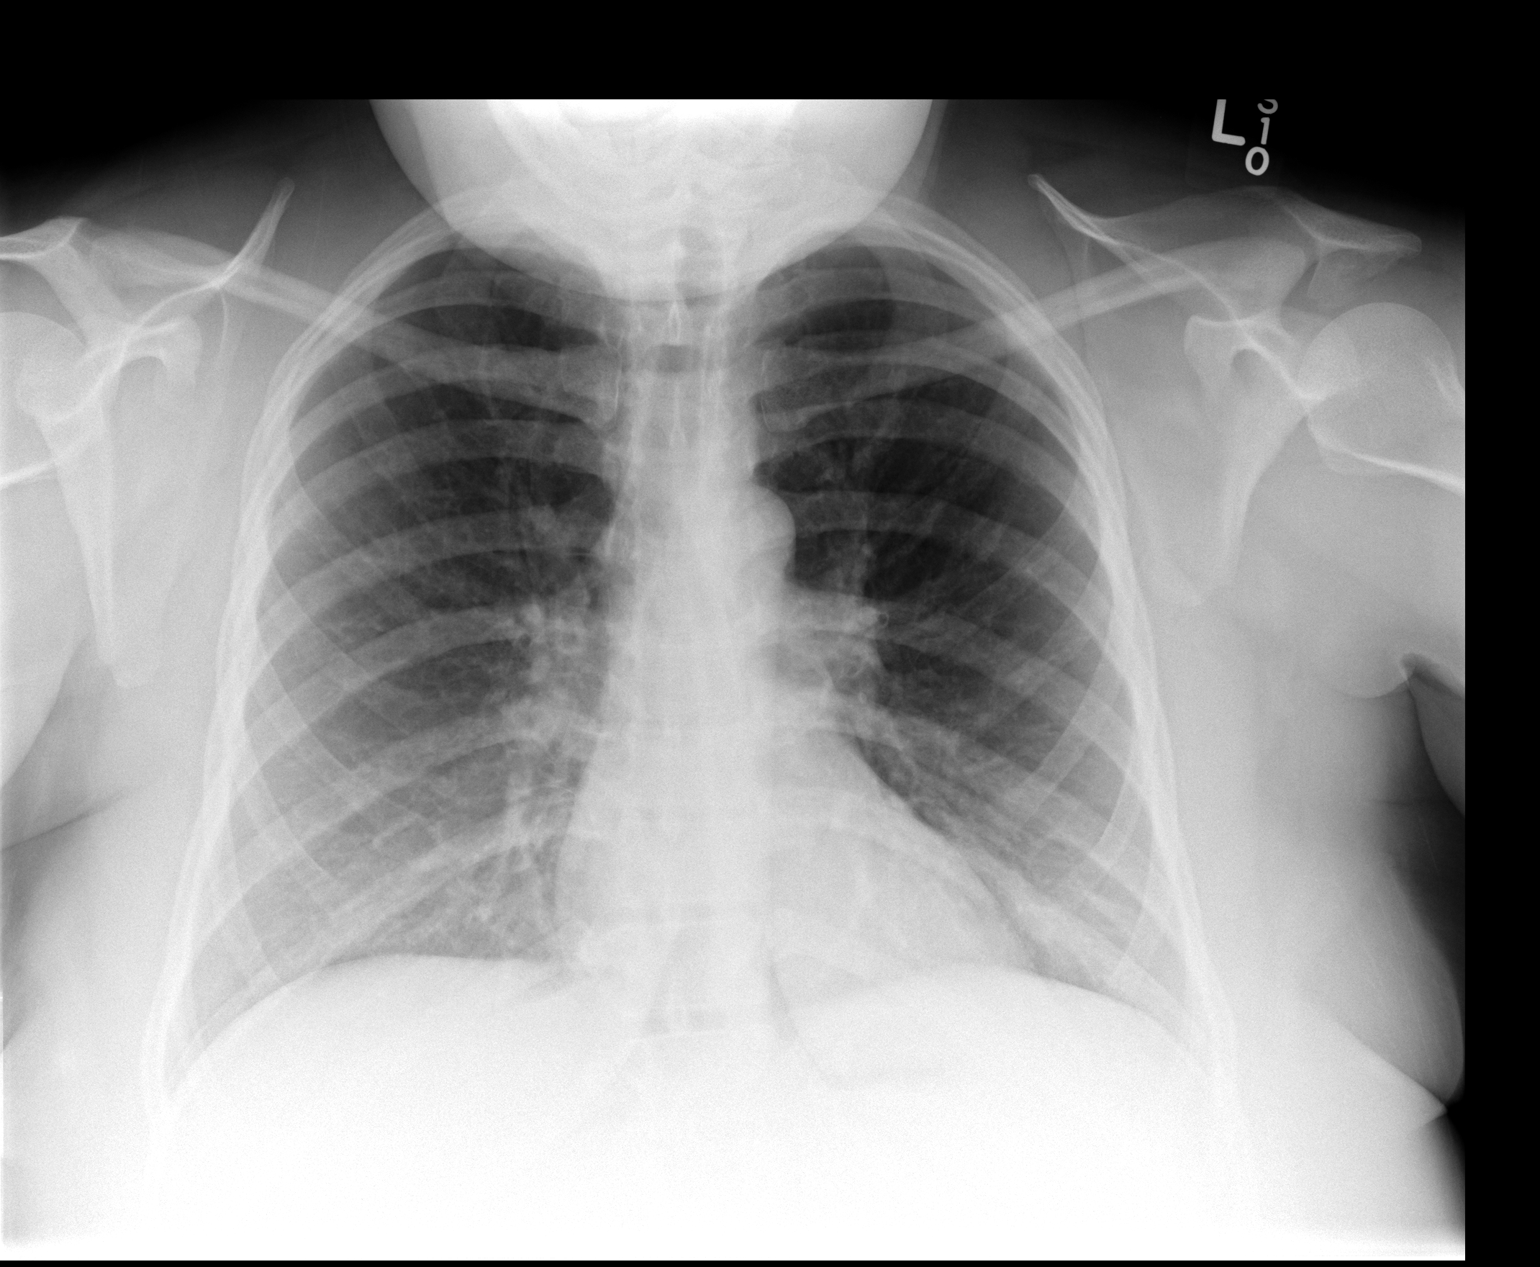

[view not recorded (2 of 2)]
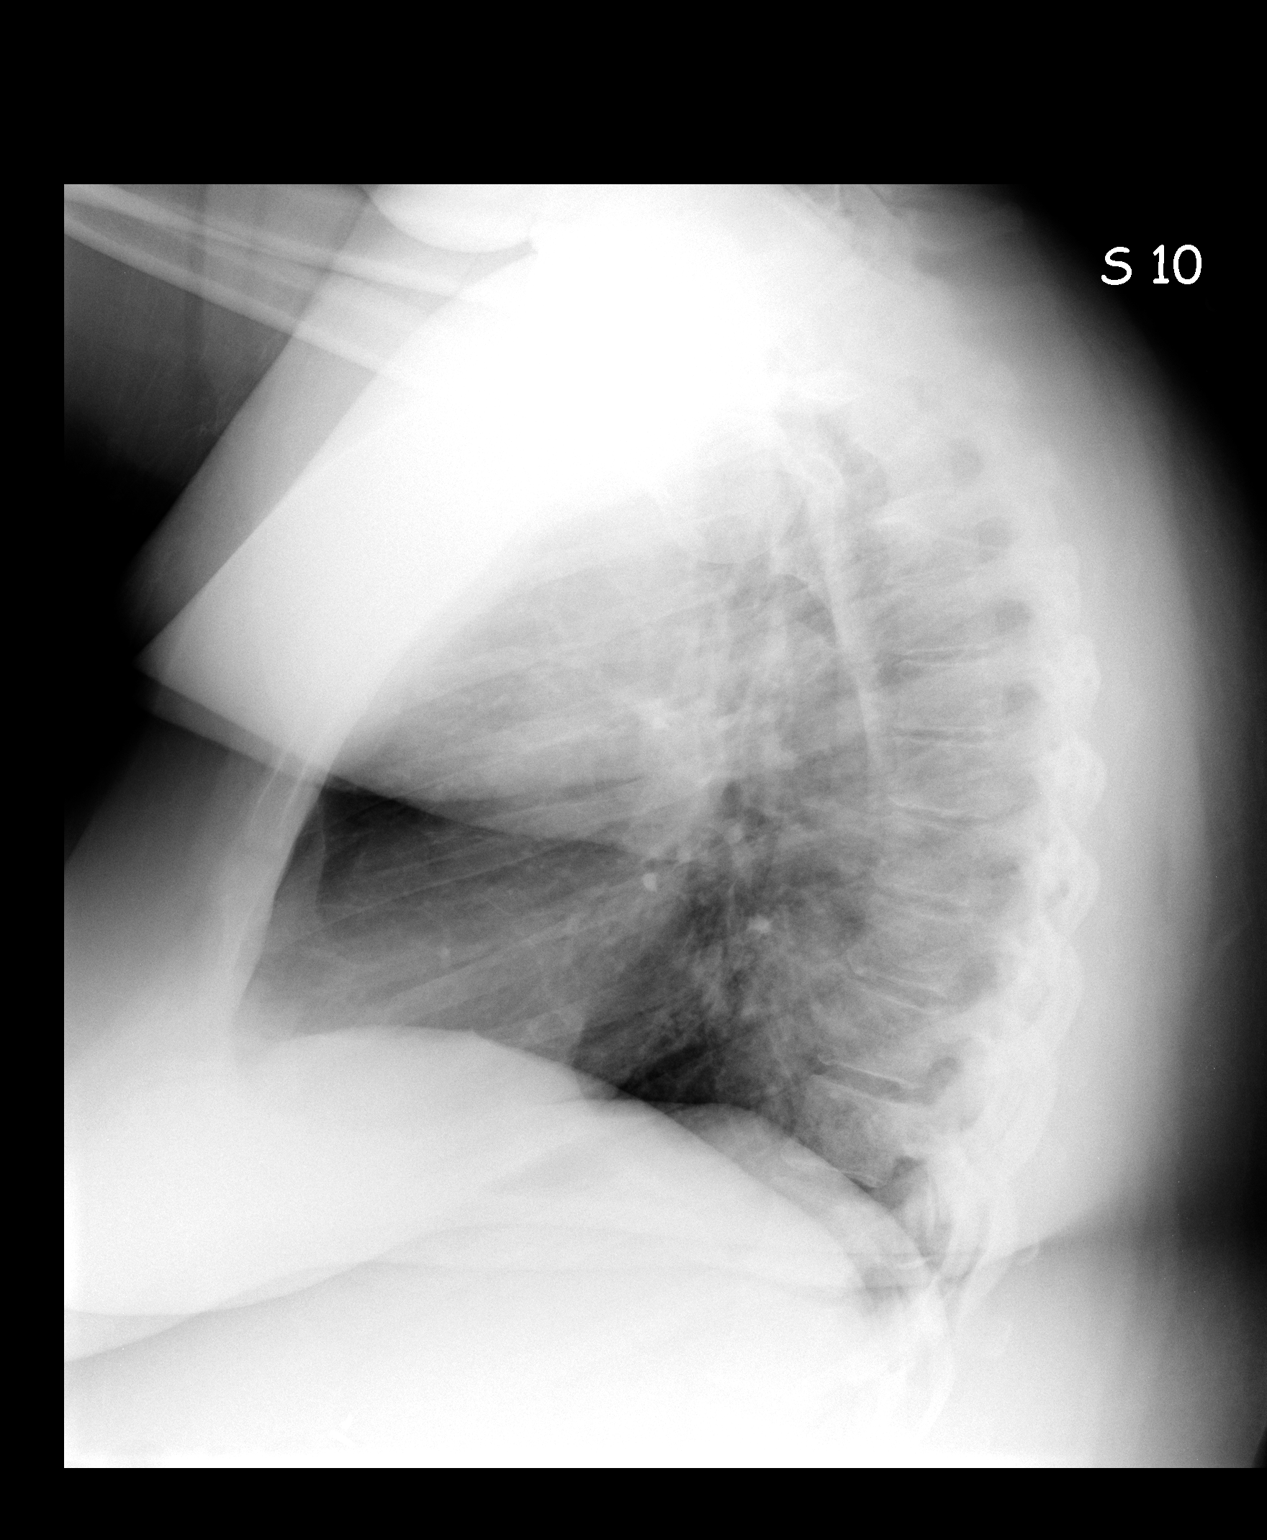

[2 of 2 positions shown; findings below may reference images not displayed]

FINDINGS: Cardiomediastinal silhouette is stable. No acute infiltrate or
pleural effusion. No pulmonary edema. Bony thorax is unremarkable.
IMPRESSION: No active cardiopulmonary disease.

## 2014-11-20 DIAGNOSIS — H40041 Steroid responder, right eye: Secondary | ICD-10-CM | POA: Insufficient documentation

## 2014-11-20 DIAGNOSIS — H44113 Panuveitis, bilateral: Secondary | ICD-10-CM | POA: Insufficient documentation

## 2014-11-20 DIAGNOSIS — H4043X2 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, bilateral, moderate stage: Secondary | ICD-10-CM | POA: Insufficient documentation

## 2014-12-18 DIAGNOSIS — D869 Sarcoidosis, unspecified: Secondary | ICD-10-CM | POA: Insufficient documentation

## 2014-12-18 DIAGNOSIS — Z79899 Other long term (current) drug therapy: Secondary | ICD-10-CM | POA: Insufficient documentation

## 2015-02-27 ENCOUNTER — Encounter (HOSPITAL_BASED_OUTPATIENT_CLINIC_OR_DEPARTMENT_OTHER): Payer: Self-pay | Admitting: Adult Health

## 2015-02-27 ENCOUNTER — Emergency Department (HOSPITAL_BASED_OUTPATIENT_CLINIC_OR_DEPARTMENT_OTHER)
Admission: EM | Admit: 2015-02-27 | Discharge: 2015-02-28 | Disposition: A | Discharge status: Home / Self Care | Payer: BC Managed Care – PPO | Attending: Emergency Medicine | Admitting: Emergency Medicine

## 2015-02-27 DIAGNOSIS — Z79899 Other long term (current) drug therapy: Secondary | ICD-10-CM | POA: Diagnosis not present

## 2015-02-27 DIAGNOSIS — M545 Low back pain: Secondary | ICD-10-CM | POA: Insufficient documentation

## 2015-02-27 DIAGNOSIS — Z862 Personal history of diseases of the blood and blood-forming organs and certain disorders involving the immune mechanism: Secondary | ICD-10-CM | POA: Diagnosis not present

## 2015-02-27 DIAGNOSIS — Z7982 Long term (current) use of aspirin: Secondary | ICD-10-CM | POA: Diagnosis not present

## 2015-02-27 DIAGNOSIS — E669 Obesity, unspecified: Secondary | ICD-10-CM | POA: Diagnosis not present

## 2015-02-27 DIAGNOSIS — J45909 Unspecified asthma, uncomplicated: Secondary | ICD-10-CM | POA: Insufficient documentation

## 2015-02-27 DIAGNOSIS — M549 Dorsalgia, unspecified: Secondary | ICD-10-CM

## 2015-02-27 DIAGNOSIS — E119 Type 2 diabetes mellitus without complications: Secondary | ICD-10-CM | POA: Diagnosis not present

## 2015-02-27 DIAGNOSIS — Z8669 Personal history of other diseases of the nervous system and sense organs: Secondary | ICD-10-CM | POA: Insufficient documentation

## 2015-02-27 DIAGNOSIS — Z9889 Other specified postprocedural states: Secondary | ICD-10-CM | POA: Diagnosis not present

## 2015-02-27 DIAGNOSIS — Z794 Long term (current) use of insulin: Secondary | ICD-10-CM | POA: Diagnosis not present

## 2015-02-27 NOTE — ED Notes (Signed)
Presents with right sided back pain began 2 weeks ago while walking at work had a sharp pain in right leg and then the leg went numb and felt very heavy, the next day began having right sided lower back opain- using heat and ice at home with some relief. tioday pain began again and right leg feels heavy. Pain is worse than normal today. Movement makes pain worse.

## 2015-02-28 MED ORDER — HYDROMORPHONE HCL 1 MG/ML IJ SOLN
2.0000 mg | Freq: Once | INTRAMUSCULAR | Status: AC
  Administered 2015-02-28: 2 mg via INTRAMUSCULAR
  Filled 2015-02-28: qty 2

## 2015-02-28 MED ORDER — OXYCODONE-ACETAMINOPHEN 5-325 MG PO TABS: 1.0000 | ORAL_TABLET | Freq: Four times a day (QID) | ORAL | Status: DC | PRN

## 2015-02-28 NOTE — Discharge Instructions (Signed)

## 2015-02-28 NOTE — ED Provider Notes (Signed)
CSN: [id number]     Arrival date & time 02/27/15  2230 History  By signing my name below, I, Denise Cook, attest that this documentation has been prepared under the direction and in the presence of Shanon Rosser, MD. Electronically Signed: Helane Cook, ED Scribe. 02/28/2015. 12:27 AM.    Chief Complaint  Patient presents with  . Back Pain   The history is provided by the patient. No language interpreter was used.   HPI Comments: Denise Cook is a 39 y.o. female who presents to the Emergency Department complaining of constant, aching, worsening, right-sided upper and mid-back pain onset yesterday. She currently rates her pain as a 10/10. It is worse with movement of her back. She notes the pain has been intermittent over the past 2 weeks. She also reports associated intermittent numbness and "heavyness" of the right thigh for the past 2 weeks, not currently present. She states that with the previous occurences, she has applied cold and warm compresses, as well as taking tylenol for the pain with effective relief, but notes that this did not relieve the current episode. She also notes that today's pain is worse than with any previous episode.  Past Medical History  Diagnosis Date  . Asthma   . Sarcoidosis ([place]) 2012  . Obesity   . Pseudotumor cerebri   . Diabetes mellitus     diet control   Past Surgical History  Procedure Laterality Date  . Abdominal hysterectomy    . Cholecystectomy    . Left eye surgery    . Eye surgery Left     Removed vitreous membrane  . Cardiac catheterization    . Incision and drainage Right 05/01/2014    Procedure: INCISION AND DRAINAGE RIGHT INDEX FLEXOR SHEATH  ;  Surgeon: Charlotte Crumb, MD;  Location: [facility];  Service: Orthopedics;  Laterality: Right;  . Glaucoma shunt     Family History  Problem Relation Age of Onset  . Adopted: Yes  . Healthy Daughter   . Cancer Maternal Grandmother   . Alcoholism Mother   . Diabetes  Paternal Grandmother    Social History  Substance Use Topics  . Smoking status: Never Smoker   . Smokeless tobacco: Never Used  . Alcohol Use: No   OB History    No data available     Review of Systems  All other systems reviewed and are negative.   Allergies  Metformin and related and Strawberry extract  Home Medications   Prior to Admission medications   Medication Sig Start Date End Date Taking? Authorizing Provider  Albiglutide (TANZEUM) 50 MG PEN Inject into the skin.   Yes Historical Provider, MD  azaTHIOprine (IMURAN) 50 MG tablet Take 50 mg by mouth daily.   Yes Historical Provider, MD  ferrous fumarate-iron polysaccharide complex (TANDEM) 162-115.2 MG CAPS capsule Take 1 capsule by mouth daily with breakfast.   Yes Historical Provider, MD  Tetrahydrozoline-PEG (EYE DROPS EXTRA OP) Apply to eye.   Yes Historical Provider, MD  acetaminophen (TYLENOL) 500 MG tablet Take 1,000 mg by mouth every 6 (six) hours as needed for headache.    Historical Provider, MD  aspirin-acetaminophen-caffeine (EXCEDRIN MIGRAINE) [phone] MG per tablet Take 1 tablet by mouth every 6 (six) hours as needed for headache.    Historical Provider, MD  Aspirin-Acetaminophen-Caffeine (GOODY HEADACHE PO) Take 1 Package by mouth as needed (for pain).    Historical Provider, MD  oxyCODONE-acetaminophen (ROXICET) 5-325 MG per tablet Take 1 tablet by mouth  every 4 (four) hours as needed for severe pain. 05/01/14   Charlotte Crumb, MD  SUMAtriptan (IMITREX) 50 MG tablet May repeat in 2 hours if headache persists or recurs. 07/28/13   Donika K Patel, DO   BP 147/89 mmHg  Pulse 66  Temp(Src) 98 F (36.7 C) (Oral)  Resp 20  Ht 5\' 1"  (1.549 m)  Wt 289 lb (131.09 kg)  BMI 54.63 kg/m2  SpO2 100%   Physical Exam General: Well-developed, well-nourished female in no acute distress; appearance consistent with age of record HENT: normocephalic; atraumatic Eyes: irregular, non-reactive right pupil; left pupil  is round and reactive to light; extraocular muscles intact Neck: supple Heart: regular rate and rhythm Lungs: clear to auscultation bilaterally Abdomen: soft; nondistended Extremities: No deformity; full range of motion except at hips due to pain; pulses normal Neurologic: Awake, alert and oriented; motor function intact in all extremities and symmetric; no facial droop Back: mild, right-sided upper back TTP; positive straight leg raise (pain in mid right upper back) on the left at 45 degrees; positive straight leg raise (pain in mid right upper back) on the right at about 10 degrees Skin: Warm and dry Psychiatric: Normal mood and affect   ED Course  Procedures  MDM  Although the patient characterizes the pain as feeling like "muscle spasms", there was no palpable muscle spasm and minimal tenderness on exam. The pain is clearly exacerbated by movement of her hips or back. The associated paresthesias and heaviness of the right leg suggest a radiculopathy. She has an established neurologist who treats her for her pseudotumor cerebrally. We will refer her to her neurologist for further evaluation and treatment.  Final diagnoses:  Mid back pain on right side   I personally performed the services described in this documentation, which was scribed in my presence. The recorded information has been reviewed and is accurate.   Shanon Rosser, MD 02/28/15 [phone]

## 2015-02-28 NOTE — ED Notes (Signed)
No adverse effects noted to IM injection.  

## 2015-03-01 ENCOUNTER — Telehealth: Payer: Self-pay | Admitting: Family

## 2015-03-01 DIAGNOSIS — M5440 Lumbago with sciatica, unspecified side: Secondary | ICD-10-CM

## 2015-03-01 MED ORDER — ETODOLAC 300 MG PO CAPS: 300.0000 mg | ORAL_CAPSULE | Freq: Two times a day (BID) | ORAL | Status: DC

## 2015-03-01 MED ORDER — CYCLOBENZAPRINE HCL 10 MG PO TABS
10.0000 mg | ORAL_TABLET | Freq: Three times a day (TID) | ORAL | Status: DC | PRN
Start: 2015-03-01 — End: 2017-04-13

## 2015-03-01 NOTE — Progress Notes (Signed)
We are sorry that you are not feeling well.  Here is how we plan to help!  Based on what you have shared with me it looks like you mostly have acute back pain.  Acute back pain is defined as musculoskeletal pain that can resolve in 1-3 weeks with conservative treatment.  I have prescribed Etodolac 300 mg twice a day non-steroid anti-inflammatory (NSAID) as well as Flexeril 10 mg every eight hours as needed which is a muscle relaxer  Some patients experience stomach irritation or in increased heartburn with anti-inflammatory drugs.  Please keep in mind that muscle relaxer's can cause fatigue and should not be taken while at work or driving.  Back pain is very common.  The pain often gets better over time.  The cause of back pain is usually not dangerous.  Most people can learn to manage their back pain on their own.  *WARNING: The Oxycodone given to you by the ED should be taken 3 hours apart from the Flexeril to avoid excessive sedation. Also, you should see your neurologist face-to-face ASAP or ask the office where he works to get you another neurologist ASAP to see you urgently. This E-visit will only provide you with temporary relief for what sounds like a more serious underlying problem. While probably not life-threatening, you should make a face-to-face appointment an urgent priority this week.   Home Care  Stay active.  Start with short walks on flat ground if you can.  Try to walk farther each day.  Do not sit, drive or stand in one place for more than 30 minutes.  Do not stay in bed.  Do not avoid exercise or work.  Activity can help your back heal faster.  Be careful when you bend or lift an object.  Bend at your knees, keep the object close to you, and do not twist.  Sleep on a firm mattress.  Lie on your side, and bend your knees.  If you lie on your back, put a pillow under your knees.  Only take medicines as told by your doctor.  Put ice on the injured area.  Put ice in a  plastic bag  Place a towel between your skin and the bag  Leave the ice on for 15-20 minutes, 3-4 times a day for the first 2-3 days. 210 After that, you can switch between ice and heat packs.  Ask your doctor about back exercises or massage.  Avoid feeling anxious or stressed.  Find good ways to deal with stress, such as exercise.  Get Help Right Way If:  Your pain does not go away with rest or medicine.  Your pain does not go away in 1 week.  You have new problems.  You do not feel well.  The pain spreads into your legs.  You cannot control when you poop (bowel movement) or pee (urinate)  You feel sick to your stomach (nauseous) or throw up (vomit)  You have belly (abdominal) pain.  You feel like you may pass out (faint).  If you develop a fever.  Make Sure you:  Understand these instructions.  Will watch your condition  Will get help right away if you are not doing well or get worse.  Your e-visit answers were reviewed by a board certified advanced clinical practitioner to complete your personal care plan.  Depending on the condition, your plan could have included both over the counter or prescription medications.  If there is a problem please reply  once  you have received a response from your provider.  Your safety is important to [place].  If you have drug allergies check your prescription carefully.    You can use MyChart to ask questions about today's visit, request a non-urgent call back, or ask for a work or school excuse for 24 hours related to this e-Visit. If it has been greater than 24 hours you will need to follow up with your provider, or enter a new e-Visit to address those concerns.  You will get an e-mail in the next two days asking about your experience.  I hope that your e-visit has been valuable and will speed your recovery. Thank you for using e-visits.

## 2015-03-10 ENCOUNTER — Telehealth: Payer: Self-pay | Admitting: Neurology

## 2015-03-10 NOTE — Telephone Encounter (Signed)
Pls let patient know that ER notes reviewed indicate main issue is back pain.  Dr. Posey Pronto had advised her back in March 2016 to schedule appt to see her for headaches and to restart Diamox. She has not scheduled f/u. Would call PCP about back pain and f/u headaches with Dr. Posey Pronto as she had advised last March. Thanks

## 2015-03-10 NOTE — Telephone Encounter (Signed)
Pt wants to talk to a  Nurse about she went to the ED pain in the back and had headache  She wants to come in and see someone since Dr Posey Pronto is out of the office [phone]

## 2015-03-10 NOTE — Telephone Encounter (Signed)
Has not seen Dr Posey Pronto since 07/2013. Please review ED notes and advise if she needs to see someone here urgently.

## 2015-03-11 NOTE — Telephone Encounter (Signed)
Left message giving patient information and instructions. 

## 2015-03-11 NOTE — Telephone Encounter (Signed)
Denise Cook- can you call and relay this message to Dr Posey Pronto patient? Thanks.

## 2015-05-14 DIAGNOSIS — H4041X1 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, right eye, mild stage: Secondary | ICD-10-CM | POA: Insufficient documentation

## 2015-07-06 ENCOUNTER — Ambulatory Visit (INDEPENDENT_AMBULATORY_CARE_PROVIDER_SITE_OTHER): Payer: BC Managed Care – PPO | Admitting: Family Medicine

## 2015-07-06 VITALS — BP 128/80 | HR 68 | Temp 98.1°F | Resp 18 | Ht 62.0 in | Wt 288.4 lb

## 2015-07-06 DIAGNOSIS — R110 Nausea: Secondary | ICD-10-CM

## 2015-07-06 MED ORDER — ONDANSETRON 4 MG PO TBDP: 4.0000 mg | ORAL_TABLET | Freq: Three times a day (TID) | ORAL | Status: DC | PRN

## 2015-07-06 NOTE — Patient Instructions (Signed)
     IF you received an x-ray today, you will receive an invoice from [facility]. Please contact [facility] at [phone] with questions or concerns regarding your invoice.   IF you received labwork today, you will receive an invoice from [facility]. Please contact [facility] at [phone] with questions or concerns regarding your invoice.   Our billing staff will not be able to assist you with questions regarding bills from these companies.  You will be contacted with the lab results as soon as they are available. The fastest way to get your results is to activate your My Chart account. Instructions are located on the last page of this paperwork. If you have not heard from us regarding the results in 2 weeks, please contact this office.      

## 2015-07-06 NOTE — Progress Notes (Signed)
  Subjective:     Denise Cook is a 40 y.o. female who presents for evaluation of nausea, vomiting, back pain.  It has been present for 1 day.  Associated signs & symptoms:  ability to keep down some fluids.  Nausea since 6 PM last night and vomiting x 5 (clear, non bilious/bloody).  Diarrhea x 1 episode that was melenotic or hematochezia.  No recent medications.  Denies dysuria or hematuria or increased frequency.  Denies fever/chills/sweats.  Low back pain on going since last night as well with twisting.  No radiation down her leg.  Pt denies any current bowel/bladder problems, fever, chills, unintentional weight loss, night time awakenings secondary to pain, weakness in one or both legs.  Has tried muscle relaxer with minimal relief.    The following portions of the patient's history were reviewed and updated as appropriate: allergies, current medications, past family history, past medical history, past social history, past surgical history and problem list. Review of Systems Pertinent items are noted in HPI.       Objective:    BP 128/80 mmHg  Pulse 68  Temp(Src) 98.1 F (36.7 C) (Oral)  Resp 18  Ht 5\' 2"  (1.575 m)  Wt 288 lb 6.4 oz (130.817 kg)  BMI 52.74 kg/m2  SpO2 96% General appearance: alert, cooperative and appears stated age Throat: lips, mucosa, and tongue normal; teeth and gums normal MMM Back: symmetric, no curvature. ROM normal. No CVA tenderness. Heart: regular rate and rhythm   Abd: Soft/NT/ND, NABS, no periumbilical TTP and no CVA TTP. No HSM   Assessment:     1) Nausea/Vomiting 2) Lumbar back pain c/w strain  Plan:  MMM and no red flags.  Vitals stable.  No CVA TTP.  No urinary Sx.  Do not feel that blood work would add much.  Most likely viral gastroenteritis.  Will Rx Zofran 4 mg ODT and recommend continued observation.  If no improvement, consider UA, U preg, BMET, CBC to look for other causes. She does have a back strain as well in which I would continue  muscle relaxers and ibuprofen.

## 2015-09-14 ENCOUNTER — Other Ambulatory Visit: Payer: Self-pay | Admitting: Family Medicine

## 2015-09-14 MED ORDER — ONDANSETRON 4 MG PO TBDP: 4.0000 mg | ORAL_TABLET | Freq: Three times a day (TID) | ORAL | Status: DC | PRN

## 2015-09-24 ENCOUNTER — Encounter (HOSPITAL_BASED_OUTPATIENT_CLINIC_OR_DEPARTMENT_OTHER): Payer: Self-pay | Admitting: *Deleted

## 2015-09-24 ENCOUNTER — Emergency Department (HOSPITAL_BASED_OUTPATIENT_CLINIC_OR_DEPARTMENT_OTHER)
Admission: EM | Admit: 2015-09-24 | Discharge: 2015-09-24 | Disposition: A | Discharge status: Home / Self Care | Payer: BC Managed Care – PPO | Attending: Emergency Medicine | Admitting: Emergency Medicine

## 2015-09-24 ENCOUNTER — Emergency Department (HOSPITAL_BASED_OUTPATIENT_CLINIC_OR_DEPARTMENT_OTHER): Payer: BC Managed Care – PPO

## 2015-09-24 DIAGNOSIS — F329 Major depressive disorder, single episode, unspecified: Secondary | ICD-10-CM | POA: Diagnosis not present

## 2015-09-24 DIAGNOSIS — Z79899 Other long term (current) drug therapy: Secondary | ICD-10-CM | POA: Insufficient documentation

## 2015-09-24 DIAGNOSIS — Z7982 Long term (current) use of aspirin: Secondary | ICD-10-CM | POA: Diagnosis not present

## 2015-09-24 DIAGNOSIS — Z6841 Body Mass Index (BMI) 40.0 and over, adult: Secondary | ICD-10-CM | POA: Diagnosis not present

## 2015-09-24 DIAGNOSIS — Z7951 Long term (current) use of inhaled steroids: Secondary | ICD-10-CM | POA: Insufficient documentation

## 2015-09-24 DIAGNOSIS — E669 Obesity, unspecified: Secondary | ICD-10-CM | POA: Insufficient documentation

## 2015-09-24 DIAGNOSIS — Z792 Long term (current) use of antibiotics: Secondary | ICD-10-CM | POA: Insufficient documentation

## 2015-09-24 DIAGNOSIS — J45901 Unspecified asthma with (acute) exacerbation: Secondary | ICD-10-CM | POA: Insufficient documentation

## 2015-09-24 DIAGNOSIS — E119 Type 2 diabetes mellitus without complications: Secondary | ICD-10-CM | POA: Insufficient documentation

## 2015-09-24 DIAGNOSIS — R0602 Shortness of breath: Secondary | ICD-10-CM | POA: Diagnosis present

## 2015-09-24 LAB — CBC WITH DIFFERENTIAL/PLATELET
BASOS ABS: 0 10*3/uL (ref 0.0–0.1)
Basophils Relative: 0 %
EOS ABS: 0 10*3/uL (ref 0.0–0.7)
Eosinophils Relative: 0 %
HCT: 38.2 % (ref 36.0–46.0)
HEMOGLOBIN: 12.6 g/dL (ref 12.0–15.0)
LYMPHS PCT: 19 %
Lymphs Abs: 2.6 10*3/uL (ref 0.7–4.0)
MCH: 28.9 pg (ref 26.0–34.0)
MCHC: 33 g/dL (ref 30.0–36.0)
MCV: 87.6 fL (ref 78.0–100.0)
Monocytes Absolute: 0.6 10*3/uL (ref 0.1–1.0)
Monocytes Relative: 4 %
NEUTROS ABS: 10.7 10*3/uL — AB (ref 1.7–7.7)
NEUTROS PCT: 77 %
Platelets: 441 10*3/uL — ABNORMAL HIGH (ref 150–400)
RBC: 4.36 MIL/uL (ref 3.87–5.11)
RDW: 14.8 % (ref 11.5–15.5)
WBC: 13.9 10*3/uL — AB (ref 4.0–10.5)

## 2015-09-24 LAB — BASIC METABOLIC PANEL
Anion gap: 11 (ref 5–15)
BUN: 13 mg/dL (ref 6–20)
CALCIUM: 9 mg/dL (ref 8.9–10.3)
CHLORIDE: 105 mmol/L (ref 101–111)
CO2: 22 mmol/L (ref 22–32)
CREATININE: 0.87 mg/dL (ref 0.44–1.00)
GFR calc Af Amer: >60 mL/min (ref >60)
GFR calc non Af Amer: >60 mL/min (ref >60)
GLUCOSE: 153 mg/dL — AB (ref 65–99)
Potassium: 3.7 mmol/L (ref 3.5–5.1)
Sodium: 138 mmol/L (ref 135–145)

## 2015-09-24 MED ORDER — IOPAMIDOL (ISOVUE-370) INJECTION 76%
100.0000 mL | Freq: Once | INTRAVENOUS | Status: AC | PRN
  Administered 2015-09-24: 100 mL via INTRAVENOUS

## 2015-09-24 MED ORDER — PREDNISONE 20 MG PO TABS: ORAL_TABLET | ORAL | Status: DC

## 2015-09-24 MED ORDER — IPRATROPIUM-ALBUTEROL 0.5-2.5 (3) MG/3ML IN SOLN
3.0000 mL | Freq: Once | RESPIRATORY_TRACT | Status: AC
  Administered 2015-09-24: 3 mL via RESPIRATORY_TRACT

## 2015-09-24 MED ORDER — IPRATROPIUM-ALBUTEROL 0.5-2.5 (3) MG/3ML IN SOLN
3.0000 mL | RESPIRATORY_TRACT | Status: AC
Start: 2015-09-24 — End: 2015-09-24
  Administered 2015-09-24 (×2): 3 mL via RESPIRATORY_TRACT
  Filled 2015-09-24 (×3): qty 3

## 2015-09-24 MED ORDER — PREDNISONE 20 MG PO TABS
40.0000 mg | ORAL_TABLET | Freq: Once | ORAL | Status: AC
  Administered 2015-09-24: 40 mg via ORAL
  Filled 2015-09-24: qty 2

## 2015-09-24 MED ORDER — ALBUTEROL SULFATE (2.5 MG/3ML) 0.083% IN NEBU
2.5000 mg | INHALATION_SOLUTION | Freq: Once | RESPIRATORY_TRACT | Status: AC
  Administered 2015-09-24: 2.5 mg via RESPIRATORY_TRACT

## 2015-09-24 MED ORDER — ALBUTEROL SULFATE (2.5 MG/3ML) 0.083% IN NEBU
INHALATION_SOLUTION | RESPIRATORY_TRACT | Status: AC
  Administered 2015-09-24: 2.5 mg via RESPIRATORY_TRACT
  Filled 2015-09-24: qty 3

## 2015-09-24 MED ORDER — IPRATROPIUM-ALBUTEROL 0.5-2.5 (3) MG/3ML IN SOLN
RESPIRATORY_TRACT | Status: AC
  Administered 2015-09-24: 3 mL via RESPIRATORY_TRACT
  Filled 2015-09-24: qty 3

## 2015-09-24 NOTE — ED Notes (Signed)
Pt c/o SOB x 1 week , seen by PMD x 5 days ago for same DX Bronchitis  Received rocephin z pkecadron, pro air,  prednisone

## 2015-09-24 NOTE — ED Provider Notes (Signed)
CSN: [id number]     Arrival date & time 09/24/15  1859 History  By signing my name below, I, Roxine Caddy, attest that this documentation has been prepared under the direction and in the presence of Deno Etienne, DO.  Electronically signed: Roxine Caddy, ED Scribe. 09/24/2015. 8:09 PM.   Chief Complaint  Patient presents with  . Shortness of Breath   The history is provided by the patient. No language interpreter was used.   HPI Comments: Denise Cook is a 40 y.o. female with PMHx of asthma, stage 2 kidney disease, sarcoidosis, DM, and pseudotumor cerebri who presents to the Emergency Department complaining of sudden onset, gradual worsening SOB with onset 7 days ago. Pt reports associated chest pain and feeling hot. She describes the pain as something sitting heavy on her chest. She notes her sleep was interrupted due to her breathing. Pt states she went to walk-in clinic 2 days ago where they took chest x-rays, prescribed albuterol, 20 mg of prednisone, and steroid injections. Pt states she had mild relief of symptoms. Pt notes symptoms feel worse than her usually asthma. She denies fever, leg swelling, long trips or surgery, taking estrogen, h/o blood clots or cancer. She also states she has had a complete hysterectomy.  Past Medical History  Diagnosis Date  . Asthma   . Sarcoidosis ([place]) 2012  . Obesity   . Pseudotumor cerebri   . Diabetes mellitus     diet control  . Allergy   . Depression   . Glaucoma    Past Surgical History  Procedure Laterality Date  . Abdominal hysterectomy    . Cholecystectomy    . Left eye surgery    . Eye surgery Left     Removed vitreous membrane  . Cardiac catheterization    . Incision and drainage Right 05/01/2014    Procedure: INCISION AND DRAINAGE RIGHT INDEX FLEXOR SHEATH  ;  Surgeon: Charlotte Crumb, MD;  Location: [facility];  Service: Orthopedics;  Laterality: Right;  . Glaucoma shunt    . Coronary artery bypass graft      Family History  Problem Relation Age of Onset  . Adopted: Yes  . Healthy Daughter   . Cancer Maternal Grandmother   . Alcoholism Mother   . Diabetes Paternal Grandmother    Social History  Substance Use Topics  . Smoking status: Never Smoker   . Smokeless tobacco: Never Used  . Alcohol Use: No   OB History    No data available     Review of Systems  Constitutional: Negative for fever and chills.  HENT: Negative for congestion and rhinorrhea.   Eyes: Negative for redness and visual disturbance.  Respiratory: Positive for shortness of breath. Negative for wheezing.   Cardiovascular: Positive for chest pain. Negative for palpitations and leg swelling.  Gastrointestinal: Negative for nausea and vomiting.  Genitourinary: Negative for dysuria and urgency.  Musculoskeletal: Negative for myalgias and arthralgias.  Skin: Negative for pallor and wound.  Neurological: Negative for dizziness and headaches.  All other systems reviewed and are negative.     Allergies  Metformin and related and Strawberry extract  Home Medications   Prior to Admission medications   Medication Sig Start Date End Date Taking? Authorizing Provider  albuterol (PROVENTIL) (2.5 MG/3ML) 0.083% nebulizer solution Take 2.5 mg by nebulization every 6 (six) hours as needed for wheezing or shortness of breath.   Yes Historical Provider, MD  azithromycin (ZITHROMAX) 250 MG tablet Take by mouth daily.  Yes Historical Provider, MD  predniSONE (DELTASONE) 10 MG tablet Take 10 mg by mouth daily with breakfast.   Yes Historical Provider, MD  acetaminophen (TYLENOL) 500 MG tablet Take 1,000 mg by mouth every 6 (six) hours as needed for headache. Reported on 07/06/2015    Historical Provider, MD  Albiglutide (TANZEUM) 50 MG PEN Inject into the skin. Reported on 07/06/2015    Historical Provider, MD  aspirin-acetaminophen-caffeine (EXCEDRIN MIGRAINE) [phone] MG per tablet Take 1 tablet by mouth every 6 (six) hours as  needed for headache. Reported on 07/06/2015    Historical Provider, MD  Aspirin-Acetaminophen-Caffeine (GOODY HEADACHE PO) Take 1 Package by mouth as needed (for pain). Reported on 07/06/2015    Historical Provider, MD  azaTHIOprine (IMURAN) 50 MG tablet Take 50 mg by mouth daily.    Historical Provider, MD  cyclobenzaprine (FLEXERIL) 10 MG tablet Take 1 tablet (10 mg total) by mouth every 8 (eight) hours as needed for muscle spasms. 03/01/15   Benjamine Mola, FNP  etodolac (LODINE) 300 MG capsule Take 1 capsule (300 mg total) by mouth 2 (two) times daily. Patient not taking: Reported on 07/06/2015 03/01/15   Benjamine Mola, FNP  ferrous fumarate-iron polysaccharide complex (TANDEM) 162-115.2 MG CAPS capsule Take 1 capsule by mouth daily with breakfast. Reported on 07/06/2015    Historical Provider, MD  ondansetron (ZOFRAN ODT) 4 MG disintegrating tablet Take 1 tablet (4 mg total) by mouth every 8 (eight) hours as needed for nausea or vomiting. 09/14/15   Nolon Rod, DO  oxyCODONE-acetaminophen (ROXICET) 5-325 MG tablet Take 1-2 tablets by mouth every 6 (six) hours as needed for severe pain (for pain). Patient not taking: Reported on 07/06/2015 02/28/15   Shanon Rosser, MD  predniSONE (DELTASONE) 20 MG tablet 2 tabs po daily x 4 days 09/24/15   Deno Etienne, DO  SUMAtriptan (IMITREX) 50 MG tablet May repeat in 2 hours if headache persists or recurs. Patient not taking: Reported on 07/06/2015 07/28/13   Alda Berthold, DO  Tetrahydrozoline-PEG (EYE DROPS EXTRA OP) Apply to eye. Reported on 07/06/2015    Historical Provider, MD   BP 123/86 mmHg  Pulse 91  Temp(Src) 98.5 F (36.9 C) (Oral)  Resp 24  Ht 5' 1.5" (1.562 m)  Wt 290 lb (131.543 kg)  BMI 53.91 kg/m2  SpO2 98% Physical Exam  Constitutional: She is oriented to person, place, and time. She appears well-developed and well-nourished. No distress.  HENT:  Head: Normocephalic and atraumatic.  Eyes: EOM are normal. Pupils are equal, round, and reactive to  light.  Neck: Normal range of motion. Neck supple.  Cardiovascular: Normal rate and regular rhythm.  Exam reveals no gallop and no friction rub.   No murmur heard. 4/6 systolic murmur best heard at the aortic ejection site.  Pulmonary/Chest: Effort normal. She has no wheezes. She has no rales.  Diminished breath sounds in all lung fields. Mild prolonged expiration. Mild tachypnea.   Abdominal: Soft. She exhibits no distension. There is no tenderness.  Musculoskeletal: She exhibits no edema or tenderness.  Neurological: She is alert and oriented to person, place, and time.  Skin: Skin is warm and dry. She is not diaphoretic.  Psychiatric: She has a normal mood and affect. Her behavior is normal.  Nursing note and vitals reviewed.   ED Course  Procedures  DIAGNOSTIC STUDIES: Oxygen Saturation is 100% on RA, normal by my interpretation.  COORDINATION OF CARE: 7:54 PM Discussed treatment plan which includes administering a breathing  treatment, prescribing 40 mg of prednisone, as well as a CT chest  with pt at bedside and pt agreed to plan.  Labs Review Labs Reviewed  CBC WITH DIFFERENTIAL/PLATELET - Abnormal; Notable for the following:    WBC 13.9 (*)    Platelets 441 (*)    Neutro Abs 10.7 (*)    All other components within normal limits  BASIC METABOLIC PANEL - Abnormal; Notable for the following:    Glucose, Bld 153 (*)    All other components within normal limits    Imaging Review Dg Chest 2 View  09/24/2015  CLINICAL DATA:  Shortness of breath, chest pressure for 1 week, cough starting Monday EXAM: CHEST  2 VIEW COMPARISON:  09/20/2015 FINDINGS: Cardiomediastinal silhouette is stable. No infiltrate or pleural effusion. No pulmonary edema. Mild degenerative changes mid thoracic spine. IMPRESSION: No active cardiopulmonary disease. Electronically Signed   By: Lahoma Crocker M.D.   On: 09/24/2015 19:56   Ct Angio Chest Pe W Or Wo Contrast  09/24/2015  CLINICAL DATA:  40 year old  female with shortness of breath EXAM: CT ANGIOGRAPHY CHEST WITH CONTRAST TECHNIQUE: Multidetector CT imaging of the chest was performed using the standard protocol during bolus administration of intravenous contrast. Multiplanar CT image reconstructions and MIPs were obtained to evaluate the vascular anatomy. CONTRAST:  100 cc Isovue 370 COMPARISON:  Chest radiograph dated 09/24/2015 FINDINGS: Mild diffuse ground-glass and hazy airspace density throughout the lungs most compatible with atelectatic changes. There is no focal consolidation, pleural effusion, or pneumothorax. The central airways are patent. The thoracic aorta appears unremarkable. The origins of the great vessels of the aortic arch appear patent. No CT evidence of pulmonary embolism. There is no cardiomegaly or pericardial effusion. There is no hilar or mediastinal adenopathy. Esophagus is grossly unremarkable. No thyroid nodules identified. There is no axillary adenopathy. The chest wall soft tissues appear unremarkable. The osseous structures are intact. Diffuse fatty infiltration of the liver. The visualized upper abdomen is otherwise unremarkable. Review of the MIP images confirms the above findings. IMPRESSION: No acute intrathoracic pathology. No CT evidence of pulmonary embolism. Electronically Signed   By: Anner Crete M.D.   On: 09/24/2015 21:23   I have personally reviewed and evaluated these images and lab results as part of my medical decision-making.   EKG Interpretation None      MDM   Final diagnoses:  Asthma exacerbation    40 yo F With a chief complaint of shortness of breath. This been going on for about a week. Was seen in a walk-in clinic and given antibiotics and steroids. Symptoms have persisted. She'll feels like she's having trouble breathing again. Diminished breath sounds on my initial exam. Patient was given 3 DuoNeb's back-to-back with significant improvement. PE scan was negative. Feeling better.   I  personally performed the services described in this documentation, which was scribed in my presence. The recorded information has been reviewed and is accurate.  11:00 PM:  I have discussed the diagnosis/risks/treatment options with the patient and family and believe the pt to be eligible for discharge home to follow-up with PCP. We also discussed returning to the ED immediately if new or worsening sx occur. We discussed the sx which are most concerning (e.g., sudden worsening pain, fever, inability to tolerate by mouth) that necessitate immediate return. Medications administered to the patient during their visit and any new prescriptions provided to the patient are listed below.  Medications given during this visit Medications  albuterol (PROVENTIL) (2.5  MG/3ML) 0.083% nebulizer solution 2.5 mg (2.5 mg Nebulization Given 09/24/15 1918)  ipratropium-albuterol (DUONEB) 0.5-2.5 (3) MG/3ML nebulizer solution 3 mL (3 mLs Nebulization Given 09/24/15 1918)  ipratropium-albuterol (DUONEB) 0.5-2.5 (3) MG/3ML nebulizer solution 3 mL (3 mLs Nebulization Given 09/24/15 2019)  predniSONE (DELTASONE) tablet 40 mg (40 mg Oral Given 09/24/15 2013)  iopamidol (ISOVUE-370) 76 % injection 100 mL (100 mLs Intravenous Contrast Given 09/24/15 2055)    New Prescriptions   PREDNISONE (DELTASONE) 20 MG TABLET    2 tabs po daily x 4 days    The patient appears reasonably screen and/or stabilized for discharge and I doubt any other medical condition or other [facility] requiring further screening, evaluation, or treatment in the ED at this time prior to discharge.     Deno Etienne, DO 09/24/15 2300

## 2015-09-24 NOTE — ED Notes (Signed)
Patient transported to X-ray 

## 2015-09-24 NOTE — Discharge Instructions (Signed)

## 2015-11-24 ENCOUNTER — Other Ambulatory Visit: Payer: Self-pay | Admitting: Family Medicine

## 2016-01-06 ENCOUNTER — Other Ambulatory Visit: Payer: Self-pay | Admitting: Neurology

## 2016-01-06 NOTE — Telephone Encounter (Signed)
Refused RX request: Patient was last seen 07/18/13

## 2016-02-04 ENCOUNTER — Other Ambulatory Visit: Payer: Self-pay | Admitting: Family Medicine

## 2016-02-04 DIAGNOSIS — R200 Anesthesia of skin: Secondary | ICD-10-CM

## 2016-02-11 ENCOUNTER — Encounter: Payer: Self-pay | Admitting: Neurology

## 2016-02-11 ENCOUNTER — Other Ambulatory Visit: Payer: Self-pay | Admitting: Neurology

## 2016-02-11 ENCOUNTER — Ambulatory Visit (INDEPENDENT_AMBULATORY_CARE_PROVIDER_SITE_OTHER): Payer: BC Managed Care – PPO | Admitting: Neurology

## 2016-02-11 VITALS — BP 110/68 | HR 92 | Ht 62.0 in | Wt 252.4 lb

## 2016-02-11 DIAGNOSIS — G932 Benign intracranial hypertension: Secondary | ICD-10-CM

## 2016-02-11 DIAGNOSIS — R200 Anesthesia of skin: Secondary | ICD-10-CM | POA: Diagnosis not present

## 2016-02-11 DIAGNOSIS — D869 Sarcoidosis, unspecified: Secondary | ICD-10-CM | POA: Diagnosis not present

## 2016-02-11 DIAGNOSIS — R209 Unspecified disturbances of skin sensation: Secondary | ICD-10-CM | POA: Diagnosis not present

## 2016-02-11 DIAGNOSIS — R202 Paresthesia of skin: Secondary | ICD-10-CM

## 2016-02-11 MED ORDER — NORTRIPTYLINE HCL 10 MG PO CAPS: ORAL_CAPSULE | ORAL | 5 refills | Status: DC

## 2016-02-11 NOTE — Patient Instructions (Addendum)
1.  Start nortriptyline 10mg  at bedtime for 2 week, then increase to 2 tablet at bedtime 2.  MRI brain wwo contrast 3.  Limit tylenol, ibuprofen, and Aleve to twice per week  Return to clinic 4 months

## 2016-02-11 NOTE — Progress Notes (Signed)
Follow-up Visit   Date: 02/11/16    Denise Cook MRN: [id number] DOB: Oct 13, 1975   Interim History: Denise Cook is a 40 y.o. 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  She was last seen in the clinic in April 2015 and then lost to follow-up.  History of present illness: In 2013, she developed right leg pain after falling down her steps with associated swelling of the lower leg, but sparing the foot. Pain is described as achy and involves only the anterior aspect of the leg along the bone, about 2 inches about the ankle and 4inches below the knee. It is worse with activity, prolonged standing or walking, and tender to touch. She denies any numbness, tingling, or weakness. She has tried a TENS unit without benefit. Nothing seems to alleviate her pain. Previous work-up has included orthopeadic evaluation who performed MRI of the leg without contrast and showed mild subcutaneous edema. She was managed as possible tibial stress syndrome with therapy, applying ice/heat, elevating legs, and stockings without improvement.   She also complains of daily headache for the past two months. In November, she was interviewing for a new job and reports being very stressed. She developed a headache at the base of her head, described as throbbing ache which has not improved since onset. She has nausea, photophobia, and phonophobia. Headache is worse with increased stress and it is improved with rest and relaxation. Denies morning headaches or of symptoms with changes in position. She has tried toradol injection, tramadol, hydrocodone, and exedrin migraine without any relief.   Of note, the dose of imuran was increased to  200mg .   - Follow-up 05/16/2013:  She had a GON block on 1/19 for headaches without significant benefits.  Subsequently MRI of the brain was done which was concerning for pseudotumor cerebri which was confirmed by LP which showed an OP of 70mmHg.  She did not have any change in headaches following LP.  EMG of the right leg was normal.  She saw her ophthalmologist (Dr. Nehemiah Massed) who did not find any evidence of increased IOP.    - Follow-up 07/18/2013:  She had worsening headaches, despite increasing diamox so underwent second LP which showed OP 37.  Headaches resolved after LP, but quickly returned within 2-days.  Headaches were even worse than before, so diamox was increased to 1000 BID.  She continued to headaches so lasix was added and was referred to neurosurgery who did not recommend shunt due to no papilledema and vision symptoms.  About 10-days ago (soon after started lasix), headaches slowly improved to know 4/10 most days, when severe 10/10 (occuring 3-times per week).  Her potassium was checked and found to be low 2.9 and she has subsequently been started on potassium 5mEq twice daily.  - UPDATE 02/11/2016:  Patient scheduled return visit for numbness of the legs.  She was last seen in April 2015 for headaches related to psuedotumor cerebri and was taking Diamox 1000mg  BID and lasix 20mg  daily.  She discontinued these medications in August 2016 during which time she had aqueous shunt placed in her right eye in August 2016 due to glaucoma.  Her headaches did not significantly worsen despite stopping her medications.  She continues to have daily dull headaches, but does  not usually take anything for it.  No new vision complaints. She is followed closely by opthalmology for ocular sarcoidosis.  Starting around September 2017, she began having spells of right leg numbnes over the entire thigh, but sparing the lower leg.  She reports that she is falling more because her right leg feels numb with sitting  or prolonged standing. She usually leans against something and then shakes her leg, which usually resolves symptoms.  It can last anywhere from 47min - 30 minutes and occurs daily.  She also feels numbness of the right side of her jaw and arm which is sporadic occurring about 4 time in the past two weeks, this lasts a few minutes.  No associated weakness.  Medications:  Current Outpatient Prescriptions on File Prior to Visit  Medication Sig Dispense Refill  . acetaminophen (TYLENOL) 500 MG tablet Take 1,000 mg by mouth every 6 (six) hours as needed for headache. Reported on 07/06/2015    . Albiglutide (TANZEUM) 50 MG PEN Inject into the skin. Reported on 07/06/2015    . albuterol (PROVENTIL) (2.5 MG/3ML) 0.083% nebulizer solution Take 2.5 mg by nebulization every 6 (six) hours as needed for wheezing or shortness of breath.    Marland Kitchen aspirin-acetaminophen-caffeine (EXCEDRIN MIGRAINE) 250-250-65 MG per tablet Take 1 tablet by mouth every 6 (six) hours as needed for headache. Reported on 07/06/2015    . Aspirin-Acetaminophen-Caffeine (GOODY HEADACHE PO) Take 1 Package by mouth as needed (for pain). Reported on 07/06/2015    . azaTHIOprine (IMURAN) 50 MG tablet Take 50 mg by mouth daily.    . cyclobenzaprine (FLEXERIL) 10 MG tablet Take 1 tablet (10 mg total) by mouth every 8 (eight) hours as needed for muscle spasms. 21 tablet 0  . ondansetron (ZOFRAN ODT) 4 MG disintegrating tablet Take 1 tablet (4 mg total) by mouth every 8 (eight) hours as needed for nausea or vomiting. 20 tablet 0  . SUMAtriptan (IMITREX) 50 MG tablet May repeat in 2 hours if headache persists or recurs. 10 tablet 0  . Tetrahydrozoline-PEG (EYE DROPS EXTRA OP) Apply to eye. Reported on 07/06/2015     No current facility-administered medications on file prior to visit.     Allergies:  Allergies  Allergen Reactions  . Metformin And Related Itching  . Strawberry Extract Itching and Swelling     Review of Systems:  CONSTITUTIONAL: No  fevers, chills, night sweats, or weight loss.  +headaches EYES: No visual changes or eye pain ENT: No hearing changes.  No history of nose bleeds.   RESPIRATORY: No cough, wheezing and shortness of breath.   CARDIOVASCULAR: Negative for chest pain, and palpitations.   GI: Negative for abdominal discomfort, blood in stools or black stools.  No recent change in bowel habits.   GU:  No history of incontinence.   MUSCLOSKELETAL: No history of joint pain or swelling.  No myalgias.   SKIN: Negative for lesions, rash, and itching.   ENDOCRINE: Negative for cold or heat intolerance, polydipsia or goiter.   PSYCH:  No depression or anxiety symptoms.   NEURO: As Above.   Vital Signs:  BP 110/68   Pulse 92   Ht 5\' 2"  (1.575 m)   Wt 252 lb 7 oz (114.5 kg)   SpO2 96%   BMI 46.17 kg/m   Neurological Exam: MENTAL STATUS including orientation to time, place, person, recent and remote memory, attention span and concentration, language, and fund of knowledge is normal.  Speech is not dysarthric.  CRANIAL NERVES:  No visual field defects. Fundoscopic examination does not show any papilledema, disc margins.  Pupils equal round and reactive to light.  Normal conjugate, extra-ocular eye movements in all directions of gaze. Normal facial sensation.  Face is symmetric. Palate elevates symmetrically.  Tongue is midline.  MOTOR:  Motor strength is 5/5 in all extremities.   MSRs:  Reflexes are 2+/4 throughout  SENSORY:  Intact to light touch.  COORDINATION/GAIT:  Finger to nose testing is normal.  No dysmetria.  Gait is slightly wide-based due to body habitus.   Stressed gait intact.  Data: EMG of the left leg 05/08/2013:  This is a normal electrodiagnostic study of the right lower extremity.   MRI brain 04/24/2013:   1. Partially empty sella configuration, new since 2007. In this clinical setting consider idiopathic intracranial hypertension  (pseudotumor cerebri), although this appearance of the  pituitary can be a normal anatomic variant.  2. Otherwise largely stable and normal for age MRI appearance of the brain since 2007.   CSF 05/14/2013:  OP 30   R0 W0  G53  P27, ACE neg CSF 06/24/2013:  OP 37  R1  W0  G48  P34, ACE neg  Labs 07/08/2013:  Na 140, K 3.4, Chl 107, CO2 25, Cr 1.09, BUN 17, AST 39, ALT 43, HbA1c 5.7   IMPRESSION/PLAN 1.  Transient right hemisensory loss  - No neurological deficits on exam makes localization difficult, but with face/arm/leg involvement, need to exclude intracranial pathology  - With her history of sarcoidosis, will get MRI brain wwo contrast  2.  Pseudotumor cerebri, diagnosed 05/2013 (OP 30cm)  - Transient worsening of symptoms in late March 2015, s/p 2nd large volume tap (OP 37)   - Evaluated by neurosurgery who does not recommend VP shunt due to no visual symptoms  - No papilledema today despite being off medications  - Follow clinically, avoid topiramate due to her also having glaucoma  2. Chronic daily headaches  - Start nortriptyline 10mg  at bedtime for 2 week, then increase to 2 tablet at bedtime  - Limit all rescue OTC medications to twice per week   5.  Return to clinic in 4 months  The duration of this appointment visit was 40 minutes of face-to-face time with the patient.  Greater than 50% of this time was spent in counseling, explanation of diagnosis, planning of further management, and coordination of care.   Thank you for allowing me to participate in patient's care.  If I can answer any additional questions, I would be pleased to do so.    Sincerely,    Donika K. Posey Pronto, DO

## 2016-02-14 ENCOUNTER — Other Ambulatory Visit: Payer: Self-pay | Admitting: Neurology

## 2016-02-17 ENCOUNTER — Other Ambulatory Visit: Payer: BC Managed Care – PPO

## 2016-02-17 ENCOUNTER — Ambulatory Visit
Admission: RE | Admit: 2016-02-17 | Discharge: 2016-02-17 | Disposition: A | Discharge status: Home / Self Care | Payer: BC Managed Care – PPO | Source: Ambulatory Visit | Attending: Neurology | Admitting: Neurology

## 2016-02-17 DIAGNOSIS — R200 Anesthesia of skin: Secondary | ICD-10-CM

## 2016-02-17 DIAGNOSIS — R202 Paresthesia of skin: Secondary | ICD-10-CM

## 2016-02-17 DIAGNOSIS — D869 Sarcoidosis, unspecified: Secondary | ICD-10-CM

## 2016-02-17 DIAGNOSIS — R209 Unspecified disturbances of skin sensation: Secondary | ICD-10-CM

## 2016-02-17 DIAGNOSIS — G932 Benign intracranial hypertension: Secondary | ICD-10-CM

## 2016-02-17 MED ORDER — GADOBENATE DIMEGLUMINE 529 MG/ML IV SOLN
20.0000 mL | Freq: Once | INTRAVENOUS | Status: AC | PRN
  Administered 2016-02-17: 20 mL via INTRAVENOUS

## 2016-02-18 ENCOUNTER — Other Ambulatory Visit: Payer: Self-pay | Admitting: *Deleted

## 2016-02-18 ENCOUNTER — Encounter: Payer: Self-pay | Admitting: Neurology

## 2016-02-18 MED ORDER — ACETAZOLAMIDE ER 500 MG PO CP12: 500.0000 mg | ORAL_CAPSULE | Freq: Two times a day (BID) | ORAL | 3 refills | Status: DC

## 2016-02-22 ENCOUNTER — Other Ambulatory Visit: Payer: Self-pay | Admitting: *Deleted

## 2016-02-22 DIAGNOSIS — G932 Benign intracranial hypertension: Secondary | ICD-10-CM

## 2016-02-25 ENCOUNTER — Encounter (HOSPITAL_COMMUNITY): Payer: Self-pay | Admitting: Emergency Medicine

## 2016-02-25 DIAGNOSIS — G932 Benign intracranial hypertension: Secondary | ICD-10-CM | POA: Insufficient documentation

## 2016-02-25 DIAGNOSIS — Z7982 Long term (current) use of aspirin: Secondary | ICD-10-CM | POA: Diagnosis not present

## 2016-02-25 DIAGNOSIS — E119 Type 2 diabetes mellitus without complications: Secondary | ICD-10-CM | POA: Diagnosis not present

## 2016-02-25 DIAGNOSIS — Z79899 Other long term (current) drug therapy: Secondary | ICD-10-CM | POA: Diagnosis not present

## 2016-02-25 DIAGNOSIS — R200 Anesthesia of skin: Secondary | ICD-10-CM | POA: Diagnosis not present

## 2016-02-25 DIAGNOSIS — J45909 Unspecified asthma, uncomplicated: Secondary | ICD-10-CM | POA: Diagnosis not present

## 2016-02-25 DIAGNOSIS — R51 Headache: Secondary | ICD-10-CM | POA: Diagnosis present

## 2016-02-25 LAB — COMPREHENSIVE METABOLIC PANEL
ALBUMIN: 4.3 g/dL (ref 3.5–5.0)
ALT: 22 U/L (ref 14–54)
AST: 20 U/L (ref 15–41)
Alkaline Phosphatase: 68 U/L (ref 38–126)
Anion gap: 13 (ref 5–15)
BUN: 14 mg/dL (ref 6–20)
CHLORIDE: 104 mmol/L (ref 101–111)
CO2: 21 mmol/L — ABNORMAL LOW (ref 22–32)
Calcium: 10 mg/dL (ref 8.9–10.3)
Creatinine, Ser: 1.25 mg/dL — ABNORMAL HIGH (ref 0.44–1.00)
GFR calc Af Amer: >60 mL/min (ref >60)
GFR calc non Af Amer: 53 mL/min — ABNORMAL LOW (ref >60)
GLUCOSE: 92 mg/dL (ref 65–99)
POTASSIUM: 3.5 mmol/L (ref 3.5–5.1)
SODIUM: 138 mmol/L (ref 135–145)
Total Bilirubin: 0.4 mg/dL (ref 0.3–1.2)
Total Protein: 7.5 g/dL (ref 6.5–8.1)

## 2016-02-25 LAB — DIFFERENTIAL
BASOS ABS: 0 10*3/uL (ref 0.0–0.1)
BASOS PCT: 0 %
EOS ABS: 0.1 10*3/uL (ref 0.0–0.7)
Eosinophils Relative: 1 %
Lymphocytes Relative: 25 %
Lymphs Abs: 2.8 10*3/uL (ref 0.7–4.0)
Monocytes Absolute: 0.6 10*3/uL (ref 0.1–1.0)
Monocytes Relative: 5 %
NEUTROS PCT: 69 %
Neutro Abs: 7.5 10*3/uL (ref 1.7–7.7)

## 2016-02-25 LAB — CBC
HCT: 38.6 % (ref 36.0–46.0)
Hemoglobin: 13 g/dL (ref 12.0–15.0)
MCH: 28.8 pg (ref 26.0–34.0)
MCHC: 33.7 g/dL (ref 30.0–36.0)
MCV: 85.6 fL (ref 78.0–100.0)
Platelets: 453 10*3/uL — ABNORMAL HIGH (ref 150–400)
RBC: 4.51 MIL/uL (ref 3.87–5.11)
RDW: 15.2 % (ref 11.5–15.5)
WBC: 11.1 10*3/uL — ABNORMAL HIGH (ref 4.0–10.5)

## 2016-02-25 LAB — CBG MONITORING, ED: Glucose-Capillary: 84 mg/dL (ref 65–99)

## 2016-02-25 NOTE — ED Triage Notes (Signed)
C/o intermittent bilateral leg numbness x 2 months that has been constant x 1 week.  Also reports R arm and R cheek numbness.  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  Constant headache the last 2 days.

## 2016-02-26 ENCOUNTER — Emergency Department (HOSPITAL_COMMUNITY)
Admission: EM | Admit: 2016-02-26 | Discharge: 2016-02-26 | Disposition: A | Discharge status: Home / Self Care | Payer: BC Managed Care – PPO | Attending: Emergency Medicine | Admitting: Emergency Medicine

## 2016-02-26 DIAGNOSIS — R200 Anesthesia of skin: Secondary | ICD-10-CM

## 2016-02-26 DIAGNOSIS — G4489 Other headache syndrome: Secondary | ICD-10-CM

## 2016-02-26 DIAGNOSIS — G932 Benign intracranial hypertension: Secondary | ICD-10-CM

## 2016-02-26 LAB — CSF CELL COUNT WITH DIFFERENTIAL
EOS CSF: 0 % (ref 0–1)
EOS CSF: 0 % (ref 0–1)
RBC COUNT CSF: 2 /mm3 — AB
RBC Count, CSF: 4250 /mm3 — ABNORMAL HIGH
TUBE #: 1
TUBE #: 4
WBC, CSF: 0 /mm3 (ref 0–5)
WBC, CSF: 6 /mm3 — ABNORMAL HIGH (ref 0–5)

## 2016-02-26 LAB — GLUCOSE, CSF: Glucose, CSF: 55 mg/dL (ref 40–70)

## 2016-02-26 LAB — PROTEIN, CSF: Total  Protein, CSF: 32 mg/dL (ref 15–45)

## 2016-02-26 MED ORDER — DIPHENHYDRAMINE HCL 50 MG/ML IJ SOLN
25.0000 mg | Freq: Once | INTRAMUSCULAR | Status: AC
  Administered 2016-02-26: 25 mg via INTRAVENOUS
  Filled 2016-02-26: qty 1

## 2016-02-26 MED ORDER — METOCLOPRAMIDE HCL 5 MG/ML IJ SOLN
10.0000 mg | Freq: Once | INTRAMUSCULAR | Status: AC
  Administered 2016-02-26: 10 mg via INTRAVENOUS
  Filled 2016-02-26: qty 2

## 2016-02-26 MED ORDER — SODIUM CHLORIDE 0.9 % IV BOLUS (SEPSIS)
1000.0000 mL | Freq: Once | INTRAVENOUS | Status: AC
  Administered 2016-02-26: 1000 mL via INTRAVENOUS

## 2016-02-26 NOTE — ED Notes (Signed)
LP preformed by Dr. Christy Gentles at pt bedside. Sterile procedure. Consent obtained. Pt tolerated well

## 2016-02-26 NOTE — ED Provider Notes (Signed)
[facility] DEPT Provider Note   CSN: [id number] Arrival date & time: 02/25/16  2029  By signing my name below, I, Denise Cook. [name], attest that this documentation has been prepared under the direction and in the presence of Ripley Fraise, MD.  Electronically Signed: Maud Cook. [name], ED Scribe. 02/26/16. 1:34 AM.    History   Chief Complaint Chief Complaint  Patient presents with  . Headache  . Numbness   The history is provided by the patient. No language interpreter was used.  Headache   This is a recurrent problem. The current episode started 2 days ago. The problem occurs constantly. The problem has been gradually worsening. The pain is located in the right unilateral region. The pain is moderate. The pain does not radiate. Associated symptoms include nausea. Pertinent negatives include no fever, no malaise/fatigue, no chest pressure, no shortness of breath and no vomiting.    HPI Comments: Denise Cook is a 40 y.o. female with a PMHx of pseudo cerebri tumor, sarcoidosis, and DM who presents to the Emergency Department complaining of intermittent, chronic in nature bilateral leg numbness x 2 months that recently became constant 1 week ago. Pt states she is unable to stand long enough to take a shower due to numbness which is unusual for her. She also reports mild nausea, numbness to the R cheek and R arm along with worsening blurry vision x 3 days and a constant, recurrent HA x 2 days. No aggravating or alleviating factors at this time. Currently patient is taking Diamox and states her symptoms worsened after starting medication. No recent fever, chills, vomiting, chest pain, or shortness of breath. Pt reports a history of similar and has a lumbar puncture scheduled from 03/06/16.  PCP: Vena Austria, MD    Past Medical History:  Diagnosis Date  . Allergy   . Asthma   . Depression   . Diabetes mellitus    diet control  . Glaucoma   . Obesity   . Pseudotumor  cerebri   . Sarcoidosis ([place]) 2012    Patient Active Problem List   Diagnosis Date Noted  . Pseudotumor cerebri 04/21/2013  . Leg pain, bilateral 04/15/2013    Past Surgical History:  Procedure Laterality Date  . ABDOMINAL HYSTERECTOMY    . CARDIAC CATHETERIZATION    . CHOLECYSTECTOMY    . CORONARY ARTERY BYPASS GRAFT    . EYE SURGERY Left    Removed vitreous membrane  . glaucoma shunt    . INCISION AND DRAINAGE Right 05/01/2014   Procedure: INCISION AND DRAINAGE RIGHT INDEX FLEXOR SHEATH  ;  Surgeon: Charlotte Crumb, MD;  Location: [facility];  Service: Orthopedics;  Laterality: Right;  . left eye surgery      OB History    No data available       Home Medications    Prior to Admission medications   Medication Sig Start Date End Date Taking? Authorizing Provider  acetaminophen (TYLENOL) 500 MG tablet Take 1,000 mg by mouth every 6 (six) hours as needed for headache. Reported on 07/06/2015   Yes Historical Provider, MD  acetaZOLAMIDE (DIAMOX) 500 MG capsule Take 1 capsule (500 mg total) by mouth 2 (two) times daily. 02/18/16  Yes Donika K Patel, DO  Albiglutide (TANZEUM) 50 MG PEN Inject 50 mg into the skin once a week.    Yes Historical Provider, MD  albuterol (PROVENTIL) (2.5 MG/3ML) 0.083% nebulizer solution Take 2.5 mg by nebulization every 6 (six) hours as needed for  wheezing or shortness of breath.   Yes Historical Provider, MD  aspirin-acetaminophen-caffeine (EXCEDRIN MIGRAINE) [phone] MG per tablet Take 1 tablet by mouth every 6 (six) hours as needed for headache. Reported on 07/06/2015   Yes Historical Provider, MD  Aspirin-Acetaminophen-Caffeine (GOODY HEADACHE PO) Take 1 Package by mouth daily as needed (for pain). Reported on 07/06/2015   Yes Historical Provider, MD  azaTHIOprine (IMURAN) 50 MG tablet Take 200 mg by mouth every evening.    Yes Historical Provider, MD  Cholecalciferol (VITAMIN D3) 5000 units CAPS Take 5,000 Units by mouth every morning.     Yes Historical Provider, MD  cyclobenzaprine (FLEXERIL) 10 MG tablet Take 1 tablet (10 mg total) by mouth every 8 (eight) hours as needed for muscle spasms. 03/01/15  Yes Benjamine Mola, FNP  dorzolamide-timolol (COSOPT) 22.3-6.8 MG/ML ophthalmic solution Place 1 drop into both eyes 2 (two) times daily.   Yes Historical Provider, MD  levocetirizine (XYZAL) 5 MG tablet Take 5 mg by mouth 2 (two) times daily.    Yes Historical Provider, MD  lisdexamfetamine (VYVANSE) 50 MG capsule Take 50 mg by mouth every morning.   Yes Historical Provider, MD  lisinopril (PRINIVIL,ZESTRIL) 5 MG tablet Take 5 mg by mouth daily.   Yes Historical Provider, MD  Magnesium 250 MG TABS Take 250 mg by mouth 2 (two) times daily.    Yes Historical Provider, MD  nortriptyline (PAMELOR) 10 MG capsule Start nortriptyline 10mg  at bedtime for 2 week, then increase to 2 tablet at bedtime 02/11/16  Yes Donika K Patel, DO  ondansetron (ZOFRAN ODT) 4 MG disintegrating tablet Take 1 tablet (4 mg total) by mouth every 8 (eight) hours as needed for nausea or vomiting. 09/14/15  Yes Bryan R Hess, DO  prednisoLONE acetate (PRED FORTE) 1 % ophthalmic suspension Place 1 drop into both eyes 2 (two) times daily.   Yes Historical Provider, MD    Family History Family History  Problem Relation Age of Onset  . Adopted: Yes  . Healthy Daughter   . Cancer Maternal Grandmother   . Alcoholism Mother   . Diabetes Paternal Grandmother     Social History Social History  Substance Use Topics  . Smoking status: Never Smoker  . Smokeless tobacco: Never Used  . Alcohol use No     Allergies   Metformin and related and Strawberry extract   Review of Systems Review of Systems  Constitutional: Negative for chills, fever and malaise/fatigue.  Eyes: Positive for visual disturbance.  Respiratory: Negative for cough and shortness of breath.   Cardiovascular: Negative for chest pain.  Gastrointestinal: Positive for nausea. Negative for  vomiting.  Neurological: Positive for numbness and headaches.  Psychiatric/Behavioral: Negative for confusion.  All other systems reviewed and are negative.    Physical Exam Updated Vital Signs BP 103/74   Pulse 88   Temp 98.7 F (37.1 C) (Oral)   Resp 23   Ht 5\' 1"  (1.549 m)   Wt 250 lb (113.4 kg)   SpO2 100%   BMI 47.24 kg/m   Physical Exam  CONSTITUTIONAL: Well developed/well nourished HEAD: Normocephalic/atraumatic EYES: EOMI/PERRL. Unable to perform fundoscopic exam  ENMT: Mucous membranes moist NECK: supple no meningeal signs SPINE/BACK:entire spine nontender CV: S1/S2 noted, no murmurs/rubs/gallops noted LUNGS: Lungs are clear to auscultation bilaterally, no apparent distress ABDOMEN: soft, nontender, obese GU:no cva tenderness NEURO: Pt is awake/alert/appropriate, moves all extremitiesx4.  No facial droop.  No arm or leg drift. She is able to ambulate. No past  pointing. Pt reports numbness to R face and R arm EXTREMITIES: pulses normal/equal, full ROM SKIN: warm, color normal PSYCH: no abnormalities of mood noted, alert and oriented to situation   ED Treatments / Results   DIAGNOSTIC STUDIES: Oxygen Saturation is 100% on RA, Normal by my interpretation.    COORDINATION OF CARE: 1:29 AM- Will order blood work and EKG. Discussed treatment plan with pt at bedside and pt agreed to plan.     Labs (all labs ordered are listed, but only abnormal results are displayed) Labs Reviewed  CBC - Abnormal; Notable for the following:       Result Value   WBC 11.1 (*)    Platelets 453 (*)    All other components within normal limits  COMPREHENSIVE METABOLIC PANEL - Abnormal; Notable for the following:    CO2 21 (*)    Creatinine, Ser 1.25 (*)    GFR calc non Af Amer 53 (*)    All other components within normal limits  CSF CELL COUNT WITH DIFFERENTIAL - Abnormal; Notable for the following:    Color, CSF STRAW (*)    Appearance, CSF CLOUDY (*)    RBC Count, CSF 4,250  (*)    WBC, CSF 6 (*)    All other components within normal limits  CSF CELL COUNT WITH DIFFERENTIAL - Abnormal; Notable for the following:    RBC Count, CSF 2 (*)    All other components within normal limits  CSF CULTURE  DIFFERENTIAL  GLUCOSE, CSF  PROTEIN, CSF  CBG MONITORING, ED  CBG MONITORING, ED    EKG  EKG Interpretation  Date/Time:  Friday February 25 2016 20:35:53 EST Ventricular Rate:  93 PR Interval:  150 QRS Duration: 88 QT Interval:  368 QTC Calculation: 457 R Axis:   2 Text Interpretation:  Normal sinus rhythm Nonspecific T wave abnormality Abnormal ECG No significant change since last tracing Confirmed by Christy Gentles  MD, Gaberiel Youngblood ([id number]) on 02/26/2016 1:01:21 AM       Radiology No results found.  Procedures .Lumbar Puncture Date/Time: 02/26/2016 4:17 AM Performed by: Ripley Fraise Authorized by: Ripley Fraise   Consent:    Consent obtained:  Written   Consent given by:  Patient   Risks discussed:  Bleeding, infection and pain   Alternatives discussed:  No treatment Pre-procedure details:    Procedure purpose:  Therapeutic   Preparation: Patient was prepped and draped in usual sterile fashion   Anesthesia (see MAR for exact dosages):    Anesthesia method:  Local infiltration Procedure details:    Lumbar space:  L3-L4 interspace   Patient position:  Sitting   Needle gauge:  22   Number of attempts:  1   Fluid appearance:  Clear   Tubes of fluid:  4   Total volume (ml):  13 Post-procedure:    Puncture site:  Adhesive bandage applied   Patient tolerance of procedure:  Tolerated well, no immediate complications Comments:     PATIENT WITH H/O PSEUDOTUMOR WITH WORSENING HEADACHE, DIZZINESS AND VISUAL CHANGES DUE TO BODY HABITUS, I DECIDED TO HAVE PATIENT SIT UP FOR PROCEDURE (UNLIKELY TO HAVE SUCCESS WITH HER IN LEFT LATERAL DECUBITUS POSITION) BUT WAS UNABLE TO OBTAIN OPENING PRESSURE.  PT FELT IMPROVED AFTER PROCEDURE   (including critical  care time)  Medications Ordered in ED Medications  metoCLOPramide (REGLAN) injection 10 mg (10 mg Intravenous Given 02/26/16 0153)  diphenhydrAMINE (BENADRYL) injection 25 mg (25 mg Intravenous Given 02/26/16 0154)  sodium chloride 0.9 %  bolus 1,000 mL (0 mLs Intravenous Stopped 02/26/16 0625)  sodium chloride 0.9 % bolus 1,000 mL (0 mLs Intravenous Stopped 02/26/16 0625)     Initial Impression / Assessment and Plan / ED Course  I have reviewed the triage vital signs and the nursing notes.  Pertinent labs esults that were available during my care of the patient were reviewed by me and considered in my medical decision making (see chart for details).  Clinical Course     Pt with long h/o pseudotumor She is scheduled to have LP next month However, she is now having dizziness and new visual symptoms (this is different from last neuro note) I am concerned her pseudotumor is worsening with visual symptoms I offered emergent LP and she accepted Pt tolerated procedure well 6:27 AM Pt improved HA improved Visual symptoms improved She is ambulatory Will d/c home She will call neuro in 2 days Continue diamox for now Will need to cancel elective LP  Final Clinical Impressions(s) / ED Diagnoses   Final diagnoses:  Other headache syndrome  Numbness  Pseudotumor cerebri    New Prescriptions New Prescriptions   No medications on file  I personally performed the services described in this documentation, which was scribed in my presence. The recorded information has been reviewed and is accurate.        Ripley Fraise, MD 02/26/16 [phone]

## 2016-02-26 NOTE — Discharge Instructions (Signed)
°  SEEK MEDICAL ATTENTION IF: ° °You develop possible problems with medications prescribed.  °The medications don't resolve your headache, if it recurs , or if you have multiple episodes of vomiting or can't take fluids. °You have a change from the usual headache. ° °RETURN IMMEDIATELY IF you develop a sudden, severe headache or confusion, become poorly responsive or faint, develop a fever above 100.4F or problem breathing, have a change in speech, vision, swallowing, or understanding, or develop new weakness, numbness, tingling, incoordination, or have a seizure. ° °

## 2016-02-26 NOTE — ED Notes (Signed)
  PT ambulated independently with steady gait

## 2016-02-28 ENCOUNTER — Telehealth: Payer: Self-pay | Admitting: Neurology

## 2016-02-28 NOTE — Telephone Encounter (Signed)
Denise Cook [birth year]. She was seen in the ER on Friday 02/25/16. She had a Lumbar Puncture which she has one scheduled for 03/06/16. They went ahead and did that at the ER. She was told to follow back up with Dr. Posey Pronto to let her know. She has an appointment for 06/12/16 to follow up with Dr. Posey Pronto. Not sure if she needs in sooner? Her # is [phone]. Thank you

## 2016-02-28 NOTE — Telephone Encounter (Signed)
LP cancelled at [facility].  Please advise about the headache and dizziness.  Do you want her in sooner?

## 2016-02-28 NOTE — Telephone Encounter (Signed)
She said her head still hurts and dizziness. The numbness is better.

## 2016-02-28 NOTE — Telephone Encounter (Signed)
Patient given instructions per Dr. Posey Pronto and agreed with plan. She will call back within a few days if not doing much better.

## 2016-02-28 NOTE — Telephone Encounter (Signed)
Recommend increasing diamox to 750mg  twice daily (1.5 tab).  She is welcome to return to the clinic going up on the medication does not help.  Donika K. Posey Pronto, DO

## 2016-02-29 ENCOUNTER — Ambulatory Visit (INDEPENDENT_AMBULATORY_CARE_PROVIDER_SITE_OTHER): Payer: BC Managed Care – PPO | Admitting: *Deleted

## 2016-02-29 ENCOUNTER — Encounter: Payer: Self-pay | Admitting: Neurology

## 2016-02-29 DIAGNOSIS — R51 Headache: Secondary | ICD-10-CM | POA: Diagnosis not present

## 2016-02-29 DIAGNOSIS — R519 Headache, unspecified: Secondary | ICD-10-CM

## 2016-02-29 LAB — CSF CULTURE W GRAM STAIN

## 2016-02-29 LAB — CSF CULTURE
CULTURE: NO GROWTH
SPECIAL REQUESTS: NORMAL

## 2016-02-29 MED ORDER — KETOROLAC TROMETHAMINE 60 MG/2ML IM SOLN
30.0000 mg | Freq: Once | INTRAMUSCULAR | Status: AC
  Administered 2016-02-29: 30 mg via INTRAMUSCULAR

## 2016-02-29 MED ORDER — KETOROLAC TROMETHAMINE 30 MG/ML IJ SOLN: 30.0000 mg | Freq: Once | INTRAMUSCULAR | Status: DC

## 2016-03-01 ENCOUNTER — Encounter: Payer: Self-pay | Admitting: Neurology

## 2016-03-02 ENCOUNTER — Encounter: Payer: Self-pay | Admitting: Neurology

## 2016-03-02 ENCOUNTER — Ambulatory Visit (INDEPENDENT_AMBULATORY_CARE_PROVIDER_SITE_OTHER): Payer: BC Managed Care – PPO | Admitting: Neurology

## 2016-03-02 VITALS — BP 108/62 | HR 82 | Ht 61.0 in | Wt 243.0 lb

## 2016-03-02 DIAGNOSIS — G932 Benign intracranial hypertension: Secondary | ICD-10-CM | POA: Diagnosis not present

## 2016-03-02 DIAGNOSIS — G971 Other reaction to spinal and lumbar puncture: Secondary | ICD-10-CM

## 2016-03-02 MED ORDER — BUTALBITAL-APAP-CAFFEINE 50-325-40 MG PO TABS: 1.0000 | ORAL_TABLET | Freq: Four times a day (QID) | ORAL | 0 refills | Status: DC | PRN

## 2016-03-02 NOTE — Patient Instructions (Addendum)
1.  Start using fioricet every 6 hours for headache. 2.  Stay well hydrated 3.  We will arrange for a blood patch procedure as your headaches  Call my office next week, if there is no improvement

## 2016-03-02 NOTE — Progress Notes (Signed)
Follow-up Visit   Date: 03/02/16    [name] MRN: [id number] DOB: [birth year]   Interim History: Denise Cook is a 40 y.o. 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  History of present illness: In 2013, she developed right leg pain after falling down her steps with associated swelling of the lower leg, but sparing the foot. Pain is described as achy and involves only the anterior aspect of the leg along the bone, about 2 inches about the ankle and 4inches below the knee. It is worse with activity, prolonged standing or walking, and tender to touch. She denies any numbness, tingling, or weakness. She has tried a TENS unit without benefit. Nothing seems to alleviate her pain. Previous work-up has included orthopeadic evaluation who performed MRI of the leg without contrast and showed mild subcutaneous edema. She was managed as possible tibial stress syndrome with therapy, applying ice/heat, elevating legs, and stockings without improvement.   She also complains of daily headache for the past two months. In November, she was interviewing for a new job and reports being very stressed. She developed a headache at the base of her head, described as throbbing ache which has not improved since onset. She has nausea, photophobia, and phonophobia. Headache is worse with increased stress and it is improved with rest and relaxation. Denies morning headaches or of symptoms with changes in position. She has tried toradol injection, tramadol, hydrocodone, and exedrin migraine without any relief.   Of note, the dose of imuran was increased to 200mg .   - Follow-up 05/16/2013:  She had a GON block on 1/19 for headaches  without significant benefits.  Subsequently MRI of the brain was done which was concerning for pseudotumor cerebri which was confirmed by LP which showed an OP of 61mmHg.  She did not have any change in headaches following LP.  EMG of the right leg was normal.  She saw her ophthalmologist (Dr. Nehemiah Massed) who did not find any evidence of increased IOP.    - Follow-up 07/18/2013:  She had worsening headaches, despite increasing diamox so underwent second LP which showed OP 37.  Headaches resolved after LP, but quickly returned within 2-days.  Headaches were even worse than before, so diamox was increased to 1000 BID.  She continued to headaches so lasix was added and was referred to neurosurgery who did not recommend shunt due to no papilledema and vision symptoms.  About 10-days ago (soon after started lasix), headaches slowly improved to 4/10 most days, when severe 10/10 (occuring 3-times per week).  Her potassium was checked and found to be low 2.9 and she has subsequently been started on potassium 36mEq twice daily.  - UPDATE 02/11/2016:  Patient scheduled return visit for numbness of the legs.  She was last seen in April 2015 for headaches related to psuedotumor cerebri and was taking Diamox 1000mg  BID and lasix 20mg  daily.  She discontinued these medications in August 2016 during which time she had aqueous shunt placed in her right eye in August 2016 due to glaucoma.  Her headaches did not significantly worsen despite stopping her medications.  She continues to have daily dull headaches, but does not usually take anything for it.  No new vision complaints. 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  Starting around September 2017, she began having spells of right leg numbnes over the entire thigh, but sparing the lower leg.  She reports that she is falling more because her right leg feels numb with sitting or prolonged standing. She usually leans against something and then shakes her  leg, which usually resolves symptoms.  It can last anywhere from 63min - 30 minutes and occurs daily.  She also feels numbness of the right side of her jaw and arm which is sporadic occurring about 4 time in the past two weeks, this lasts a few minutes.  No associated weakness.  - UPDATE 03/02/2016:  She went to the ER on 11/25 because of worsening headaches and had LP with 13cc CSF removed. Unfortunately opening pressure was not measured.  She did not appreciate a marked change in the intensity of her pain.  The following day, she developed throbbing headache, worse when she is active and improved with laying flat.  She called this week with worsening headaches which kept her out from work and was given toradol injection, without any benefit.  Her headaches are very different from her previous headaches, because they do not seem to improve with anything.  She has been sleeping most evenings.   She does report improvement of bilateral leg paresthesias.   Medications:  Current Outpatient Prescriptions on File Prior to Visit  Medication Sig Dispense Refill  . acetaminophen (TYLENOL) 500 MG tablet Take 1,000 mg by mouth every 6 (six) hours as needed for headache. Reported on 07/06/2015    . acetaZOLAMIDE (DIAMOX) 500 MG capsule Take 1 capsule (500 mg total) by mouth 2 (two) times daily. 60 capsule 3  . Albiglutide (TANZEUM) 50 MG PEN Inject 50 mg into the skin once a week.     Marland Kitchen albuterol (PROVENTIL) (2.5 MG/3ML) 0.083% nebulizer solution Take 2.5 mg by nebulization every 6 (six) hours as needed for wheezing or shortness of breath.    . azaTHIOprine (IMURAN) 50 MG tablet Take 200 mg by mouth every evening.     . Cholecalciferol (VITAMIN D3) 5000 units CAPS Take 5,000 Units by mouth every morning.     . cyclobenzaprine (FLEXERIL) 10 MG tablet Take 1 tablet (10 mg total) by mouth every 8 (eight) hours as needed for muscle spasms. 21 tablet 0  . dorzolamide-timolol (COSOPT) 22.3-6.8 MG/ML ophthalmic solution  Place 1 drop into both eyes 2 (two) times daily.    Marland Kitchen levocetirizine (XYZAL) 5 MG tablet Take 5 mg by mouth 2 (two) times daily.     Marland Kitchen lisdexamfetamine (VYVANSE) 50 MG capsule Take 50 mg by mouth every morning.    Marland Kitchen lisinopril (PRINIVIL,ZESTRIL) 5 MG tablet Take 5 mg by mouth daily.    . Magnesium 250 MG TABS Take 250 mg by mouth 2 (two) times daily.     . nortriptyline (PAMELOR) 10 MG capsule Start nortriptyline 10mg  at bedtime for 2 week, then increase to 2 tablet at bedtime 60 capsule 5  . ondansetron (ZOFRAN ODT) 4 MG disintegrating tablet Take 1 tablet (4 mg total) by mouth every 8 (eight) hours as needed for nausea or vomiting. 20 tablet 0  . prednisoLONE acetate (PRED FORTE) 1 % ophthalmic suspension Place 1 drop into both eyes 2 (two) times daily.     No current facility-administered medications on file prior to visit.     Allergies:  Allergies  Allergen Reactions  . Metformin And Related Itching  . Strawberry Extract Itching and Swelling  Review of Systems:  CONSTITUTIONAL: No fevers, chills, night sweats, or weight loss.  +headaches EYES: No visual changes or eye pain ENT: No hearing changes.  No history of nose bleeds.   RESPIRATORY: No cough, wheezing and shortness of breath.   CARDIOVASCULAR: Negative for chest pain, and palpitations.   GI: Negative for abdominal discomfort, blood in stools or black stools.  No recent change in bowel habits.   GU:  No history of incontinence.   MUSCLOSKELETAL: No history of joint pain or swelling.  No myalgias.   SKIN: Negative for lesions, rash, and itching.   ENDOCRINE: Negative for cold or heat intolerance, polydipsia or goiter.   PSYCH:  No depression or anxiety symptoms.   NEURO: As Above.   Vital Signs:  BP 108/62   Pulse 82   Ht 5\' 1"  (1.549 m)   Wt 243 lb (110.2 kg)   BMI 45.91 kg/m   Neurological Exam: MENTAL STATUS including orientation to time, place, person is normal.  Speech is not dysarthric.  She appears  uncomfortable due to pain.   CRANIAL NERVES: No visual field defects. Fundoscopic examination does not show any papilledema, disc margins.  Pupils equal round and reactive to light.  Normal conjugate, extra-ocular eye movements in all directions of gaze.  Face is symmetric. Palate elevates symmetrically.  Tongue is midline.  MOTOR:  Motor strength is 5/5 in all extremities.   MSRs:  Reflexes are 2+/4 throughout  COORDINATION/GAIT:  Gait is slightly wide-based due to body habitus.     Data: EMG of the left leg 05/08/2013:  This is a normal electrodiagnostic study of the right lower extremity.   MRI brain 04/24/2013:   1. Partially empty sella configuration, new since 2007. In this clinical setting consider idiopathic intracranial hypertension  (pseudotumor cerebri), although this appearance of the pituitary can be a normal anatomic variant.  2. Otherwise largely stable and normal for age MRI appearance of the brain since 2007.   CSF 05/14/2013:  OP 30   R0 W0  G53  P27, ACE neg CSF 06/24/2013:  OP 37  R1  W0  G48  P34, ACE neg  Labs 07/08/2013:  Na 140, K 3.4, Chl 107, CO2 25, Cr 1.09, BUN 17, AST 39, ALT 43, HbA1c 5.7   IMPRESSION/PLAN 1.  Post LP headache.  Headaches are positional and worse with being upright, improved with laying supine, and started after her LP.    - Start Fioricet q6h prn headache  - We will arrange for blood patch  - Work excuse provided  2.  Transient right hemisensory loss - resolved  3.  Pseudotumor cerebri, diagnosed 05/2013 (OP 30cm)  - Transient worsening of symptoms in late March 2015, s/p 2nd large volume tap (OP 37)   - Evaluated by neurosurgery who does not recommend VP shunt due to no visual symptoms.  Avoid topiramate due to glaucoma  - Continue Diamox 500mg  twice daily.  4. Chronic daily headaches  - Continue nortriptyline 20mg  at bedtime    Return to clinic in 3 months  The duration of this appointment visit was 30 minutes of face-to-face time  with the patient.  Greater than 50% of this time was spent in counseling, explanation of diagnosis, planning of further management, and coordination of care.   Thank you for allowing me to participate in patient's care.  If I can answer any additional questions, I would be pleased to do so.    Sincerely,    Christoph Copelan K.  Posey Pronto, DO

## 2016-03-03 ENCOUNTER — Ambulatory Visit
Admission: RE | Admit: 2016-03-03 | Discharge: 2016-03-03 | Disposition: A | Discharge status: Home / Self Care | Payer: BC Managed Care – PPO | Source: Ambulatory Visit | Attending: Neurology | Admitting: Neurology

## 2016-03-03 DIAGNOSIS — G971 Other reaction to spinal and lumbar puncture: Secondary | ICD-10-CM

## 2016-03-03 NOTE — Discharge Instructions (Signed)

## 2016-03-06 ENCOUNTER — Other Ambulatory Visit: Payer: BC Managed Care – PPO

## 2016-06-12 ENCOUNTER — Ambulatory Visit (INDEPENDENT_AMBULATORY_CARE_PROVIDER_SITE_OTHER): Payer: BC Managed Care – PPO | Admitting: Neurology

## 2016-06-12 ENCOUNTER — Encounter: Payer: Self-pay | Admitting: Neurology

## 2016-06-12 VITALS — BP 110/74 | HR 86 | Ht 61.0 in | Wt 225.4 lb

## 2016-06-12 DIAGNOSIS — G44219 Episodic tension-type headache, not intractable: Secondary | ICD-10-CM

## 2016-06-12 DIAGNOSIS — G932 Benign intracranial hypertension: Secondary | ICD-10-CM

## 2016-06-12 NOTE — Progress Notes (Signed)
Follow-up Visit   Date: 06/12/16    Denise Cook MRN: [id number] DOB: 12/09/1975   Interim History: Denise Cook is a 41 y.o. left-handed African American female with history of  ocular sarcoidosis (diagnosed in 2012 at [facility], currently imuran 200mg  and prednisolone eye drops), asthma, positive PPD s/p INH and rifampin (2008), vitamin B12 deficiency, and diabetes mellitis (HbA1c 5.9) returning to the clinic for follow-up of pseudotumor cerebri.  History of present illness: In 2013, she developed right leg pain after falling down her steps with associated swelling of the lower leg, but sparing the foot. Pain is described as achy and involves only the anterior aspect of the leg along the bone, about 2 inches about the ankle and 4inches below the knee. It is worse with activity, prolonged standing or walking, and tender to touch. She denies any numbness, tingling, or weakness. She has tried a TENS unit without benefit. Nothing seems to alleviate her pain. Previous work-up has included orthopeadic evaluation who performed MRI of the leg without contrast and showed mild subcutaneous edema. She was managed as possible tibial stress syndrome with therapy, applying ice/heat, elevating legs, and stockings without improvement.   She also complains of daily headache for the past two months. In November, she was interviewing for a new job and reports being very stressed. She developed a headache at the base of her head, described as throbbing ache which has not improved since onset. She has nausea, photophobia, and phonophobia. Headache is worse with increased stress and it is improved with rest and relaxation. Denies morning headaches or of symptoms with changes in position. She has tried toradol injection, tramadol, hydrocodone, and exedrin migraine without any relief.   Of note, the dose of imuran was increased to 200mg .   - Follow-up 05/16/2013:  She had a GON block on 1/19 for headaches  without significant benefits.  Subsequently MRI of the brain was done which was concerning for pseudotumor cerebri which was confirmed by LP which showed an OP of 7mmHg.  She did not have any change in headaches following LP.  EMG of the right leg was normal.  She saw her ophthalmologist (Dr. Nehemiah Massed) who did not find any evidence of increased IOP.    - Follow-up 07/18/2013:  She had worsening headaches, despite increasing diamox so underwent second LP which showed OP 37.  Headaches resolved after LP, but quickly returned within 2-days.  Headaches were even worse than before, so diamox was increased to 1000 BID.  She continued to headaches so lasix was added and was referred to neurosurgery who did not recommend shunt due to no papilledema and vision symptoms.  About 10-days ago (soon after started lasix), headaches slowly improved to 4/10 most days, when severe 10/10 (occuring 3-times per week).  Her potassium was checked and found to be low 2.9 and she has subsequently been started on potassium 87mEq twice daily.  - UPDATE 02/11/2016:  Patient scheduled return visit for numbness of the legs.  She was last seen in April 2015 for headaches related to psuedotumor cerebri and was taking Diamox 1000mg  BID and lasix 20mg  daily.  She discontinued these medications in August 2016 during which time she had aqueous shunt placed in her right eye in August 2016 due to glaucoma.  Her headaches did not significantly worsen despite stopping her medications.  She continues to have daily dull headaches, but does not usually take anything for it.  No new vision complaints. 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  Starting around September 2017, she began having spells of right leg numbnes over the entire thigh, but sparing the lower leg.  She reports that she is falling more because her right leg feels numb with sitting or prolonged standing. She usually leans against something and then shakes her  leg, which usually resolves symptoms.  It can last anywhere from 90min - 30 minutes and occurs daily.  She also feels numbness of the right side of her jaw and arm which is sporadic occurring about 4 time in the past two weeks, this lasts a few minutes.  No associated weakness.  - UPDATE 03/02/2016:  She went to the ER on 11/25 because of worsening headaches and had LP with 13cc CSF removed. Unfortunately opening pressure was not measured.  She did not appreciate a marked change in the intensity of her pain.  The following day, she developed throbbing headache, worse when she is active and improved with laying flat.  She called this week with worsening headaches which kept her out from work and was given toradol injection, without any benefit.  Her headaches are very different from her previous headaches, because they do not seem to improve with anything.  She has been sleeping most evenings.   She does report improvement of bilateral leg paresthesias.   UPDATE 06/12/2016:  She stopped nortriptyline because of weight gain and has lost 60lb but Vyvanse and limiting calories to 1200 daily.  Her headaches are doing much better and now only has them when she is at work and stressed.  She gets these about 3-4 days per week and does not treat them with medications, and uses music to distract her.  Fortunately, she not had any interval ER visits for LP.  She continues to stumble at times and she does not have painful paresthsias, only numbness of the legs.     Medications:  Current Outpatient Prescriptions on File Prior to Visit  Medication Sig Dispense Refill  . acetaminophen (TYLENOL) 500 MG tablet Take 1,000 mg by mouth every 6 (six) hours as needed for headache. Reported on 07/06/2015    . acetaZOLAMIDE (DIAMOX) 500 MG capsule Take 1 capsule (500 mg total) by mouth 2 (two) times daily. 60 capsule 3  . Albiglutide (TANZEUM) 50 MG PEN Inject 50 mg into the skin once a week.     Marland Kitchen albuterol (PROVENTIL) (2.5  MG/3ML) 0.083% nebulizer solution Take 2.5 mg by nebulization every 6 (six) hours as needed for wheezing or shortness of breath.    . azaTHIOprine (IMURAN) 50 MG tablet Take 200 mg by mouth every evening.     . butalbital-acetaminophen-caffeine (FIORICET, ESGIC) 50-325-40 MG tablet Take 1 tablet by mouth every 6 (six) hours as needed for headache. 20 tablet 0  . Cholecalciferol (VITAMIN D3) 5000 units CAPS Take 5,000 Units by mouth every morning.     . cyclobenzaprine (FLEXERIL) 10 MG tablet Take 1 tablet (10 mg total) by mouth every 8 (eight) hours as needed for muscle spasms. 21 tablet 0  . dorzolamide-timolol (COSOPT) 22.3-6.8 MG/ML ophthalmic solution Place 1 drop into both eyes 2 (two) times daily.    Marland Kitchen levocetirizine (XYZAL) 5 MG tablet Take 5 mg by mouth 2 (two) times daily.     Marland Kitchen lisdexamfetamine (VYVANSE) 50 MG capsule Take 50 mg by mouth every morning.    Marland Kitchen lisinopril (PRINIVIL,ZESTRIL) 5 MG tablet Take 5 mg by mouth daily.    . Magnesium 250 MG TABS Take 250 mg  by mouth 2 (two) times daily.     . ondansetron (ZOFRAN ODT) 4 MG disintegrating tablet Take 1 tablet (4 mg total) by mouth every 8 (eight) hours as needed for nausea or vomiting. 20 tablet 0  . prednisoLONE acetate (PRED FORTE) 1 % ophthalmic suspension Place 1 drop into both eyes 2 (two) times daily.    . nortriptyline (PAMELOR) 10 MG capsule Start nortriptyline 10mg  at bedtime for 2 week, then increase to 2 tablet at bedtime (Patient not taking: Reported on 06/12/2016) 60 capsule 5   No current facility-administered medications on file prior to visit.     Allergies:  Allergies  Allergen Reactions  . Strawberry Extract Itching and Swelling  . Metformin And Related Itching     Review of Systems:  CONSTITUTIONAL: No fevers, chills, night sweats, +weight loss.  EYES: No visual changes or eye pain ENT: No hearing changes.  No history of nose bleeds.   RESPIRATORY: No cough, wheezing and shortness of breath.     CARDIOVASCULAR: Negative for chest pain, and palpitations.   GI: Negative for abdominal discomfort, blood in stools or black stools.  No recent change in bowel habits.   GU:  No history of incontinence.   MUSCLOSKELETAL: No history of joint pain or swelling.  No myalgias.   SKIN: Negative for lesions, rash, and itching.   ENDOCRINE: Negative for cold or heat intolerance, polydipsia or goiter.   PSYCH:  No depression or anxiety symptoms.   NEURO: As Above.   Vital Signs:  BP 110/74   Pulse 86   Ht 5\' 1"  (1.549 m)   Wt 225 lb 7 oz (102.3 kg)   SpO2 98%   BMI 42.60 kg/m   Neurological Exam: MENTAL STATUS including orientation to time, place, person is normal.  Speech is not dysarthric.  She appears uncomfortable due to pain.   CRANIAL NERVES:  Pupils equal round and reactive to light.  Normal conjugate, extra-ocular eye movements in all directions of gaze.  Face is symmetric. Palate elevates symmetrically.  Tongue is midline.  MOTOR:  Motor strength is 5/5 in all extremities.   MSRs:  Reflexes are 2+/4 throughout  COORDINATION/GAIT:  Gait is normal and steady.  Stressed and tandem gait.  Data: EMG of the left leg 05/08/2013:  This is a normal electrodiagnostic study of the right lower extremity.   MRI brain 04/24/2013:   1. Partially empty sella configuration, new since 2007. In this clinical setting consider idiopathic intracranial hypertension  (pseudotumor cerebri), although this appearance of the pituitary can be a normal anatomic variant.  2. Otherwise largely stable and normal for age MRI appearance of the brain since 2007.   CSF 05/14/2013:  OP 30   R0 W0  G53  P27, ACE neg CSF 06/24/2013:  OP 37  R1  W0  G48  P34, ACE neg  Labs 07/08/2013:  Na 140, K 3.4, Chl 107, CO2 25, Cr 1.09, BUN 17, AST 39, ALT 43, HbA1c 5.7   IMPRESSION/PLAN 1.  Pseudotumor cerebri, diagnosed 05/2013 (OP 30cm) - well-controlled and most likely due to her impressive weight loss  - Continue Diamox  500mg  twice daily.  Plan to taper, going forward.  - Avoid topiramate due to glaucoma  2.  Episodic tension headaches, exacerbated by stress  - OK to use NSAIDs and tylenol, limit to twice per week  3.  Weight loss of 60lb over the past years, praised her for this huge accomplishment.  Encouraged her to keep up  with diet and exercise plans   Return to clinic in 4 months  The duration of this appointment visit was 25 minutes of face-to-face time with the patient.  Greater than 50% of this time was spent in counseling, explanation of diagnosis, planning of further management, and coordination of care.   Thank you for allowing me to participate in patient's care.  If I can answer any additional questions, I would be pleased to do so.    Sincerely,    Oden Lindaman K. Posey Pronto, DO

## 2016-06-12 NOTE — Patient Instructions (Signed)
You look amazing!!  Keep it up.  If you continue well, we will plan to taper the diamox at your next visit  Return to clinic in 4 months.

## 2016-09-22 ENCOUNTER — Other Ambulatory Visit: Payer: Self-pay | Admitting: Neurology

## 2016-09-25 ENCOUNTER — Other Ambulatory Visit: Payer: Self-pay | Admitting: *Deleted

## 2016-09-25 MED ORDER — ACETAZOLAMIDE ER 500 MG PO CP12: 500.0000 mg | ORAL_CAPSULE | Freq: Two times a day (BID) | ORAL | 5 refills | Status: DC

## 2016-09-27 ENCOUNTER — Telehealth: Payer: Self-pay | Admitting: Neurology

## 2016-09-27 NOTE — Telephone Encounter (Signed)
Patient states that the pharmacy has been reaching out to [place] about patient medication Diamox and has not heard anything back from [place] please call patient back and let her know what is going on

## 2016-09-28 ENCOUNTER — Other Ambulatory Visit: Payer: Self-pay | Admitting: *Deleted

## 2016-09-28 MED ORDER — ACETAZOLAMIDE ER 500 MG PO CP12: 500.0000 mg | ORAL_CAPSULE | Freq: Two times a day (BID) | ORAL | 5 refills | Status: DC

## 2016-09-28 NOTE — Telephone Encounter (Signed)
Patient notified that Rx has been sent in. 

## 2016-10-07 ENCOUNTER — Encounter (HOSPITAL_COMMUNITY): Payer: Self-pay

## 2016-10-07 ENCOUNTER — Emergency Department (HOSPITAL_COMMUNITY)
Admission: EM | Admit: 2016-10-07 | Discharge: 2016-10-08 | Disposition: A | Discharge status: Home / Self Care | Payer: BC Managed Care – PPO | Attending: Emergency Medicine | Admitting: Emergency Medicine

## 2016-10-07 DIAGNOSIS — R42 Dizziness and giddiness: Secondary | ICD-10-CM | POA: Diagnosis not present

## 2016-10-07 DIAGNOSIS — J45909 Unspecified asthma, uncomplicated: Secondary | ICD-10-CM | POA: Insufficient documentation

## 2016-10-07 DIAGNOSIS — Z794 Long term (current) use of insulin: Secondary | ICD-10-CM | POA: Diagnosis not present

## 2016-10-07 DIAGNOSIS — E119 Type 2 diabetes mellitus without complications: Secondary | ICD-10-CM | POA: Insufficient documentation

## 2016-10-07 DIAGNOSIS — Z79899 Other long term (current) drug therapy: Secondary | ICD-10-CM | POA: Insufficient documentation

## 2016-10-07 LAB — CBC
HCT: 37.4 % (ref 36.0–46.0)
HEMOGLOBIN: 12.5 g/dL (ref 12.0–15.0)
MCH: 29.1 pg (ref 26.0–34.0)
MCHC: 33.4 g/dL (ref 30.0–36.0)
MCV: 87 fL (ref 78.0–100.0)
Platelets: 391 10*3/uL (ref 150–400)
RBC: 4.3 MIL/uL (ref 3.87–5.11)
RDW: 14.1 % (ref 11.5–15.5)
WBC: 11.1 10*3/uL — ABNORMAL HIGH (ref 4.0–10.5)

## 2016-10-07 LAB — BASIC METABOLIC PANEL
ANION GAP: 8 (ref 5–15)
BUN: 17 mg/dL (ref 6–20)
CHLORIDE: 108 mmol/L (ref 101–111)
CO2: 23 mmol/L (ref 22–32)
CREATININE: 1.03 mg/dL — AB (ref 0.44–1.00)
Calcium: 9.3 mg/dL (ref 8.9–10.3)
GFR calc non Af Amer: >60 mL/min (ref >60)
GLUCOSE: 87 mg/dL (ref 65–99)
Potassium: 3.4 mmol/L — ABNORMAL LOW (ref 3.5–5.1)
Sodium: 139 mmol/L (ref 135–145)

## 2016-10-07 LAB — URINALYSIS, ROUTINE W REFLEX MICROSCOPIC
Bilirubin Urine: NEGATIVE
Glucose, UA: NEGATIVE mg/dL
Hgb urine dipstick: NEGATIVE
Ketones, ur: NEGATIVE mg/dL
Nitrite: NEGATIVE
PROTEIN: NEGATIVE mg/dL
Specific Gravity, Urine: 1.021 (ref 1.005–1.030)
pH: 5 (ref 5.0–8.0)

## 2016-10-07 LAB — I-STAT BETA HCG BLOOD, ED (MC, WL, AP ONLY)

## 2016-10-07 LAB — CBG MONITORING, ED: GLUCOSE-CAPILLARY: 84 mg/dL (ref 65–99)

## 2016-10-07 LAB — RAPID STREP SCREEN (MED CTR MEBANE ONLY): STREPTOCOCCUS, GROUP A SCREEN (DIRECT): NEGATIVE

## 2016-10-07 MED ORDER — MECLIZINE HCL 25 MG PO TABS
50.0000 mg | ORAL_TABLET | Freq: Once | ORAL | Status: AC
  Administered 2016-10-07: 50 mg via ORAL
  Filled 2016-10-07: qty 2

## 2016-10-07 NOTE — ED Provider Notes (Signed)
[facility] DEPT Provider Note   CSN: [id number] Arrival date & time: 10/07/16  8250     History   Chief Complaint Chief Complaint  Patient presents with  . Dizziness    HPI Denise Cook is a 41 y.o. female.  HPI Patient presents with 2 days of spinning dizziness. Worse with turning head and changing positions. Associated with nausea. Denies headache, photophobia. No fever or chills. Patient has had some left-sided sore throat and pain with swallowing. Denies any focal weakness or numbness. She ran out of her Diamox last week in missed several doses. She's been taking Diamox for the last few days. Past Medical History:  Diagnosis Date  . Allergy   . Asthma   . Depression   . Diabetes mellitus    diet control  . Glaucoma   . Obesity   . Pseudotumor cerebri   . Sarcoidosis 2012    Patient Active Problem List   Diagnosis Date Noted  . Pseudotumor cerebri 04/21/2013  . Leg pain, bilateral 04/15/2013    Past Surgical History:  Procedure Laterality Date  . ABDOMINAL HYSTERECTOMY    . CARDIAC CATHETERIZATION    . CHOLECYSTECTOMY    . EYE SURGERY Left    Removed vitreous membrane  . glaucoma shunt    . INCISION AND DRAINAGE Right 05/01/2014   Procedure: INCISION AND DRAINAGE RIGHT INDEX FLEXOR SHEATH  ;  Surgeon: Charlotte Crumb, MD;  Location: [facility];  Service: Orthopedics;  Laterality: Right;  . left eye surgery      OB History    No data available       Home Medications    Prior to Admission medications   Medication Sig Start Date End Date Taking? Authorizing Provider  acetaminophen (TYLENOL) 500 MG tablet Take 1,000 mg by mouth every 6 (six) hours as needed for headache. Reported on 07/06/2015    [provider]  acetaZOLAMIDE (DIAMOX) 500 MG capsule Take 1 capsule (500 mg total) by mouth 2 (two) times daily. 09/28/16   Patel, Donika K, DO  Albiglutide (TANZEUM) 50 MG PEN Inject 50 mg into the skin once a week.     [provider]  albuterol (PROVENTIL) (2.5 MG/3ML) 0.083% nebulizer solution Take 2.5 mg by nebulization every 6 (six) hours as needed for wheezing or shortness of breath.    [provider]  azaTHIOprine (IMURAN) 50 MG tablet Take 200 mg by mouth every evening.     [provider]  butalbital-acetaminophen-caffeine (FIORICET, ESGIC) 50-325-40 MG tablet Take 1 tablet by mouth every 6 (six) hours as needed for headache. 03/02/16   Narda Amber K, DO  Cholecalciferol (VITAMIN D3) 5000 units CAPS Take 5,000 Units by mouth every morning.     [provider]  cyclobenzaprine (FLEXERIL) 10 MG tablet Take 1 tablet (10 mg total) by mouth every 8 (eight) hours as needed for muscle spasms. 03/01/15   Withrow, Elyse Jarvis, FNP  dorzolamide-timolol (COSOPT) 22.3-6.8 MG/ML ophthalmic solution Place 1 drop into both eyes 2 (two) times daily.    [provider]  levocetirizine (XYZAL) 5 MG tablet Take 5 mg by mouth 2 (two) times daily.     [provider]  lisdexamfetamine (VYVANSE) 50 MG capsule Take 50 mg by mouth every morning.    [provider]  lisinopril (PRINIVIL,ZESTRIL) 5 MG tablet Take 5 mg by mouth daily.    [provider]  Magnesium 250 MG TABS Take 250 mg by mouth 2 (two) times  daily.     [provider]  nortriptyline (PAMELOR) 10 MG capsule Start nortriptyline 10mg  at bedtime for 2 week, then increase to 2 tablet at bedtime Patient not taking: Reported on 06/12/2016 02/11/16   Narda Amber K, DO  ondansetron (ZOFRAN ODT) 4 MG disintegrating tablet Take 1 tablet (4 mg total) by mouth every 8 (eight) hours as needed for nausea or vomiting. 09/14/15   Hess, Tamela Oddi, DO  prednisoLONE acetate (PRED FORTE) 1 % ophthalmic suspension Place 1 drop into both eyes 2 (two) times daily.    [provider]  TRULICITY 1.5 OX/7.3ZH SOPN  05/29/16   [provider]    Family History Family History  Problem Relation Age of Onset    . Adopted: Yes  . Healthy Daughter   . Cancer Maternal Grandmother   . Alcoholism Mother   . Diabetes Paternal Grandmother     Social History Social History  Substance Use Topics  . Smoking status: Never Smoker  . Smokeless tobacco: Never Used  . Alcohol use No     Allergies   Strawberry extract and Metformin and related   Review of Systems Review of Systems  Constitutional: Negative for chills and fever.  HENT: Positive for congestion and sore throat. Negative for ear pain, sinus pain, sinus pressure and tinnitus.   Eyes: Negative for photophobia and visual disturbance.  Respiratory: Negative for cough and shortness of breath.   Cardiovascular: Negative for chest pain, palpitations and leg swelling.  Gastrointestinal: Positive for nausea. Negative for abdominal pain, constipation, diarrhea and vomiting.  Genitourinary: Negative for dysuria, flank pain and frequency.  Musculoskeletal: Negative for back pain, myalgias, neck pain and neck stiffness.  Skin: Negative for rash and wound.  Neurological: Positive for dizziness. Negative for syncope, weakness, light-headedness, numbness and headaches.  All other systems reviewed and are negative.    Physical Exam Updated Vital Signs BP 124/84   Pulse 74   Temp 97.8 F (36.6 C)   Resp 14   Ht 5\' 1"  (1.549 m)   Wt 106.1 kg (234 lb)   SpO2 100%   BMI 44.21 kg/m   Physical Exam  Constitutional: She is oriented to person, place, and time. She appears well-developed and well-nourished. No distress.  HENT:  Head: Normocephalic and atraumatic.  Mouth/Throat: Oropharynx is clear and moist. No oropharyngeal exudate.  Bilateral nasal mucosal edema. No sinus tenderness to percussion. Patient has some white patches on the left posterior oropharynx with surrounding erythema. Bilateral TMs appear to be sclerosed  Eyes: EOM are normal. Pupils are equal, round, and reactive to light.  Patient has a fatigable horizontal nystagmus.   Neck: Normal range of motion. Neck supple.  No meningismus  Cardiovascular: Normal rate and regular rhythm.  Exam reveals no gallop and no friction rub.   No murmur heard. Pulmonary/Chest: Effort normal and breath sounds normal. No respiratory distress. She has no wheezes. She has no rales. She exhibits no tenderness.  Abdominal: Soft. Bowel sounds are normal. There is no tenderness. There is no rebound and no guarding.  Musculoskeletal: Normal range of motion. She exhibits no edema or tenderness.  Lymphadenopathy:    She has cervical adenopathy.  Neurological: She is alert and oriented to person, place, and time.  Patient is alert and oriented x3 with clear, goal oriented speech. Patient has 5/5 motor in all extremities. Sensation is intact to light touch. Bilateral finger-to-nose is normal with no signs of dysmetria.   Skin: Skin is warm and  dry. No rash noted. No erythema.  Psychiatric: She has a normal mood and affect. Her behavior is normal.  Nursing note and vitals reviewed.    ED Treatments / Results  Labs (all labs ordered are listed, but only abnormal results are displayed) Labs Reviewed  BASIC METABOLIC PANEL - Abnormal; Notable for the following:       Result Value   Potassium 3.4 (*)    Creatinine, Ser 1.03 (*)    All other components within normal limits  CBC - Abnormal; Notable for the following:    WBC 11.1 (*)    All other components within normal limits  URINALYSIS, ROUTINE W REFLEX MICROSCOPIC - Abnormal; Notable for the following:    APPearance HAZY (*)    Leukocytes, UA MODERATE (*)    Bacteria, UA RARE (*)    Squamous Epithelial / LPF 0-5 (*)    All other components within normal limits  RAPID STREP SCREEN (NOT AT [facility])  CULTURE, GROUP A STREP ([facility])  CBG MONITORING, ED  I-STAT BETA HCG BLOOD, ED ([facility], [facility], [facility] ONLY)    EKG  EKG Interpretation None       Radiology No results found.  Procedures Procedures (including critical care  time)  Medications Ordered in ED Medications  meclizine (ANTIVERT) tablet 50 mg (50 mg Oral Given 10/07/16 2029)     Initial Impression / Assessment and Plan / ED Course  I have reviewed the triage vital signs and the nursing notes.  Pertinent labs & imaging results that were available during my care of the patient were reviewed by me and considered in my medical decision making (see chart for details).     Treated with meclizine with no improvement in her symptoms. Discussed with Dr. Armida Sans, neurology on-call. Given patient's complicated neurologic history, recommends MRI brain. Discussed with Dr. Jeneen Rinks who will accept the patient in transfer to [facility] emergency department for MRI. The patient is requesting travel by private vehicle. She has family members who will drive. Understands need to go immediately to the [facility] emergency department. Will likely need antibiotics for strep throat despite negative rapid strep.  Final Clinical Impressions(s) / ED Diagnoses   Final diagnoses:  Vertigo    New Prescriptions New Prescriptions   No medications on file     Julianne Rice, MD 10/07/16 2330

## 2016-10-07 NOTE — ED Notes (Signed)
Pt reports no change in dizziness after medications. Currently awaiting neurology consult.

## 2016-10-07 NOTE — ED Triage Notes (Signed)
Pt states dizziness x 2 days.  Pt has hx of same and dx with pseudotumor?.  Pt normally doubles med dosage.  Symptoms not improving.

## 2016-10-08 ENCOUNTER — Emergency Department (HOSPITAL_COMMUNITY): Payer: BC Managed Care – PPO

## 2016-10-08 MED ORDER — MECLIZINE HCL 25 MG PO TABS: 25.0000 mg | ORAL_TABLET | Freq: Three times a day (TID) | ORAL | 0 refills | Status: DC | PRN

## 2016-10-08 MED ORDER — ONDANSETRON 4 MG PO TBDP: 4.0000 mg | ORAL_TABLET | Freq: Three times a day (TID) | ORAL | 0 refills | Status: DC | PRN

## 2016-10-08 NOTE — Discharge Instructions (Signed)
As we discussed, your MRI today was negative for stroke. Can try to the meclizine and zofran to see if it helps with the dizziness.  Make sure to drink fluids to stay hydrated. Follow-up with your neurologist. Return here for any new/worsening symptoms.

## 2016-10-08 NOTE — ED Notes (Signed)
Patient transported to MRI 

## 2016-10-08 NOTE — ED Provider Notes (Signed)
12:11 AM Patient transferred here from [facility] for MRI. Reports she has been experiencing dizziness. She describes it as "feeling like she is falling off a cliff, but never reaches the bottom".  She has a history of pseudotumor cerebri, followed by Dr. Posey Pronto with neurology.  Workup thus far reassuring. MRI ordered. H&H stable at this time.  Results for orders placed or performed during the hospital encounter of 10/07/16  Rapid strep screen  Result Value Ref Range   Streptococcus, Group A Screen (Direct) NEGATIVE NEGATIVE  Basic metabolic panel  Result Value Ref Range   Sodium 139 135 - 145 mmol/L   Potassium 3.4 (L) 3.5 - 5.1 mmol/L   Chloride 108 101 - 111 mmol/L   CO2 23 22 - 32 mmol/L   Glucose, Bld 87 65 - 99 mg/dL   BUN 17 6 - 20 mg/dL   Creatinine, Ser 1.03 (H) 0.44 - 1.00 mg/dL   Calcium 9.3 8.9 - 10.3 mg/dL   GFR calc non Af Amer >60 >60 mL/min   GFR calc Af Amer >60 >60 mL/min   Anion gap 8 5 - 15  CBC  Result Value Ref Range   WBC 11.1 (H) 4.0 - 10.5 K/uL   RBC 4.30 3.87 - 5.11 MIL/uL   Hemoglobin 12.5 12.0 - 15.0 g/dL   HCT 37.4 36.0 - 46.0 %   MCV 87.0 78.0 - 100.0 fL   MCH 29.1 26.0 - 34.0 pg   MCHC 33.4 30.0 - 36.0 g/dL   RDW 14.1 11.5 - 15.5 %   Platelets 391 150 - 400 K/uL  Urinalysis, Routine w reflex microscopic  Result Value Ref Range   Color, Urine YELLOW YELLOW   APPearance HAZY (A) CLEAR   Specific Gravity, Urine 1.021 1.005 - 1.030   pH 5.0 5.0 - 8.0   Glucose, UA NEGATIVE NEGATIVE mg/dL   Hgb urine dipstick NEGATIVE NEGATIVE   Bilirubin Urine NEGATIVE NEGATIVE   Ketones, ur NEGATIVE NEGATIVE mg/dL   Protein, ur NEGATIVE NEGATIVE mg/dL   Nitrite NEGATIVE NEGATIVE   Leukocytes, UA MODERATE (A) NEGATIVE   RBC / HPF 0-5 0 - 5 RBC/hpf   WBC, UA 6-30 0 - 5 WBC/hpf   Bacteria, UA RARE (A) NONE SEEN   Squamous Epithelial / LPF 0-5 (A) NONE SEEN  CBG monitoring, ED  Result Value Ref Range   Glucose-Capillary 84 65 - 99 mg/dL  I-Stat Beta hCG  blood, ED ([facility], [facility], [facility] only)  Result Value Ref Range   I-stat hCG, quantitative <5.0 <5 mIU/mL   Comment 3           Mr Brain Wo Contrast  Result Date: 10/08/2016 CLINICAL DATA:  Initial evaluation for acute dizziness. History of pseudotumor cerebri. EXAM: MRI HEAD WITHOUT CONTRAST TECHNIQUE: Multiplanar, multiecho pulse sequences of the brain and surrounding structures were obtained without intravenous contrast. COMPARISON:  Prior MRI from 02/17/2016. FINDINGS: Brain: Cerebral volume stable, and within normal limits for age. Few scattered subcentimeter T2/FLAIR hyperintense foci noted within the subcortical white matter of the anterior frontal lobes bilaterally near the vertex, nonspecific, but stable from prior. No evidence for white matter disease progression. No abnormal foci of restricted diffusion to suggest acute or subacute ischemia. No encephalomalacia to suggest chronic infarction. No foci of susceptibility artifact to suggest acute or chronic intracranial hemorrhage. No mass lesion, midline shift or mass effect. Ventricles stable in size without evidence for hydrocephalus. Cavum et septum pellucidum noted. No extra-axial fluid collection. Major dural sinuses  are grossly patent. 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 Midline structures intact and normal. Vascular: Major intracranial vascular flow voids are well maintained. Skull and upper cervical spine: Craniocervical junction within normal limits. Visualized upper cervical spine unremarkable. Signal intensity within the visualized bone marrow is diffusely decreased on T1 weighted imaging, most commonly related to anemia, smoking, or obesity. Calvarium intact. No scalp soft tissue abnormality. Sinuses/Orbits: Globes within normal limits. Diffuse distention of the optic nerve sheaths again noted, stable from previous. Orbital soft tissues otherwise unremarkable. Paranasal sinuses are clear. No mastoid effusion. Inner ear structures within  normal limits. IMPRESSION: 1. No acute intracranial process identified. 2. 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 Findings are stable from previous. 3. Mild cerebral white matter changes involving the anterior frontal lobes near the convexity, nonspecific, but also stable from prior. Electronically Signed   By: Jeannine Boga M.D.   On: 10/08/2016 04:23    4:32 AM MRI negative.  Results discussed with patient.  Open to trial of meclizine at home as well as nausea meds.  She has FU with her neurologist later this month.  She understands to return here for any new/worsening symptoms.   Larene Pickett, PA-C 10/08/16 6314    Ezequiel Essex, MD 10/08/16 [phone]

## 2016-10-10 ENCOUNTER — Ambulatory Visit (INDEPENDENT_AMBULATORY_CARE_PROVIDER_SITE_OTHER): Payer: BC Managed Care – PPO | Admitting: Neurology

## 2016-10-10 ENCOUNTER — Encounter: Payer: Self-pay | Admitting: Neurology

## 2016-10-10 VITALS — BP 100/70 | HR 75 | Ht 61.0 in | Wt 237.1 lb

## 2016-10-10 DIAGNOSIS — G932 Benign intracranial hypertension: Secondary | ICD-10-CM

## 2016-10-10 DIAGNOSIS — H8190 Unspecified disorder of vestibular function, unspecified ear: Secondary | ICD-10-CM

## 2016-10-10 DIAGNOSIS — R42 Dizziness and giddiness: Secondary | ICD-10-CM | POA: Diagnosis not present

## 2016-10-10 LAB — CULTURE, GROUP A STREP (THRC)

## 2016-10-10 NOTE — Progress Notes (Signed)
Follow-up Visit   Date: 10/10/16    Denise Cook MRN: [id number] DOB: September 29, 1975   Interim History: Denise Cook is a 40 y.o. 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  History of present illness: Starting in the fall of 2014, she began having daily headache at the base of her head, described as throbbing ache. She has nausea, photophobia, and phonophobia. She has tried toradol injection, tramadol, hydrocodone, GON block, and exedrin migraine without any relief.  MRI of the brain was done which was concerning for pseudotumor cerebri which was confirmed by LP which showed an OP of 2mmHg. She saw her ophthalmologist (Dr. Nehemiah Massed) who did not find any evidence of increased IOP.  In the Spring of 2015, she had worsening headaches, despite increasing diamox so underwent second LP which showed OP 37.  Headaches resolved after LP, but quickly returned within 2-days.  Headaches were even worse than before, so diamox was increased to 1000 BID.  She continued to headaches so lasix was added and was referred to neurosurgery who did not recommend shunt due to no papilledema and vision symptoms.  About 10-days ago (soon after started lasix), headaches slowly improved to 4/10 most days, when severe 10/10 (occuring 3-times per week).    - UPDATE 02/11/2016:  Patient scheduled return visit for numbness of the legs. ~September 2017, she began having spells of right leg numbnes over the entire thigh, but sparing the lower leg. She usually leans against something and then shakes her leg, which usually resolves symptoms.  It can last anywhere from 81min -  30 minutes and occurs daily.  She also feels numbness of the right side of her jaw and arm which is sporadic occurring about 4 time in the past two weeks, this lasts a few minutes  She was last seen in April 2015 for headaches related to psuedotumor cerebri and was taking Diamox 1000mg  BID and lasix 20mg  daily.  She discontinued these medications in August 2016 during which time she had aqueous shunt placed in her right eye in August 2016 due to glaucoma.  Her headaches did not significantly worsen despite stopping her medications.   - UPDATE 03/02/2016:  She went to the ER on 11/25 because of worsening headaches and had LP with 13cc CSF removed. Unfortunately opening pressure was not measured.  She did not appreciate a marked change in the intensity of her pain.  The following day, she developed throbbing headache, worse when she is active and improved with laying flat.  She called this week with worsening headaches which kept her out from work and was given toradol injection, without any benefit.  UPDATE 06/12/2016:  She stopped nortriptyline because of weight gain and has lost 60lb but Vyvanse and limiting calories to 1200 daily.  Her headaches are doing much better and now only has them when she is at work and stressed.  She gets these about 3-4 days per week and does not treat them with medications, and uses music to distract her.     UPDATE 10/10/2016:  She went to the ER on 7/7 because of dizziness and was started on vertigo.  She has sensation of imbalance and "  falling" which is worse with abrupt head movements.  She did not appreciate any improvement with meclizine which makes her sedated.  Strep test was negative. Yesterday, she went to walk-in clinic because of sore throat and was told she has fluid in the left ear and prescribed prednisone one week course, which she will start today.  Her headaches are well-controlled and she denies any new vision changes.    Medications:  Current Outpatient  Prescriptions on File Prior to Visit  Medication Sig Dispense Refill  . acetaminophen (TYLENOL) 500 MG tablet Take 1,000 mg by mouth every 6 (six) hours as needed for headache. Reported on 07/06/2015    . acetaZOLAMIDE (DIAMOX) 500 MG capsule Take 1 capsule (500 mg total) by mouth 2 (two) times daily. 60 capsule 5  . Albiglutide (TANZEUM) 50 MG PEN Inject 50 mg into the skin once a week.     Marland Kitchen albuterol (PROVENTIL) (2.5 MG/3ML) 0.083% nebulizer solution Take 2.5 mg by nebulization every 6 (six) hours as needed for wheezing or shortness of breath.    . azaTHIOprine (IMURAN) 50 MG tablet Take 200 mg by mouth every evening.     . butalbital-acetaminophen-caffeine (FIORICET, ESGIC) 50-325-40 MG tablet Take 1 tablet by mouth every 6 (six) hours as needed for headache. 20 tablet 0  . Cholecalciferol (VITAMIN D3) 5000 units CAPS Take 5,000 Units by mouth every morning.     . cyclobenzaprine (FLEXERIL) 10 MG tablet Take 1 tablet (10 mg total) by mouth every 8 (eight) hours as needed for muscle spasms. 21 tablet 0  . dorzolamide-timolol (COSOPT) 22.3-6.8 MG/ML ophthalmic solution Place 1 drop into both eyes 2 (two) times daily.    Marland Kitchen levocetirizine (XYZAL) 5 MG tablet Take 5 mg by mouth 2 (two) times daily.     Marland Kitchen lisdexamfetamine (VYVANSE) 50 MG capsule Take 50 mg by mouth every morning.    Marland Kitchen lisinopril (PRINIVIL,ZESTRIL) 5 MG tablet Take 5 mg by mouth daily.    . Magnesium 250 MG TABS Take 250 mg by mouth 2 (two) times daily.     . meclizine (ANTIVERT) 25 MG tablet Take 1 tablet (25 mg total) by mouth 3 (three) times daily as needed for dizziness. 30 tablet 0  . nortriptyline (PAMELOR) 10 MG capsule Start nortriptyline 10mg  at bedtime for 2 week, then increase to 2 tablet at bedtime 60 capsule 5  . ondansetron (ZOFRAN ODT) 4 MG disintegrating tablet Take 1 tablet (4 mg total) by mouth every 8 (eight) hours as needed for nausea. 12 tablet 0  . prednisoLONE acetate (PRED FORTE) 1 % ophthalmic suspension Place 1  drop into both eyes 2 (two) times daily.    . TRULICITY 1.5 XL/2.4MW SOPN      No current facility-administered medications on file prior to visit.     Allergies:  Allergies  Allergen Reactions  . Strawberry Extract Itching and Swelling  . [place]per Swelling  . Metformin And Related Itching     Review of Systems:  CONSTITUTIONAL: No fevers, chills, night sweats, +weight loss.  EYES: No visual changes or eye pain ENT: No hearing changes.  No history of nose bleeds.   RESPIRATORY: No cough, wheezing and shortness of breath.   CARDIOVASCULAR: Negative for chest pain, and palpitations.   GI: Negative for abdominal discomfort, blood in stools or black stools.  No recent change in bowel habits.   GU:  No history of incontinence.   MUSCLOSKELETAL: No history of joint pain or swelling.  No myalgias.  SKIN: Negative for lesions, rash, and itching.   ENDOCRINE: Negative for cold or heat intolerance, polydipsia or goiter.   PSYCH:  No depression or anxiety symptoms.   NEURO: As Above.   Vital Signs:  BP 100/70   Pulse 75   Ht 5\' 1"  (1.549 m)   Wt 237 lb 2 oz (107.6 kg)   SpO2 99%   BMI 44.80 kg/m   Neurological Exam: MENTAL STATUS including orientation to time, place, person is normal.  Speech is not dysarthric.    CRANIAL NERVES:  ?trace disc edema on the left only. Pupils equal round and reactive to light. Iridocyclitis on left.  Normal conjugate, extra-ocular eye movements in all directions of gaze.  End-gaze nystagmus bilaterally.  Face is symmetric. Palate elevates symmetrically.  Tongue is midline.  MOTOR:  Motor strength is 5/5 in all extremities.   MSRs:  Reflexes are 2+/4 throughout  COORDINATION/GAIT:  Finger to nose testing is normal. Gait is normal and steady.   Data: EMG of the left leg 05/08/2013:  This is a normal electrodiagnostic study of the right lower extremity.   MRI brain 04/24/2013:   1. Partially empty sella configuration, new since 2007. In this clinical  setting consider idiopathic intracranial hypertension  (pseudotumor cerebri), although this appearance of the pituitary can be a normal anatomic variant.  2. Otherwise largely stable and normal for age MRI appearance of the brain since 2007.   MRI brain 10/08/2016: 1. No acute intracranial process identified. 2. 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 Findings are stable from previous. 3. Mild cerebral white matter changes involving the anterior frontal lobes near the convexity, nonspecific, but also stable from prior.   CSF 05/14/2013:  OP 30   R0 W0  G53  P27, ACE neg CSF 06/24/2013:  OP 37  R1  W0  G48  P34, ACE neg  Labs 07/08/2013:  Na 140, K 3.4, Chl 107, CO2 25, Cr 1.09, BUN 17, AST 39, ALT 43, HbA1c 5.7   IMPRESSION/PLAN 1.  Pseudotumor cerebri, diagnosed 05/2013 (OP 30cm) - well-controlled   - Continue Diamox 500mg  twice daily.    - Avoid topiramate due to glaucoma  - She is scheduled to have eye exam later this week  2.  Vestibular neuritis? Causing ear fullness and dizziness, started on prednisone by primary care  3.  Episodic tension headaches - markedly improved, triggered by stress  - OK to use NSAIDs and tylenol, limit to twice per week  4.  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  History of sarcoidosis   Return to clinic in 6 months  The duration of this appointment visit was 25 minutes of face-to-face time with the patient.  Greater than 50% of this time was spent in counseling, explanation of diagnosis, planning of further management, and coordination of care.   Thank you for allowing me to participate in patient's care.  If I can answer any additional questions, I would be pleased to do so.    Sincerely,    Arloa Prak K. Posey Pronto, DO

## 2016-10-10 NOTE — Patient Instructions (Addendum)
Continue your medications as you are taking  Return to clinic in 6 months 

## 2016-10-13 ENCOUNTER — Ambulatory Visit: Payer: BC Managed Care – PPO | Admitting: Neurology

## 2016-10-30 ENCOUNTER — Ambulatory Visit: Payer: BC Managed Care – PPO | Admitting: Neurology

## 2017-04-02 ENCOUNTER — Other Ambulatory Visit: Payer: Self-pay | Admitting: Neurology

## 2017-04-13 ENCOUNTER — Ambulatory Visit: Payer: BC Managed Care – PPO | Admitting: Neurology

## 2017-04-13 ENCOUNTER — Encounter: Payer: Self-pay | Admitting: Neurology

## 2017-04-13 VITALS — BP 110/70 | HR 86 | Ht 61.0 in | Wt 242.2 lb

## 2017-04-13 DIAGNOSIS — M5481 Occipital neuralgia: Secondary | ICD-10-CM | POA: Diagnosis not present

## 2017-04-13 DIAGNOSIS — G932 Benign intracranial hypertension: Secondary | ICD-10-CM

## 2017-04-13 DIAGNOSIS — R292 Abnormal reflex: Secondary | ICD-10-CM | POA: Diagnosis not present

## 2017-04-13 DIAGNOSIS — G44229 Chronic tension-type headache, not intractable: Secondary | ICD-10-CM

## 2017-04-13 MED ORDER — ACETAZOLAMIDE ER 500 MG PO CP12: 500.0000 mg | ORAL_CAPSULE | Freq: Two times a day (BID) | ORAL | 3 refills | Status: DC

## 2017-04-13 MED ORDER — CYCLOBENZAPRINE HCL 5 MG PO TABS: 5.0000 mg | ORAL_TABLET | Freq: Every evening | ORAL | 3 refills | Status: DC | PRN

## 2017-04-13 NOTE — Progress Notes (Signed)
Follow-up Visit   Date: 04/13/17    Denise Cook MRN: [id number] DOB: 11/22/1975   Interim History: Denise Cook is a 42 y.o. 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  History of present illness: Starting in the fall of 2014, she began having daily headache at the base of her head, described as throbbing ache. She has nausea, photophobia, and phonophobia. She has tried toradol injection, tramadol, hydrocodone, GON block, and exedrin migraine without any relief.  MRI of the brain was done which was concerning for pseudotumor cerebri which was confirmed by LP which showed an OP of 13mmHg. She saw her ophthalmologist (Dr. Nehemiah Massed) who did not find any evidence of increased IOP.  In the Spring of 2015, she had worsening headaches, despite increasing diamox so underwent second LP which showed OP 37.  Headaches resolved after LP, but quickly returned within 2-days.  Headaches were even worse than before, so diamox was increased to 1000 BID.  She continued to headaches so lasix was added and was referred to neurosurgery who did not recommend shunt due to no papilledema and vision symptoms.  About 10-days ago (soon after started lasix), headaches slowly improved to 4/10 most days, when severe 10/10 (occuring 3-times per week).    She was last seen in April 2015 for headaches related to psuedotumor cerebri and was taking Diamox 1000mg  BID and lasix 20mg  daily.  She discontinued these medications in August 2016 during which time she had aqueous shunt placed in her right eye in August 2016 due to glaucoma.  Her headaches did not significantly worsen despite  stopping her medications.   - UPDATE 03/02/2016:  She went to the ER on 11/25 because of worsening headaches and had LP with 13cc CSF removed. Unfortunately opening pressure was not measured.  She did not appreciate a marked change in the intensity of her pain.  The following day, she developed throbbing headache, worse when she is active and improved with laying flat.  She called this week with worsening headaches which kept her out from work and was given toradol injection, without any benefit.  UPDATE 06/12/2016:  She stopped nortriptyline because of weight gain and has lost 60lb but Vyvanse and limiting calories to 1200 daily.  Her headaches are doing much better and now only has them when she is at work and stressed.  She gets these about 3-4 days per week and does not treat them with medications, and uses music to distract her.     UPDATE 04/13/2017:  She is here for 6 month follow-up visit.  Over the past 1 month, she has developed new daily headaches which she attributes to social stressors (mother is living with her, she is legal guardian for her brother with mental illness).  She has also noticed new sharp pain at the base of her head on the left which wakes her up from sleeping and lasts about 10-minutes.  Pain does radiate some into the neck.   She has suffered 2 unprovoked falls while walking on level surface.  There was no preceding pain, paresthesias, or weakness.  She was easily able to stand up again, but does not know what caused her to  falls.  She was recently started on Cymbalta 30mg  by her rheumatologist for generalized pain, what sounds like fibromyalgia.  She is followed closely by her ophthalmologist at [facility] for panuveitis with last visit in 02/2017.    Medications:  Current Outpatient Medications on File Prior to Visit  Medication Sig Dispense Refill  . acetaminophen (TYLENOL) 500 MG tablet Take 1,000 mg by mouth every 6 (six) hours as needed for headache. Reported on 07/06/2015    .  albuterol (PROVENTIL) (2.5 MG/3ML) 0.083% nebulizer solution Take 2.5 mg by nebulization every 6 (six) hours as needed for wheezing or shortness of breath.    . azaTHIOprine (IMURAN) 50 MG tablet Take 200 mg by mouth every evening.     . Cholecalciferol (VITAMIN D3) 5000 units CAPS Take 5,000 Units by mouth every morning.     . dorzolamide-timolol (COSOPT) 22.3-6.8 MG/ML ophthalmic solution Place 1 drop into both eyes 2 (two) times daily.    . DULoxetine (CYMBALTA) 30 MG capsule Take by mouth.    . levocetirizine (XYZAL) 5 MG tablet Take 5 mg by mouth 2 (two) times daily.     Marland Kitchen lisdexamfetamine (VYVANSE) 50 MG capsule Take 50 mg by mouth every morning.    Marland Kitchen lisinopril (PRINIVIL,ZESTRIL) 5 MG tablet Take 5 mg by mouth daily.    . Magnesium 250 MG TABS Take 250 mg by mouth 2 (two) times daily.     . ondansetron (ZOFRAN ODT) 4 MG disintegrating tablet Take 1 tablet (4 mg total) by mouth every 8 (eight) hours as needed for nausea. 12 tablet 0  . prednisoLONE acetate (PRED FORTE) 1 % ophthalmic suspension Place 1 drop into both eyes 2 (two) times daily.    . TRULICITY 1.5 ZH/0.8MV SOPN      No current facility-administered medications on file prior to visit.     Allergies:  Allergies  Allergen Reactions  . Strawberry Extract Itching and Swelling  . Piper Swelling  . Metformin And Related Itching     Review of Systems:  CONSTITUTIONAL: No fevers, chills, night sweats, weight loss.  EYES: +visual changes or eye pain ENT: No hearing changes.  No history of nose bleeds.   RESPIRATORY: No cough, wheezing and shortness of breath.   CARDIOVASCULAR: Negative for chest pain, and palpitations.   GI: Negative for abdominal discomfort, blood in stools or black stools.  No recent change in bowel habits.   GU:  No history of incontinence.   MUSCLOSKELETAL: +history of joint pain or swelling.  +myalgias.   SKIN: Negative for lesions, rash, and itching.   ENDOCRINE: Negative for cold or heat  intolerance, polydipsia or goiter.   PSYCH:  No depression or anxiety symptoms.   NEURO: As Above.   Vital Signs:  BP 110/70   Pulse 86   Ht 5\' 1"  (1.549 m)   Wt 242 lb 4 oz (109.9 kg)   SpO2 98%   BMI 45.77 kg/m   General:  Well appearing, comfortable CV:  Regular rate and rhythm Pulm:  Clear to ausculatation  Neurological Exam: MENTAL STATUS including orientation to time, place, person is normal.  Speech is not dysarthric.    CRANIAL NERVES:  No evidence of papilledema today. Pupils equal round and reactive to light. Iridocyclitis on left.  Normal conjugate, extra-ocular eye movements in all directions of gaze.   Face is symmetric. Palate elevates symmetrically.  Tongue is midline.  There is no tenderness over the left greater occipital nerve region.  MOTOR:  Motor strength  is 5/5 in all extremities.   MSRs:  Reflexes are 2+/4 throughout, except 3+/4 at the patella  SENSORY:  Vibration intact throughout  COORDINATION/GAIT:  Finger to nose testing is normal. Gait is normal, unassisted    Data: EMG of the left leg 05/08/2013:  This is a normal electrodiagnostic study of the right lower extremity.   MRI brain 04/24/2013:   1. Partially empty sella configuration, new since 2007. In this clinical setting consider idiopathic intracranial hypertension  (pseudotumor cerebri), although this appearance of the pituitary can be a normal anatomic variant.  2. Otherwise largely stable and normal for age MRI appearance of the brain since 2007.   MRI brain 10/08/2016: 1. No acute intracranial process identified. 2. 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 Findings are stable from previous. 3. Mild cerebral white matter changes involving the anterior frontal lobes near the convexity, nonspecific, but also stable from prior.   CSF 05/14/2013:  OP 30   R0 W0  G53  P27,  ACE neg CSF 06/24/2013:  OP 37  R1  W0  G48  P34, ACE neg  Labs 07/08/2013:  Na 140, K 3.4, Chl 107, CO2 25, Cr 1.09, BUN 17, AST 39, ALT 43, HbA1c 5.7 Labs 02/2017:  Na 141  K 3.6  Chl 108  Cr 1.1*     IMPRESSION/PLAN 1.  Pseudotumor cerebri, diagnosed 05/2013 (OP 30cm) - stable with no interval high volume taps  - Continue Diamox 500mg  twice daily.    - Avoid topiramate due to glaucoma  - Weight loss recommended with diet and exercise  2.  Chronic episodic tension headaches - worsening by recent social stressors  - she does not wish to be on a preventative medication at this time and is hoping that once her social stressors improve, headaches will also   - if not, start nortriptyline 10mg  at bedtime for 2 week, then increase to 2 tablet at bedtime  3.  Left greater occipital neuralgia  - Start flexeril 5mg  at bedtime  - Recommend starting neck stretching exercises   - Consider GON block, if needed  4.  Episodic leg weakness and numbness in the setting of brisk reflexes, possibly indicating neurogenic claudication.    - Patient would like to see how symptoms evolve prior to imaging of the lumbar spine  5.  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  History of sarcoidosis   Return to clinic in 6 months  Greater than 50% of this 25 minute visit was spent in counseling, explanation of diagnosis, planning of further management, and coordination of care.   Thank you for allowing me to participate in patient's care.  If I can answer any additional questions, I would be pleased to do so.    Sincerely,    Ramell Wacha K. Posey Pronto, DO

## 2017-04-13 NOTE — Patient Instructions (Addendum)
1.  Start flexeril 5mg  at bedtime  2.  Continue diamox 500mg  twice daily 3.  If headaches get worse, call my office and we can start nortriptyline 4.  If you keep falling or develop worsening numbness of the legs, call my office to schedule MRI lumbar spine  Return to clinic in 6 months

## 2017-05-04 DIAGNOSIS — D1722 Benign lipomatous neoplasm of skin and subcutaneous tissue of left arm: Secondary | ICD-10-CM | POA: Insufficient documentation

## 2017-06-22 ENCOUNTER — Other Ambulatory Visit: Payer: Self-pay | Admitting: *Deleted

## 2017-06-22 ENCOUNTER — Telehealth: Payer: Self-pay | Admitting: Neurology

## 2017-06-22 DIAGNOSIS — R209 Unspecified disturbances of skin sensation: Secondary | ICD-10-CM

## 2017-06-22 DIAGNOSIS — G932 Benign intracranial hypertension: Secondary | ICD-10-CM

## 2017-06-22 DIAGNOSIS — R202 Paresthesia of skin: Secondary | ICD-10-CM

## 2017-06-22 DIAGNOSIS — H8190 Unspecified disorder of vestibular function, unspecified ear: Secondary | ICD-10-CM

## 2017-06-22 DIAGNOSIS — M5481 Occipital neuralgia: Secondary | ICD-10-CM

## 2017-06-22 DIAGNOSIS — R42 Dizziness and giddiness: Secondary | ICD-10-CM

## 2017-06-22 DIAGNOSIS — G44229 Chronic tension-type headache, not intractable: Secondary | ICD-10-CM

## 2017-06-22 NOTE — Telephone Encounter (Signed)
Please order MRI brain wwo contrast, if she does not want to go to the ER.

## 2017-06-22 NOTE — Telephone Encounter (Signed)
Patient informed and MRI ordered.

## 2017-06-22 NOTE — Progress Notes (Unsigned)
mri

## 2017-06-22 NOTE — Telephone Encounter (Signed)
Pt called and said her dizziness is really bad, numbness both sides and trouble with her speech, please call

## 2017-06-22 NOTE — Telephone Encounter (Signed)
I spoke with patient and she has been having dizziness for about a month now.  She is also having trouble getting her words out.  Advised her to go to ER but she does not think this is anything like a stroke since it has been going on for this long.  Should I put her on our waiting list?

## 2017-06-25 ENCOUNTER — Ambulatory Visit
Admission: RE | Admit: 2017-06-25 | Discharge: 2017-06-25 | Disposition: A | Discharge status: Home / Self Care | Payer: BC Managed Care – PPO | Source: Ambulatory Visit | Attending: Neurology | Admitting: Neurology

## 2017-06-25 DIAGNOSIS — M5481 Occipital neuralgia: Secondary | ICD-10-CM

## 2017-06-25 DIAGNOSIS — R42 Dizziness and giddiness: Secondary | ICD-10-CM

## 2017-06-25 DIAGNOSIS — R202 Paresthesia of skin: Secondary | ICD-10-CM

## 2017-06-25 DIAGNOSIS — G44229 Chronic tension-type headache, not intractable: Secondary | ICD-10-CM

## 2017-06-25 DIAGNOSIS — R209 Unspecified disturbances of skin sensation: Secondary | ICD-10-CM

## 2017-06-25 DIAGNOSIS — H8190 Unspecified disorder of vestibular function, unspecified ear: Secondary | ICD-10-CM

## 2017-06-25 DIAGNOSIS — G932 Benign intracranial hypertension: Secondary | ICD-10-CM

## 2017-06-25 MED ORDER — GADOBENATE DIMEGLUMINE 529 MG/ML IV SOLN
20.0000 mL | Freq: Once | INTRAVENOUS | Status: AC | PRN
Start: 2017-06-25 — End: 2017-06-25
  Administered 2017-06-25: 20 mL via INTRAVENOUS

## 2017-06-27 ENCOUNTER — Telehealth: Payer: Self-pay | Admitting: *Deleted

## 2017-06-27 ENCOUNTER — Telehealth: Payer: Self-pay | Admitting: Neurology

## 2017-06-27 NOTE — Telephone Encounter (Signed)
-----   Message from Alda Berthold, DO sent at 06/27/2017 12:58 PM EDT ----- Please inform patient that her MRI brain continues to show elevated pressure from her pseudotumor cerebri - if she is still headaches and dizziness, recommend large volume LP.  We can arrange this with radiology or if headaches are severe, ER would be quicker.  Thanks.

## 2017-06-27 NOTE — Telephone Encounter (Signed)
Pt called in regards the next step from MRI, pt got results from my chart and wanted to know what to do next

## 2017-06-27 NOTE — Telephone Encounter (Signed)
Patient given results and instructions.   

## 2017-06-27 NOTE — Telephone Encounter (Signed)
Patient given results and would like the LP at [place].

## 2017-06-28 ENCOUNTER — Other Ambulatory Visit: Payer: Self-pay | Admitting: *Deleted

## 2017-06-28 DIAGNOSIS — R42 Dizziness and giddiness: Secondary | ICD-10-CM

## 2017-06-28 DIAGNOSIS — R519 Headache, unspecified: Secondary | ICD-10-CM

## 2017-06-28 DIAGNOSIS — R51 Headache: Principal | ICD-10-CM

## 2017-06-28 DIAGNOSIS — G932 Benign intracranial hypertension: Secondary | ICD-10-CM

## 2017-07-05 ENCOUNTER — Ambulatory Visit
Admission: RE | Admit: 2017-07-05 | Discharge: 2017-07-05 | Disposition: A | Discharge status: Home / Self Care | Payer: BC Managed Care – PPO | Source: Ambulatory Visit | Attending: Neurology | Admitting: Neurology

## 2017-07-05 DIAGNOSIS — R51 Headache: Principal | ICD-10-CM

## 2017-07-05 DIAGNOSIS — G932 Benign intracranial hypertension: Secondary | ICD-10-CM

## 2017-07-05 DIAGNOSIS — R42 Dizziness and giddiness: Secondary | ICD-10-CM

## 2017-07-05 DIAGNOSIS — R519 Headache, unspecified: Secondary | ICD-10-CM

## 2017-07-05 NOTE — Discharge Instructions (Signed)

## 2017-07-11 ENCOUNTER — Encounter: Payer: Self-pay | Admitting: Neurology

## 2017-07-16 ENCOUNTER — Ambulatory Visit (INDEPENDENT_AMBULATORY_CARE_PROVIDER_SITE_OTHER): Payer: BC Managed Care – PPO | Admitting: Neurology

## 2017-07-16 ENCOUNTER — Other Ambulatory Visit: Payer: BC Managed Care – PPO

## 2017-07-16 ENCOUNTER — Encounter: Payer: Self-pay | Admitting: Neurology

## 2017-07-16 VITALS — BP 102/72 | HR 78 | Ht 61.5 in | Wt 246.0 lb

## 2017-07-16 DIAGNOSIS — R292 Abnormal reflex: Secondary | ICD-10-CM

## 2017-07-16 DIAGNOSIS — R29898 Other symptoms and signs involving the musculoskeletal system: Secondary | ICD-10-CM

## 2017-07-16 DIAGNOSIS — R202 Paresthesia of skin: Secondary | ICD-10-CM | POA: Diagnosis not present

## 2017-07-16 DIAGNOSIS — G932 Benign intracranial hypertension: Secondary | ICD-10-CM | POA: Diagnosis not present

## 2017-07-16 NOTE — Progress Notes (Signed)
Follow-up Visit   Date: 07/16/17    Denise Cook MRN: [id number] DOB: October 02, 1975   Interim History: Denise Cook is a 42 y.o. 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  She has been followed for pseudotumor cerebri and daily headaches.  History of present illness: Starting in the fall of 2014, she began having daily headache at the base of her head, described as throbbing ache. She has nausea, photophobia, and phonophobia. She has tried toradol injection, tramadol, hydrocodone, GON block, and exedrin migraine without any relief.  MRI of the brain was was concerning for pseudotumor cerebri which was confirmed by LP which showed an OP of 73mmHg. She saw her ophthalmologist (Dr. Nehemiah Massed) who did not find any evidence of increased IOP.  In the Spring of 2015, she had worsening headaches, despite increasing diamox so underwent second LP which showed OP 37.  Headaches were even worse than before, so diamox was increased to 1000 BID.  She continued to headaches so lasix was added and was referred to neurosurgery who did not recommend shunt due to no papilledema and vision symptoms.  About 10-days ago (soon after started lasix), headaches slowly improved to 4/10 most days, when severe 10/10 (occuring 3-times per week).    She was last seen in April 2015 for headaches related to psuedotumor cerebri and was taking Diamox 1000mg  BID and lasix 20mg  daily.  She discontinued these medications in August 2016 during which time she had aqueous shunt placed in her right eye in August 2016 due to glaucoma.  Her headaches did not significantly worsen  despite stopping her medications.   UPDATE 04/13/2017:  She is here for 6 month follow-up visit.  Over the past 1 month, she has developed new daily headaches which she attributes to social stressors (mother is living with her, she is legal guardian for her brother with mental illness).  She has also noticed new sharp pain at the base of her head on the left which wakes her up from sleeping and lasts about 10-minutes.  Pain does radiate some into the neck.   She has suffered 2 unprovoked falls while walking on level surface.  There was no preceding pain, paresthesias, or weakness.  She was easily able to stand up again, but does not know what caused her to falls.  She was recently started on Cymbalta 30mg  by her rheumatologist for generalized pain, what sounds like fibromyalgia.  She is followed closely by her ophthalmologist at [facility] for panuveitis with last visit in 02/2017.    UPDATE 07/16/2017:  She is here for acute visit for leg weakness.  For the past few weeks, she has increased heaviness of the legs with walking and standing and has started to restrict how far she walks.  She denies any pain, cramps, or low back pain. She has fallen about three times.  She had spinal tap on 4/4 and was hoping that could alleviate her symptoms, but there was no improvement.  She also complains of dizziness, described as room spinning with nausea.  Headaches are doing well and she does not take medications, she attributes the improvement to improved life stressors.   Medications:  Current Outpatient Medications on File Prior to Visit  Medication Sig Dispense Refill  . acetaminophen (TYLENOL) 500 MG tablet Take 1,000 mg by mouth every 6 (six) hours as needed for headache. Reported on 07/06/2015    . acetaZOLAMIDE (DIAMOX) 500 MG capsule Take 1 capsule (500 mg total) by mouth 2 (two) times daily. 180 capsule 3  . albuterol (PROVENTIL) (2.5 MG/3ML) 0.083% nebulizer solution Take 2.5 mg by nebulization every 6 (six) hours  as needed for wheezing or shortness of breath.    . azaTHIOprine (IMURAN) 50 MG tablet Take 200 mg by mouth every evening.     . Cholecalciferol (VITAMIN D3) 5000 units CAPS Take 5,000 Units by mouth every morning.     . dorzolamide-timolol (COSOPT) 22.3-6.8 MG/ML ophthalmic solution Place 1 drop into both eyes 2 (two) times daily.    . DULoxetine (CYMBALTA) 30 MG capsule Take by mouth.    . levocetirizine (XYZAL) 5 MG tablet Take 5 mg by mouth 2 (two) times daily.     Marland Kitchen lisdexamfetamine (VYVANSE) 50 MG capsule Take 50 mg by mouth every morning.    Marland Kitchen lisinopril (PRINIVIL,ZESTRIL) 5 MG tablet Take 5 mg by mouth daily.    . Magnesium 250 MG TABS Take 250 mg by mouth 2 (two) times daily.     . ondansetron (ZOFRAN ODT) 4 MG disintegrating tablet Take 1 tablet (4 mg total) by mouth every 8 (eight) hours as needed for nausea. 12 tablet 0  . prednisoLONE acetate (PRED FORTE) 1 % ophthalmic suspension Place 1 drop into both eyes 2 (two) times daily.    . TRULICITY 1.5 ZO/1.0RU SOPN      No current facility-administered medications on file prior to visit.     Allergies:  Allergies  Allergen Reactions  . Piper Swelling  . Strawberry Extract Itching and Swelling  . Metformin And Related Itching     Review of Systems:  CONSTITUTIONAL: No fevers, chills, night sweats, weight loss.  EYES: +visual changes or eye pain ENT: No hearing changes.  No history of nose bleeds.   RESPIRATORY: +cough, wheezing and shortness of breath.   CARDIOVASCULAR: Negative for chest pain, and palpitations.   GI: Negative for abdominal discomfort, blood in stools or black stools.  No recent change in bowel habits.   GU:  No history of incontinence.   MUSCLOSKELETAL: +history of joint pain or swelling.  +myalgias.   SKIN: Negative for lesions, rash, and itching.   ENDOCRINE: Negative for cold or heat intolerance, polydipsia or goiter.   PSYCH:  No depression or anxiety symptoms.   NEURO: As Above.   Vital Signs:    BP 102/72   Pulse 78   Ht 5' 1.5" (1.562 m)   Wt 246 lb (111.6 kg)   SpO2 98%   BMI 45.73 kg/m   General Medical Exam:   General:  Well appearing, comfortable  Eyes/ENT: see cranial nerve examination.   Neck:   No carotid bruits. Respiratory:  Clear to auscultation, good air entry bilaterally.   Cardiac:  Regular rate and rhythm, no murmur.   Ext:  No edema  Neurological Exam: MENTAL STATUS including orientation to time, place, person, recent and remote memory, attention span and concentration, language, and fund of knowledge is normal.  Speech is not dysarthric.  CRANIAL NERVES: Fundoscopic exam does not show papilledema.  No visual field defects. Pupils equal round and reactive to light.   Iridocyclitis on left.  Normal conjugate, extra-ocular eye movements in all directions of  gaze.  Mild left ptisis.  Face is symmetric. Palate elevates symmetrically.  Tongue is midline.  MOTOR:  Motor strength is 5/5 in all extremities.  No atrophy, fasciculations or abnormal movements.  No pronator drift.  Tone is normal.    MSRs:  Reflexes are 2+/4 throughout, except 3+/4 bilateral patella jerks.  Plantars are down going.  SENSORY:  Intact to vibration and pin prick throughout.  COORDINATION/GAIT:  Normal finger-to- nose-finger.  Intact rapid alternating movements bilaterally.  Gait narrow based and stable.     Data: EMG of the left leg 05/08/2013:  This is a normal electrodiagnostic study of the right lower extremity.   MRI brain 04/24/2013:   1. Partially empty sella configuration, new since 2007. In this clinical setting consider idiopathic intracranial hypertension  (pseudotumor cerebri), although this appearance of the pituitary can be a normal anatomic variant.  2. Otherwise largely stable and normal for age MRI appearance of the brain since 2007.   MRI brain 10/08/2016: 1. No acute intracranial process identified. 2. Distended optic nerve sheaths with associated partially empty  sella, suggesting increased intracranial pressure, and consistent with history of pseudotumor cerebri. Findings are stable from previous. 3. Mild cerebral white matter changes involving the anterior frontal lobes near the convexity, nonspecific, but also stable from prior.  MRI brain wwo contrast 06/25/2017: 1.  No acute intracranial abnormality. 2. No abnormal intracranial enhancement or dural thickening to suggest neurosarcoidosis. Mild nonspecific subcortical cerebral white matter signal changes may have progressed since 2015. 3. Chronic partially empty sella and prominence of the optic nerve root sleeves again compatible with idiopathic intracranial hypertension (pseudotumor cerebri). 4. Mild bilateral maxillary sinus inflammation with small fluid levels is new since 2018.  CSF 05/14/2013:  OP 30   R0 W0  G53  P27, ACE neg CSF 06/24/2013:  OP 37  R1  W0  G48  P34, ACE neg  Labs 07/08/2013:  Na 140, K 3.4, Chl 107, CO2 25, Cr 1.09, BUN 17, AST 39, ALT 43, HbA1c 5.7 Labs 02/2017:  Na 141  K 3.6  Chl 108  Cr 1.1*    Lab Results  Component Value Date   CREATININE 1.03 (H) 10/07/2016   BUN 17 10/07/2016   NA 139 10/07/2016   K 3.4 (L) 10/07/2016   CL 108 10/07/2016   CO2 23 10/07/2016     IMPRESSION/PLAN 1.  Bilateral leg weakness - exam with brisk reflexes indicating possible neurogenic claudication.  She has been reluctant to proceed with lumbar imaging, and is agreeable now.   - MRI lumbar spine wo contrast  - Check myasthenia gravis panel, vitamin B12  2.  BPPV - new.  Start meclizine 25mg  bid as needed  3.  Pseudotumor cerebri, diagnosed 05/2013 (OP 30cm) - stable, she had recent large volume tap on 4/4 with no significant improvement.  MRI brain wwo contrast was viewed and agree with findings.   - Continue Diamox 500mg  twice daily.    - Avoid topiramate due to glaucoma  - Check CMP  - Weight loss recommended with diet and exercise  3.  Chronic episodic tension headaches -  stable. She does not wish to be on a preventative medication at this time.  She has been responsive to nortriptyline in the past, if they get worse.  4. 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  History of sarcoidosis   Return  to clinic in 4 months   Thank you for allowing me to participate in patient's care.  If I can answer any additional questions, I would be pleased to do so.    Sincerely,    Donika K. Posey Pronto, DO

## 2017-07-16 NOTE — Patient Instructions (Addendum)
MRI lumbar spine without contrast. We have sent a referral to [facility] for your MRI and they will call you directly to schedule your appt. They are located at [place]. If you need to contact them directly please call [phone].   Check labs. Your provider has requested that you have labwork completed today. Please go to [facility] ([id number]) on the second floor of this building before leaving the office today. You do not need to check in. If you are not called within 15 minutes please check with the front desk.    We will call you with the results

## 2017-07-18 ENCOUNTER — Encounter: Payer: Self-pay | Admitting: Neurology

## 2017-07-22 LAB — COMPREHENSIVE METABOLIC PANEL
AG Ratio: 1.4 (calc) (ref 1.0–2.5)
ALKALINE PHOSPHATASE (APISO): 75 U/L (ref 33–115)
ALT: 11 U/L (ref 6–29)
AST: 15 U/L (ref 10–30)
Albumin: 4.3 g/dL (ref 3.6–5.1)
BUN: 12 mg/dL (ref 7–25)
CO2: 20 mmol/L (ref 20–32)
CREATININE: 1.03 mg/dL (ref 0.50–1.10)
Calcium: 9.8 mg/dL (ref 8.6–10.2)
Chloride: 107 mmol/L (ref 98–110)
Globulin: 3.1 g/dL (calc) (ref 1.9–3.7)
Glucose, Bld: 89 mg/dL (ref 65–99)
POTASSIUM: 4.2 mmol/L (ref 3.5–5.3)
Sodium: 141 mmol/L (ref 135–146)
Total Bilirubin: 0.2 mg/dL (ref 0.2–1.2)
Total Protein: 7.4 g/dL (ref 6.1–8.1)

## 2017-07-22 LAB — MYASTHENIA GRAVIS PANEL 2: Acetylchol Modul Ab: 5 % Inhibition

## 2017-07-22 LAB — VITAMIN B12: VITAMIN B 12: 324 pg/mL (ref 200–1100)

## 2017-07-23 ENCOUNTER — Telehealth: Payer: Self-pay | Admitting: Neurology

## 2017-07-23 NOTE — Telephone Encounter (Signed)
Patient has not heard anything from the MRI place and wants to know the status of the referral

## 2017-07-23 NOTE — Telephone Encounter (Signed)
Called [facility] and they will call patient now to schedule the MRI.

## 2017-08-02 ENCOUNTER — Ambulatory Visit
Admission: RE | Admit: 2017-08-02 | Discharge: 2017-08-02 | Disposition: A | Discharge status: Home / Self Care | Payer: BC Managed Care – PPO | Source: Ambulatory Visit | Attending: Neurology | Admitting: Neurology

## 2017-08-02 DIAGNOSIS — R29898 Other symptoms and signs involving the musculoskeletal system: Secondary | ICD-10-CM

## 2017-08-02 DIAGNOSIS — R292 Abnormal reflex: Secondary | ICD-10-CM

## 2017-08-02 DIAGNOSIS — R202 Paresthesia of skin: Secondary | ICD-10-CM

## 2017-08-03 ENCOUNTER — Encounter: Payer: Self-pay | Admitting: *Deleted

## 2017-08-03 ENCOUNTER — Telehealth: Payer: Self-pay | Admitting: *Deleted

## 2017-08-03 ENCOUNTER — Other Ambulatory Visit: Payer: Self-pay | Admitting: *Deleted

## 2017-08-03 DIAGNOSIS — R292 Abnormal reflex: Secondary | ICD-10-CM

## 2017-08-03 DIAGNOSIS — R202 Paresthesia of skin: Secondary | ICD-10-CM

## 2017-08-03 DIAGNOSIS — R29898 Other symptoms and signs involving the musculoskeletal system: Secondary | ICD-10-CM

## 2017-08-03 NOTE — Telephone Encounter (Signed)
-----   Message from Alda Berthold, DO sent at 08/03/2017 11:34 AM EDT ----- Please inform patient that her MRI lumbar spine is normal.

## 2017-08-03 NOTE — Telephone Encounter (Signed)
Results sent via My Chart.  

## 2017-08-08 ENCOUNTER — Encounter: Payer: Self-pay | Admitting: *Deleted

## 2017-08-09 ENCOUNTER — Other Ambulatory Visit: Payer: Self-pay | Admitting: *Deleted

## 2017-08-09 DIAGNOSIS — R200 Anesthesia of skin: Secondary | ICD-10-CM

## 2017-08-09 DIAGNOSIS — G932 Benign intracranial hypertension: Secondary | ICD-10-CM

## 2017-08-09 DIAGNOSIS — R202 Paresthesia of skin: Secondary | ICD-10-CM

## 2017-08-09 DIAGNOSIS — R292 Abnormal reflex: Secondary | ICD-10-CM

## 2017-08-19 ENCOUNTER — Other Ambulatory Visit: Payer: BC Managed Care – PPO

## 2017-08-19 ENCOUNTER — Ambulatory Visit
Admission: RE | Admit: 2017-08-19 | Discharge: 2017-08-19 | Disposition: A | Discharge status: Home / Self Care | Payer: BC Managed Care – PPO | Source: Ambulatory Visit | Attending: Neurology | Admitting: Neurology

## 2017-08-19 DIAGNOSIS — R200 Anesthesia of skin: Secondary | ICD-10-CM

## 2017-08-19 DIAGNOSIS — G932 Benign intracranial hypertension: Secondary | ICD-10-CM

## 2017-08-19 DIAGNOSIS — R292 Abnormal reflex: Secondary | ICD-10-CM

## 2017-08-19 DIAGNOSIS — R202 Paresthesia of skin: Secondary | ICD-10-CM

## 2017-08-21 ENCOUNTER — Telehealth: Payer: Self-pay | Admitting: *Deleted

## 2017-08-21 ENCOUNTER — Encounter: Payer: Self-pay | Admitting: *Deleted

## 2017-08-21 NOTE — Telephone Encounter (Signed)
Patient given results via My Chart.  She wrote me back stating that she would like to proceed with labs and EMG.

## 2017-08-21 NOTE — Telephone Encounter (Signed)
-----   Message from Alda Berthold, DO sent at 08/20/2017  8:20 AM EDT ----- Please inform patient that her imaging of the cervical and thoracic spine looks great, nothing that is compressing any nerves.  Unfortunately, there is nothing from her spinal cord imaging that is causing her weakness.  We can check a few labs (CK, aldolase, folate) and order NCS/EMG of the legs, if she would like.

## 2017-08-23 ENCOUNTER — Encounter: Payer: BC Managed Care – PPO | Admitting: Neurology

## 2017-08-24 ENCOUNTER — Other Ambulatory Visit: Payer: Self-pay | Admitting: *Deleted

## 2017-08-24 DIAGNOSIS — R29898 Other symptoms and signs involving the musculoskeletal system: Secondary | ICD-10-CM

## 2017-08-24 DIAGNOSIS — R202 Paresthesia of skin: Secondary | ICD-10-CM

## 2017-08-28 ENCOUNTER — Ambulatory Visit (INDEPENDENT_AMBULATORY_CARE_PROVIDER_SITE_OTHER): Payer: BC Managed Care – PPO | Admitting: Neurology

## 2017-08-28 DIAGNOSIS — R202 Paresthesia of skin: Secondary | ICD-10-CM | POA: Diagnosis not present

## 2017-08-28 DIAGNOSIS — R29898 Other symptoms and signs involving the musculoskeletal system: Secondary | ICD-10-CM | POA: Diagnosis not present

## 2017-08-28 NOTE — Progress Notes (Signed)
Follow-up Visit   Date: 08/28/17    Resha Filippone MRN: [id number] DOB: [birth year]   Interim History: Terrance Lanahan is a 42 y.o. 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  She has been followed for pseudotumor cerebri and daily headaches.  History of present illness: Starting in the fall of 2014, she began having daily headache at the base of her head, described as throbbing ache. She has nausea, photophobia, and phonophobia. She has tried toradol injection, tramadol, hydrocodone, GON block, and exedrin migraine without any relief.  MRI of the brain was was concerning for pseudotumor cerebri which was confirmed by LP which showed an OP of 59mmHg. She saw her ophthalmologist (Dr. Nehemiah Massed) who did not find any evidence of increased IOP.  In the Spring of 2015, she had worsening headaches, despite increasing diamox so underwent second LP which showed OP 37.  Headaches were even worse than before, so diamox was increased to 1000 BID.  She continued to headaches so lasix was added and was referred to neurosurgery who did not recommend shunt due to no papilledema and vision symptoms.  About 10-days ago (soon after started lasix), headaches slowly improved to 4/10 most days, when severe 10/10 (occuring 3-times per week).    She was last seen in April 2015 for headaches related to psuedotumor cerebri and was taking Diamox 1000mg  BID and lasix 20mg  daily.  She discontinued these medications in August 2016 during which time she had aqueous shunt placed in her right eye in August 2016 due to glaucoma.  Her headaches did not significantly worsen despite stopping her medications.    UPDATE 07/16/2017:  She is here for acute visit for leg weakness.  For the past few weeks, she has increased heaviness of the legs with walking and standing and has started to restrict how far she walks.  She denies any pain, cramps, or low back pain. She has fallen about three times.  She had spinal tap on 4/4 and was hoping that could alleviate her symptoms, but there was no improvement.  She also complains of dizziness, described as room spinning with nausea.  Headaches are doing well and she does not take medications, she attributes the improvement to improved life stressors.   UPDATE 08/28/2017:  She is here for electrodiagnostic testing and review her prior results.  For her leg heaviness and pain, she has underwent imaging of the entire neuroaxis which does not show any nerve impingement in the cervical, thoracic, or lumbosacral regions.     Medications:  Current Outpatient Medications on File Prior to Visit  Medication Sig Dispense Refill  . acetaminophen (TYLENOL) 500 MG tablet Take 1,000 mg by mouth every 6 (six) hours as needed for headache. Reported on 07/06/2015    . acetaZOLAMIDE (DIAMOX) 500 MG capsule Take 1 capsule (500 mg total) by mouth 2 (two) times daily. 180 capsule 3  . albuterol (PROVENTIL) (2.5 MG/3ML) 0.083% nebulizer solution Take 2.5 mg by nebulization every 6 (six) hours as needed for wheezing or shortness of breath.    . azaTHIOprine (IMURAN) 50 MG tablet Take 200 mg by mouth every evening.     . Cholecalciferol (VITAMIN D3) 5000 units CAPS Take 5,000  Units by mouth every morning.     . dorzolamide-timolol (COSOPT) 22.3-6.8 MG/ML ophthalmic solution Place 1 drop into both eyes 2 (two) times daily.    . DULoxetine (CYMBALTA) 30 MG capsule Take by mouth.    . levocetirizine (XYZAL) 5 MG tablet Take 5 mg by mouth 2 (two) times daily.     Marland Kitchen lisdexamfetamine (VYVANSE) 50 MG capsule Take 50 mg by mouth every morning.    Marland Kitchen lisinopril (PRINIVIL,ZESTRIL) 5 MG tablet Take 5 mg by  mouth daily.    . Magnesium 250 MG TABS Take 250 mg by mouth 2 (two) times daily.     . ondansetron (ZOFRAN ODT) 4 MG disintegrating tablet Take 1 tablet (4 mg total) by mouth every 8 (eight) hours as needed for nausea. 12 tablet 0  . prednisoLONE acetate (PRED FORTE) 1 % ophthalmic suspension Place 1 drop into both eyes 2 (two) times daily.    . TRULICITY 1.5 JJ/0.0XF SOPN      No current facility-administered medications on file prior to visit.     Allergies:  Allergies  Allergen Reactions  . Piper Swelling  . Strawberry Extract Itching and Swelling  . Metformin And Related Itching     Exam:  deferred  Data: EMG of the left leg 05/08/2013:  This is a normal electrodiagnostic study of the right lower extremity.  CSF 05/14/2013:  OP 30   R0 W0  G53  P27, ACE neg CSF 06/24/2013:  OP 37  R1  W0  G48  P34, ACE neg  MRI brain 04/24/2013:   1. Partially empty sella configuration, new since 2007. In this clinical setting consider idiopathic intracranial hypertension  (pseudotumor cerebri), although this appearance of the pituitary can be a normal anatomic variant.  2. Otherwise largely stable and normal for age MRI appearance of the brain since 2007.   MRI brain 10/08/2016: 1. No acute intracranial process identified. 2. 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 Findings are stable from previous. 3. Mild cerebral white matter changes involving the anterior frontal lobes near the convexity, nonspecific, but also stable from prior.  MRI brain wwo contrast 06/25/2017: 1.  No acute intracranial abnormality. 2. No abnormal intracranial enhancement or dural thickening to suggest neurosarcoidosis. Mild nonspecific subcortical cerebral white matter signal changes may have progressed since 2015. 3. Chronic partially empty sella and prominence of the optic nerve root sleeves again  compatible with idiopathic intracranial hypertension (pseudotumor cerebri). 4. Mild bilateral maxillary sinus inflammation with small fluid levels is new since 2018.  MRI lumbar spine 08/02/2017:  Normal  MRI cervical and thoracic spine 08/19/2017:   1. No evidence of myelopathy. 2. Small midthoracic disc protrusions. No impingement throughout the cervicothoracic canal and foramina.  NCS/EMG of the legs 08/28/2017:  This is a normal study of the lower extremities. In particular, there is no evidence of a diffuse myopathy, sensorimotor polyneuropathy, or lumbosacral radiculopathy.   IMPRESSION/PLAN Bilateral leg weakness and pain.  She has underwent extensive neurological testing including MRI brain, cervical, thoracic, and lumbosacral spine which did not show nerve impingement.  She has chronic findings of known pseudotumor cerebri.  NCS/EMG of the legs today also is normal - no signs of myopathy or peripheral neuropathy.  Myasthenia antibodies are negative.  With all of her neurological testing returning normal, I cannot explain her symptoms by a primary neurological etiology.  If she has not been evaluated for vascular claudication,  arterial studies may be indicated, but I will defer this to her PCP.  Greater than 50% of this 15 minute visit was spent in counseling, explanation of diagnosis, planning of further management, and coordination of care.   Thank you for allowing me to participate in patient's care.  If I can answer any additional questions, I would be pleased to do so.    Sincerely,    Xochitl Egle K. Posey Pronto, DO

## 2017-08-28 NOTE — Procedures (Signed)
[facility]  [place], [place]  [place], [place] [address] Tel: [phone] Fax:  [phone] Test Date:  08/28/2017  Patient: Denise Cook DOB: 1975-05-02 Physician: Narda Amber, DO  Sex: Female Height: 5\' 1"  Ref Phys: Narda Amber, DO  ID#: [id number] Temp: 35.0C Technician:    Patient Complaints: This is a 42 year old female referred for evaluation of bilateral leg pain and weakness.  NCV & EMG Findings: Extensive electrodiagnostic testing of the right lower extremity and additional studies of the left shows:  1. Bilateral sural and superficial peroneal sensory responses are within normal limits. 2. Bilateral peroneal and tibial motor responses are within normal limits. 3. Bilateral tibial H reflex studies are within normal limits. 4. There is no evidence of active or chronic motor axon loss changes affecting any of the tested muscles. Motor unit configuration and recruitment pattern is within normal limits.  Impression: This is a normal study of the lower extremities. In particular, there is no evidence of a diffuse myopathy, sensorimotor polyneuropathy, or lumbosacral radiculopathy.   ___________________________ Narda Amber, DO    Nerve Conduction Studies Anti Sensory Summary Table   Site NR Peak (ms) Norm Peak (ms) P-T Amp (V) Norm P-T Amp  Left Sup Peroneal Anti Sensory (Ant Lat Mall)  35C  12 cm    2.3 <4.5 7.8 >5  Right Sup Peroneal Anti Sensory (Ant Lat Mall)  35C  12 cm    2.5 <4.5 10.3 >5  Left Sural Anti Sensory (Lat Mall)  35C  Calf    2.7 <4.5 20.8 >5  Right Sural Anti Sensory (Lat Mall)  35C  Calf    2.8 <4.5 20.7 >5   Motor Summary Table   Site NR Onset (ms) Norm Onset (ms) O-P Amp (mV) Norm O-P Amp Site1 Site2 Delta-0 (ms) Dist (cm) Vel (m/s) Norm Vel (m/s)  Left Peroneal Motor (Ext Dig Brev)  35C  Ankle    1.6 <5.5 5.1 >3 B Fib Ankle 7.8 34.0 44 >40  B Fib    9.4  4.4  Poplt B Fib 1.1 8.0 73 >40  Poplt    10.5  4.2          Right Peroneal Motor (Ext Dig Brev)  35C  Ankle    3.0 <5.5 6.0 >3 B Fib Ankle 6.5 36.0 55 >40  B Fib    9.5  5.6  Poplt B Fib 1.1 8.0 73 >40  Poplt    10.6  5.5         Left Peroneal TA Motor (Tib Ant)  35C  Fib Head    1.5 <4.0 8.5 >4 Poplit Fib Head 1.2 8.0 67 >40  Poplit    2.7  7.5         Right Peroneal TA Motor (Tib Ant)  35C  Fib Head    1.9 <4.0 8.3 >4 Poplit Fib Head 1.3 8.0 62 >40  Poplit    3.2  8.3         Left Tibial Motor (Abd Hall Brev)  35C  Ankle    3.8 <6.0 9.8 >8 Knee Ankle 6.5 36.0 55 >40  Knee    10.3  7.3         Right Tibial Motor (Abd Hall Brev)  35C  Ankle    2.7 <6.0 10.3 >8 Knee Ankle 7.2 35.0 49 >40  Knee    9.9  8.7          H Reflex Studies  NR H-Lat (ms) Lat Norm (ms) L-R H-Lat (ms)  Left Tibial (Gastroc)  35C     26.53 <35 0.00  Right Tibial (Gastroc)  35C     26.53 <35 0.00   EMG   Side Muscle Ins Act Fibs Psw Fasc Number Recrt Dur Dur. Amp Amp. Poly Poly. Comment  Right AntTibialis Nml Nml Nml Nml Nml Nml Nml Nml Nml Nml Nml Nml N/A  Right Gastroc Nml Nml Nml Nml Nml Nml Nml Nml Nml Nml Nml Nml N/A  Right Flex Dig Long Nml Nml Nml Nml Nml Nml Nml Nml Nml Nml Nml Nml N/A  Right RectFemoris Nml Nml Nml Nml Nml Nml Nml Nml Nml Nml Nml Nml N/A  Right GluteusMed Nml Nml Nml Nml Nml Nml Nml Nml Nml Nml Nml Nml N/A  Left AntTibialis Nml Nml Nml Nml Nml Nml Nml Nml Nml Nml Nml Nml N/A  Left Gastroc Nml Nml Nml Nml Nml Nml Nml Nml Nml Nml Nml Nml N/A  Left Flex Dig Long Nml Nml Nml Nml Nml Nml Nml Nml Nml Nml Nml Nml N/A  Left RectFemoris Nml Nml Nml Nml Nml Nml Nml Nml Nml Nml Nml Nml N/A  Left GluteusMed Nml Nml Nml Nml Nml Nml Nml Nml Nml Nml Nml Nml N/A      Waveforms:

## 2017-09-26 ENCOUNTER — Encounter: Payer: Self-pay | Admitting: Neurology

## 2017-09-27 ENCOUNTER — Emergency Department (HOSPITAL_COMMUNITY): Payer: No Typology Code available for payment source

## 2017-09-27 ENCOUNTER — Emergency Department (HOSPITAL_COMMUNITY)
Admission: EM | Admit: 2017-09-27 | Discharge: 2017-09-27 | Disposition: A | Discharge status: Home / Self Care | Payer: No Typology Code available for payment source | Attending: Emergency Medicine | Admitting: Emergency Medicine

## 2017-09-27 ENCOUNTER — Encounter (HOSPITAL_COMMUNITY): Payer: Self-pay | Admitting: *Deleted

## 2017-09-27 DIAGNOSIS — E119 Type 2 diabetes mellitus without complications: Secondary | ICD-10-CM | POA: Insufficient documentation

## 2017-09-27 DIAGNOSIS — Y939 Activity, unspecified: Secondary | ICD-10-CM | POA: Diagnosis not present

## 2017-09-27 DIAGNOSIS — Z79899 Other long term (current) drug therapy: Secondary | ICD-10-CM | POA: Diagnosis not present

## 2017-09-27 DIAGNOSIS — S0990XA Unspecified injury of head, initial encounter: Secondary | ICD-10-CM | POA: Diagnosis not present

## 2017-09-27 DIAGNOSIS — Y999 Unspecified external cause status: Secondary | ICD-10-CM | POA: Insufficient documentation

## 2017-09-27 DIAGNOSIS — Y929 Unspecified place or not applicable: Secondary | ICD-10-CM | POA: Insufficient documentation

## 2017-09-27 DIAGNOSIS — W19XXXA Unspecified fall, initial encounter: Secondary | ICD-10-CM | POA: Diagnosis not present

## 2017-09-27 NOTE — ED Provider Notes (Signed)
[facility] DEPT Provider Note   CSN: [id number] Arrival date & time: 09/27/17  1844     History   Chief Complaint Chief Complaint  Patient presents with  . Head Injury    HPI Denise Cook is a 42 y.o. female.  The history is provided by the patient.  Head Injury   The incident occurred 2 days ago. She came to the ER via walk-in. The injury mechanism was a fall. There was no loss of consciousness. The volume of blood lost was minimal. The pain is mild. The pain has been constant since the injury. She has tried nothing for the symptoms. The treatment provided no relief.  Pt fell 2 days ago.  Pt reports feeling dizzy and nauseated.  Pt was sent here by Urgent care for a ct of her head.  Pt has a cut above her left eye.  Pt had steristrips after injury   Past Medical History:  Diagnosis Date  . Allergy   . Asthma   . Depression   . Diabetes mellitus    diet control  . Glaucoma   . Obesity   . Pseudotumor cerebri   . Sarcoidosis 2012    Patient Active Problem List   Diagnosis Date Noted  . Pseudotumor cerebri 04/21/2013  . Leg pain, bilateral 04/15/2013    Past Surgical History:  Procedure Laterality Date  . ABDOMINAL HYSTERECTOMY    . CARDIAC CATHETERIZATION    . CHOLECYSTECTOMY    . EYE SURGERY Left    Removed vitreous membrane  . glaucoma shunt    . INCISION AND DRAINAGE Right 05/01/2014   Procedure: INCISION AND DRAINAGE RIGHT INDEX FLEXOR SHEATH  ;  Surgeon: Charlotte Crumb, MD;  Location: [facility];  Service: Orthopedics;  Laterality: Right;  . left eye surgery       OB History   None      Home Medications    Prior to Admission medications   Medication Sig Start Date End Date Taking? Authorizing Provider  acetaminophen (TYLENOL) 500 MG tablet Take 1,000 mg by mouth every 6 (six) hours as needed for headache. Reported on 07/06/2015    [provider]  acetaZOLAMIDE (DIAMOX) 500 MG capsule  Take 1 capsule (500 mg total) by mouth 2 (two) times daily. 04/13/17   Patel, Arvin Collard K, DO  albuterol (PROVENTIL) (2.5 MG/3ML) 0.083% nebulizer solution Take 2.5 mg by nebulization every 6 (six) hours as needed for wheezing or shortness of breath.    [provider]  azaTHIOprine (IMURAN) 50 MG tablet Take 200 mg by mouth every evening.     [provider]  Cholecalciferol (VITAMIN D3) 5000 units CAPS Take 5,000 Units by mouth every morning.     [provider]  dorzolamide-timolol (COSOPT) 22.3-6.8 MG/ML ophthalmic solution Place 1 drop into both eyes 2 (two) times daily.    [provider]  DULoxetine (CYMBALTA) 30 MG capsule Take by mouth. 03/08/17 03/08/18  [provider]  levocetirizine (XYZAL) 5 MG tablet Take 5 mg by mouth 2 (two) times daily.     [provider]  lisdexamfetamine (VYVANSE) 50 MG capsule Take 50 mg by mouth every morning.    [provider]  lisinopril (PRINIVIL,ZESTRIL) 5 MG tablet Take 5 mg by mouth daily.    [provider]  Magnesium 250 MG TABS Take 250 mg by mouth 2 (two) times daily.     [provider]  ondansetron (ZOFRAN ODT) 4 MG disintegrating  tablet Take 1 tablet (4 mg total) by mouth every 8 (eight) hours as needed for nausea. 10/08/16   Larene Pickett, PA-C  prednisoLONE acetate (PRED FORTE) 1 % ophthalmic suspension Place 1 drop into both eyes 2 (two) times daily.    [provider]  TRULICITY 1.5 WN/4.6EV SOPN  05/29/16   [provider]    Family History Family History  Adopted: Yes  Problem Relation Age of Onset  . Healthy Daughter   . Cancer Maternal Grandmother   . Alcoholism Mother   . Diabetes Paternal Grandmother     Social History Social History   Tobacco Use  . Smoking status: Never Smoker  . Smokeless tobacco: Never Used  Substance Use Topics  . Alcohol use: No  . Drug use: No     Allergies   Piper; Strawberry extract; and Metformin  and related   Review of Systems Review of Systems  Gastrointestinal: Positive for nausea.  Neurological: Positive for dizziness.  All other systems reviewed and are negative.    Physical Exam Updated Vital Signs BP 108/82 (BP Location: Left Arm)   Pulse 77   Temp 98.2 F (36.8 C) (Oral)   Resp 15   SpO2 100%   Physical Exam  Constitutional: She is oriented to person, place, and time. She appears well-developed and well-nourished.  HENT:  Head: Normocephalic.  Left Ear: External ear normal.  Superficial laceration right eyebrow,  Cover by steristip  Eyes: Pupils are equal, round, and reactive to light. Conjunctivae and EOM are normal.  Neck: Normal range of motion.  Cardiovascular: Normal rate.  Pulmonary/Chest: Effort normal.  Abdominal: She exhibits no distension.  Musculoskeletal: Normal range of motion.  Neurological: She is alert and oriented to person, place, and time. No cranial nerve deficit.  Psychiatric: She has a normal mood and affect.  Nursing note and vitals reviewed.    ED Treatments / Results  Labs (all labs ordered are listed, but only abnormal results are displayed) Labs Reviewed - No data to display  EKG None  Radiology Ct Head Wo Contrast  Result Date: 09/27/2017 CLINICAL DATA:  [place] [address] and hit her head 2 days ago. Was told she had a mild concussion at urgent care. EXAM: CT HEAD WITHOUT CONTRAST TECHNIQUE: Contiguous axial images were obtained from the base of the skull through the vertex without intravenous contrast. COMPARISON:  MRI 10/08/2016 and CT head 01/07/2012 FINDINGS: Brain: No evidence of acute infarction, hemorrhage, hydrocephalus, extra-axial collection or mass lesion/mass effect. Vascular: No hyperdense vessel or unexpected calcification. Skull: Normal. Negative for fracture or focal lesion. Sinuses/Orbits: Air-fluid levels are identified within the maxillary sinuses bilaterally. Postoperative changes of the RIGHT orbit. Other: None  IMPRESSION: 1.  No evidence for acute intracranial abnormality. 2. Bilateral maxillary sinus disease. Electronically Signed   By: Nolon Nations M.D.   On: 09/27/2017 20:22    Procedures Procedures (including critical care time)  Medications Ordered in ED Medications - No data to display   Initial Impression / Assessment and Plan / ED Course  I have reviewed the triage vital signs and the nursing notes.  Pertinent labs & imaging results that were available during my care of the patient were reviewed by me and considered in my medical decision making (see chart for details).     Ct reviewed, pt counseled on results.  Pt advised to follow up with worker comp as scheduled.   Final Clinical Impressions(s) / ED Diagnoses   Final diagnoses:  Minor head  injury, initial encounter    ED Discharge Orders    None    An After Visit Summary was printed and given to the patient.    Sidney Ace 09/27/17 2051    Merrily Pew, MD 09/27/17 [phone]

## 2017-09-27 NOTE — ED Notes (Signed)
Patient verbalized understanding of discharge instructions, no questions. Patient ambulated out of ED with steady gait in no distress.  

## 2017-09-27 NOTE — Discharge Instructions (Addendum)
Return if any problems.

## 2017-09-27 NOTE — ED Triage Notes (Signed)
Pt sent by worker's comp. Pt fell and hit her head 2 days ago. Pt was seen at urgent care told pt she had mild concussion. Pt's employer said she needed a CT scan in order to go back to work.

## 2017-10-12 ENCOUNTER — Ambulatory Visit: Payer: BC Managed Care – PPO | Admitting: Neurology

## 2017-10-26 ENCOUNTER — Encounter: Payer: Self-pay | Admitting: Neurology

## 2017-10-26 ENCOUNTER — Ambulatory Visit: Payer: BC Managed Care – PPO | Admitting: Neurology

## 2017-10-26 VITALS — BP 104/70 | HR 66 | Ht 61.5 in | Wt 250.1 lb

## 2017-10-26 DIAGNOSIS — G932 Benign intracranial hypertension: Secondary | ICD-10-CM

## 2017-10-26 NOTE — Patient Instructions (Signed)
Return to clinic in 6 months.

## 2017-10-26 NOTE — Progress Notes (Signed)
Follow-up Visit   Date: 10/26/17    Denise Cook MRN: [id number] DOB: 01-Mar-1976   Interim History: Denise Cook is a 42 y.o. 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  History of present illness: Starting in the fall of 2014, she began having daily headache at the base of her head, described as throbbing ache. She has nausea, photophobia, and phonophobia. She has tried toradol injection, tramadol, hydrocodone, GON block, and exedrin migraine without any relief.  MRI of the brain was suggestive of pseudotumor cerebri and was confirmed by LP (OP of 41mmHg). She saw her ophthalmologist (Dr. Nehemiah Massed) who did not find any evidence of increased IOP.  In the Spring of 2015, she had worsening headaches, despite increasing diamox so underwent second LP which showed OP 37.  Headaches were even worse than before, so diamox was increased to 1000 BID.  She continued to headaches so lasix was added and was referred to neurosurgery who did not recommend shunt due to no papilledema and vision symptoms.  About 10-days ago (soon after started lasix), headaches slowly improved to 4/10 most days, when severe 10/10 (occuring 3-times per week).    She was last seen in April 2015 for headaches related to psuedotumor cerebri and was taking Diamox 1000mg  BID and lasix 20mg  daily.  She discontinued these medications in August 2016 during which time she had aqueous shunt placed in her right eye in August 2016 due to glaucoma.  Her headaches did not significantly worsen despite stopping her medications.   UPDATE 04/13/2017:  Over the past 1 month, she has developed  new daily headaches which she attributes to social stressors (mother is living with her, she is legal guardian for her brother with mental illness).   She has suffered 2 unprovoked falls while walking on level surface.  There was no preceding pain, paresthesias, or weakness.  She was easily able to stand up again, but does not know what caused her to falls.  She was recently started on Cymbalta 30mg  by her rheumatologist for generalized pain, what sounds like fibromyalgia.    UPDATE 07/16/2017:  She is here for acute visit for leg weakness.  For the past few weeks, she has increased heaviness of the legs with walking and standing and has started to restrict how far she walks.  She denies any pain, cramps, or low back pain. She has fallen about three times.  She had spinal tap on 4/4 and was hoping that could alleviate her symptoms, but there was no improvement.  She also complains of dizziness, described as room spinning with nausea.  Headaches are doing well and she does not take medications, she attributes the improvement to improved life stressors.   UPDATE 10/26/2017:   She is here for follow-up visit.  She underwent extensive testing to evaluate her leg weakness, including MRI of the neuroaxis and NCS/EMG which was essentially normal.  She continues to have spells of leg weakness and had one fall a few weeks ago in the sitting of dizziness.  She is mostly bothered by ongoing spells of spinning sensation, provoked by positional changes.  She also complains of sinus congestion and drainage. She tried meclizine,  but did not appreciate benefit and is scheduled to see ENT next month. Headaches are doing really well and infrequent.    Medications:  Current Outpatient Medications on File Prior to Visit  Medication Sig Dispense Refill  . acetaminophen (TYLENOL) 500 MG tablet Take 1,000 mg by mouth every 6 (six) hours as needed for headache. Reported on 07/06/2015    . acetaZOLAMIDE (DIAMOX) 500 MG capsule Take 1  capsule (500 mg total) by mouth 2 (two) times daily. 180 capsule 3  . albuterol (PROVENTIL) (2.5 MG/3ML) 0.083% nebulizer solution Take 2.5 mg by nebulization every 6 (six) hours as needed for wheezing or shortness of breath.    . azaTHIOprine (IMURAN) 50 MG tablet Take 200 mg by mouth every evening.     . Cholecalciferol (VITAMIN D3) 5000 units CAPS Take 5,000 Units by mouth every morning.     . dorzolamide-timolol (COSOPT) 22.3-6.8 MG/ML ophthalmic solution Place 1 drop into both eyes 2 (two) times daily.    . DULoxetine (CYMBALTA) 30 MG capsule Take by mouth.    . levocetirizine (XYZAL) 5 MG tablet Take 5 mg by mouth 2 (two) times daily.     Marland Kitchen lisdexamfetamine (VYVANSE) 50 MG capsule Take 50 mg by mouth every morning.    Marland Kitchen lisinopril (PRINIVIL,ZESTRIL) 5 MG tablet Take 5 mg by mouth daily.    . Magnesium 250 MG TABS Take 250 mg by mouth 2 (two) times daily.     . ondansetron (ZOFRAN ODT) 4 MG disintegrating tablet Take 1 tablet (4 mg total) by mouth every 8 (eight) hours as needed for nausea. 12 tablet 0  . ONE TOUCH ULTRA TEST test strip USE TO CHECK BLOOD SUGAR ONCE DAILY. ALTERNATING MORNINGS AND EVENINGS BEFORE MEALS  3  . prednisoLONE acetate (PRED FORTE) 1 % ophthalmic suspension Place 1 drop into both eyes 2 (two) times daily.    . TRULICITY 1.5 DS/2.8JG SOPN      No current facility-administered medications on file prior to visit.     Allergies:  Allergies  Allergen Reactions  . Piper Swelling  . Strawberry Extract Itching and Swelling  . Metformin And Related Itching     Review of Systems:  CONSTITUTIONAL: No fevers, chills, night sweats, weight loss.  EYES: +visual changes or eye pain ENT: No hearing changes.  No history of nose bleeds.   RESPIRATORY: +cough, wheezing and shortness of breath.   CARDIOVASCULAR: Negative for chest pain, and palpitations.   GI: Negative for abdominal discomfort, blood in stools or black stools.  No recent change in bowel habits.   GU:  No  history of incontinence.   MUSCLOSKELETAL: +history of joint pain or swelling.  +myalgias.   SKIN: Negative for lesions, rash, and itching.   ENDOCRINE: Negative for cold or heat intolerance, polydipsia or goiter.   PSYCH:  No depression or anxiety symptoms.   NEURO: As Above.   Vital Signs:  BP 104/70   Pulse 66   Ht 5' 1.5" (1.562 m)   Wt 250 lb 2 oz (113.5 kg)   SpO2 100%   BMI 46.50 kg/m   Neurological Exam: MENTAL STATUS including orientation to time, place, person, recent and remote memory, attention span and concentration, language, and fund of knowledge is normal.  Speech is not dysarthric.  CRANIAL NERVES:  No visual field defects. Pupils equal round and reactive to light. Iridocyclitis on left.  Normal conjugate, extra-ocular eye movements in all directions of gaze.  Mild left ptisis.  Face is symmetric.  Palate elevates symmetrically.  Tongue is midline.  MOTOR:  Motor strength is 5/5 in all extremities.  No atrophy, fasciculations or abnormal movements.  No pronator drift.  Tone is normal.    MSRs:  Reflexes are 2+/4 throughout, except 3+/4 bilateral patella jerks.    COORDINATION/GAIT:  Normal finger-to- nose-finger.  Intact rapid alternating movements bilaterally.  Gait narrow based and stable. She is able to stand up from chair without using arms to help push off.     Data: EMG of the left leg 05/08/2013:  This is a normal electrodiagnostic study of the right lower extremity.   MRI brain 04/24/2013:   1. Partially empty sella configuration, new since 2007. In this clinical setting consider idiopathic intracranial hypertension  (pseudotumor cerebri), although this appearance of the pituitary can be a normal anatomic variant.  2. Otherwise largely stable and normal for age MRI appearance of the brain since 2007.   MRI brain wwo contrast 06/25/2017: 1.  No acute intracranial abnormality. 2. No abnormal intracranial enhancement or dural thickening to suggest  neurosarcoidosis. Mild nonspecific subcortical cerebral white matter signal changes may have progressed since 2015. 3. Chronic partially empty sella and prominence of the optic nerve root sleeves again compatible with idiopathic intracranial hypertension (pseudotumor cerebri). 4. Mild bilateral maxillary sinus inflammation with small fluid levels is new since 2018.  CSF 05/14/2013:  OP 30   R0 W0  G53  P27, ACE neg CSF 06/24/2013:  OP 37  R1  W0  G48  P34, ACE neg  Lab Results  Component Value Date   CREATININE 1.03 07/16/2017   BUN 12 07/16/2017   NA 141 07/16/2017   K 4.2 07/16/2017   CL 107 07/16/2017   CO2 20 07/16/2017    MRI lumbar spine 08/02/2017:  Normal  MRI cervical and thoracic spine 08/19/2017:   1. No evidence of myelopathy. 2. Small midthoracic disc protrusions. No impingement throughout the cervicothoracic canal and foramina.  NCS/EMG of the legs 08/28/2017:  This is a normal study of the lower extremities. In particular, there is no evidence of a diffuse myopathy, sensorimotor polyneuropathy, or lumbosacral radiculopathy.   IMPRESSION/PLAN 1.  Pseudotumor cerebri, diagnosed 05/2013 (OP 30cm) - stable and well-controlled on medications  - Continue Diamox 500mg  twice daily.    - Avoid topiramate due to glaucoma  2.  Bilateral leg weakness - unclear etiology.  Exam today shows normal strength.  Imaging of the entire neuroaxis and NCS/EMG has been normal.  Reassurance provided that there is no evidence of myopathy, lumbosacral radiculopathy, or NMJ disorder.     3.  BPPV, continue meclizine 12.5mg  BID.  She has an upcoming appointment with ENT   Return to clinic in 6 months  Greater than 50% of this 15 minute visit was spent in counseling, explanation of diagnosis, planning of further management, and coordination of care.   Thank you for allowing me to participate in patient's care.  If I can answer any additional questions, I would be pleased to do so.     Sincerely,    Aayden Cefalu K. Posey Pronto, DO

## 2017-12-06 ENCOUNTER — Encounter: Payer: Self-pay | Admitting: Neurology

## 2017-12-24 ENCOUNTER — Telehealth: Payer: Self-pay | Admitting: Neurology

## 2017-12-24 NOTE — Telephone Encounter (Signed)
Patient called in stating her OBGYN was wanting to put her on estrogen and wanted to make sure that it would not effect her Diamox medication. Please call her back at [phone]. Thanks!

## 2017-12-26 NOTE — Telephone Encounter (Signed)
Please advise 

## 2017-12-27 NOTE — Telephone Encounter (Signed)
There is no drug-drug interaction that I am aware of.  She can have her pharmacist review medications to be sure.

## 2017-12-27 NOTE — Telephone Encounter (Signed)
Patient informed and will check with pharmacist also.

## 2018-02-11 ENCOUNTER — Encounter (HOSPITAL_COMMUNITY): Payer: Self-pay | Admitting: Emergency Medicine

## 2018-02-11 ENCOUNTER — Emergency Department (HOSPITAL_COMMUNITY)
Admission: EM | Admit: 2018-02-11 | Discharge: 2018-02-12 | Disposition: A | Discharge status: Home / Self Care | Payer: BC Managed Care – PPO | Attending: Emergency Medicine | Admitting: Emergency Medicine

## 2018-02-11 ENCOUNTER — Other Ambulatory Visit: Payer: Self-pay

## 2018-02-11 ENCOUNTER — Telehealth: Payer: BC Managed Care – PPO | Admitting: Family

## 2018-02-11 DIAGNOSIS — J45909 Unspecified asthma, uncomplicated: Secondary | ICD-10-CM | POA: Diagnosis not present

## 2018-02-11 DIAGNOSIS — R1011 Right upper quadrant pain: Secondary | ICD-10-CM

## 2018-02-11 DIAGNOSIS — Z79899 Other long term (current) drug therapy: Secondary | ICD-10-CM | POA: Insufficient documentation

## 2018-02-11 DIAGNOSIS — K2900 Acute gastritis without bleeding: Secondary | ICD-10-CM | POA: Diagnosis not present

## 2018-02-11 DIAGNOSIS — E119 Type 2 diabetes mellitus without complications: Secondary | ICD-10-CM | POA: Insufficient documentation

## 2018-02-11 DIAGNOSIS — R1013 Epigastric pain: Secondary | ICD-10-CM | POA: Diagnosis present

## 2018-02-11 LAB — COMPREHENSIVE METABOLIC PANEL
ALBUMIN: 4.2 g/dL (ref 3.5–5.0)
ALT: 17 U/L (ref 0–44)
AST: 21 U/L (ref 15–41)
Alkaline Phosphatase: 62 U/L (ref 38–126)
Anion gap: 8 (ref 5–15)
BILIRUBIN TOTAL: 0.7 mg/dL (ref 0.3–1.2)
BUN: 10 mg/dL (ref 6–20)
CO2: 25 mmol/L (ref 22–32)
CREATININE: 1.09 mg/dL — AB (ref 0.44–1.00)
Calcium: 9.1 mg/dL (ref 8.9–10.3)
Chloride: 107 mmol/L (ref 98–111)
GFR calc Af Amer: >60 mL/min (ref >60)
GFR calc non Af Amer: >60 mL/min (ref >60)
GLUCOSE: 91 mg/dL (ref 70–99)
Potassium: 3.5 mmol/L (ref 3.5–5.1)
Sodium: 140 mmol/L (ref 135–145)
Total Protein: 7.6 g/dL (ref 6.5–8.1)

## 2018-02-11 LAB — CBC
HEMATOCRIT: 39.4 % (ref 36.0–46.0)
Hemoglobin: 12.4 g/dL (ref 12.0–15.0)
MCH: 29.9 pg (ref 26.0–34.0)
MCHC: 31.5 g/dL (ref 30.0–36.0)
MCV: 94.9 fL (ref 80.0–100.0)
Platelets: 424 10*3/uL — ABNORMAL HIGH (ref 150–400)
RBC: 4.15 MIL/uL (ref 3.87–5.11)
RDW: 15.2 % (ref 11.5–15.5)
WBC: 9.6 10*3/uL (ref 4.0–10.5)
nRBC: 0 % (ref 0.0–0.2)

## 2018-02-11 LAB — LIPASE, BLOOD: Lipase: 36 U/L (ref 11–51)

## 2018-02-11 MED ORDER — SODIUM CHLORIDE (PF) 0.9 % IJ SOLN
INTRAMUSCULAR | Status: DC
Start: 2018-02-11 — End: 2018-02-12
  Filled 2018-02-11: qty 50

## 2018-02-11 MED ORDER — IOPAMIDOL (ISOVUE-300) INJECTION 61%
INTRAVENOUS | Status: AC
  Filled 2018-02-11: qty 100

## 2018-02-11 MED ORDER — SODIUM CHLORIDE 0.9 % IV BOLUS
1000.0000 mL | Freq: Once | INTRAVENOUS | Status: AC
  Administered 2018-02-12: 1000 mL via INTRAVENOUS

## 2018-02-11 NOTE — ED Triage Notes (Signed)
Pt presents with epigastric burning that wraps around her right side to her back. [facility] where they did blood work and stated that her lipase and amylase were normal but her WBC was slightly elevated. Patient did an e visit today and they stated that she needed to be evaluated in the ED. Patient endorses constant nausea which she has been taking zofran for with no relief. Patient has had her gallbladder removed.

## 2018-02-11 NOTE — Progress Notes (Signed)
Based on what you shared with me it looks like you have a serious condition that should be evaluated in a face to face office visit.  NOTE: If you entered your credit card information for this eVisit, you will not be charged. You may see a "hold" on your card for the $30 but that hold will drop off and you will not have a charge processed.  If you are having a true medical emergency please call 911.  If you need an urgent face to face visit, [place] has four urgent care centers for your convenience.  If you need care fast and have a high deductible or no insurance consider:   [email or web address] to reserve your spot online an avoid wait times  [facility] [address], [address] [place], [place] [address] 8 am to 8 pm Monday-Friday 10 am to 4 pm Saturday-Sunday *Across the street from [employer]  [facility]  [address] [place], [address] 8 am to 5 pm Monday-Friday * In the [facility]   The following sites will take your  insurance:  . [facility]  [phone] Get Driving Directions Find a Provider at this Location  [address] [place], [place] [address] . 10 am to 8 pm Monday-Friday . 12 pm to 8 pm Saturday-Sunday   . [facility]  [phone] Get Driving Directions Find a Provider at this Location  [address], [address] [place], [place] [address] . 8 am to 8 pm Monday-Friday . 9 am to 6 pm Saturday . 11 am to 6 pm Sunday   . [facility]  [phone] Get Driving Directions  [address] [place], [place] [address] . 8 am to 8 pm Monday-Friday . 8 am to 4 pm Saturday-Sunday   Your e-visit answers were reviewed by a board certified advanced clinical practitioner to complete your personal care plan.  Thank you for using e-Visits.  

## 2018-02-12 ENCOUNTER — Emergency Department (HOSPITAL_COMMUNITY): Payer: BC Managed Care – PPO

## 2018-02-12 ENCOUNTER — Encounter (HOSPITAL_COMMUNITY): Payer: Self-pay

## 2018-02-12 LAB — URINALYSIS, ROUTINE W REFLEX MICROSCOPIC
BILIRUBIN URINE: NEGATIVE
Glucose, UA: NEGATIVE mg/dL
Hgb urine dipstick: NEGATIVE
Ketones, ur: NEGATIVE mg/dL
Leukocytes, UA: NEGATIVE
NITRITE: NEGATIVE
Protein, ur: NEGATIVE mg/dL
SPECIFIC GRAVITY, URINE: 1.011 (ref 1.005–1.030)
pH: 5 (ref 5.0–8.0)

## 2018-02-12 MED ORDER — ESOMEPRAZOLE MAGNESIUM 40 MG PO CPDR: 40.0000 mg | DELAYED_RELEASE_CAPSULE | Freq: Every day | ORAL | 0 refills | Status: DC

## 2018-02-12 MED ORDER — SUCRALFATE 1 G PO TABS
1.0000 g | ORAL_TABLET | Freq: Once | ORAL | Status: AC
  Administered 2018-02-12: 1 g via ORAL
  Filled 2018-02-12: qty 1

## 2018-02-12 MED ORDER — IOPAMIDOL (ISOVUE-300) INJECTION 61%
100.0000 mL | Freq: Once | INTRAVENOUS | Status: AC | PRN
  Administered 2018-02-12: 100 mL via INTRAVENOUS

## 2018-02-12 NOTE — ED Notes (Signed)
Patient transported to CT 

## 2018-02-12 NOTE — ED Notes (Signed)
Urine and culture sent to lab  

## 2018-02-12 NOTE — Discharge Instructions (Signed)
Return here as needed. Follow up with your GI doctor or the one provided. °

## 2018-02-13 NOTE — ED Provider Notes (Signed)
[facility] DEPT Provider Note   CSN: [id number] Arrival date & time: 02/11/18  1847     History   Chief Complaint Chief Complaint  Patient presents with  . Abdominal Pain    HPI Chistina Cook is a 42 y.o. female.  HPI Patient presents to the emergency department with tingling in the right epigastric region that wraps around to her back.  Patient states she has had her gallbladder removed.  Patient states she is was seen at the walk-in clinic and had normal lab testing at that time.  She states that her white blood cells were slightly..  The patient denies chest pain, shortness of breath, headache,blurred vision, neck pain, fever, cough, weakness, numbness, dizziness, anorexia, edema, vomiting, diarrhea, rash, back pain, dysuria, hematemesis, bloody stool, near syncope, or syncope. Past Medical History:  Diagnosis Date  . Allergy   . Asthma   . Depression   . Diabetes mellitus    diet control  . Glaucoma   . Obesity   . Pseudotumor cerebri   . Sarcoidosis 2012    Patient Active Problem List   Diagnosis Date Noted  . Pseudotumor cerebri 04/21/2013  . Leg pain, bilateral 04/15/2013    Past Surgical History:  Procedure Laterality Date  . ABDOMINAL HYSTERECTOMY    . CARDIAC CATHETERIZATION    . CHOLECYSTECTOMY    . EYE SURGERY Left    Removed vitreous membrane  . glaucoma shunt    . INCISION AND DRAINAGE Right 05/01/2014   Procedure: INCISION AND DRAINAGE RIGHT INDEX FLEXOR SHEATH  ;  Surgeon: Charlotte Crumb, MD;  Location: [facility];  Service: Orthopedics;  Laterality: Right;  . left eye surgery       OB History   None      Home Medications    Prior to Admission medications   Medication Sig Start Date End Date Taking? Authorizing Provider  acetaminophen (TYLENOL) 500 MG tablet Take 1,000 mg by mouth every 6 (six) hours as needed for headache. Reported on 07/06/2015   Yes [provider]    acetaZOLAMIDE (DIAMOX) 500 MG capsule Take 1 capsule (500 mg total) by mouth 2 (two) times daily. 04/13/17  Yes Patel, Donika K, DO  albuterol (PROVENTIL) (2.5 MG/3ML) 0.083% nebulizer solution Take 2.5 mg by nebulization every 6 (six) hours as needed for wheezing or shortness of breath.   Yes [provider]  azaTHIOprine (IMURAN) 50 MG tablet Take 200 mg by mouth every evening.    Yes [provider]  Cholecalciferol (VITAMIN D3) 5000 units CAPS Take 5,000 Units by mouth every morning.    Yes [provider]  dorzolamide-timolol (COSOPT) 22.3-6.8 MG/ML ophthalmic solution Place 1 drop into both eyes 2 (two) times daily.   Yes [provider]  DULoxetine (CYMBALTA) 30 MG capsule Take 30 mg by mouth daily.  03/08/17 03/08/18 Yes [provider]  estradiol (VIVELLE-DOT) 0.05 MG/24HR patch Place 1 patch onto the skin 2 (two) times a week. 01/21/18  Yes [provider]  levocetirizine (XYZAL) 5 MG tablet Take 5 mg by mouth 2 (two) times daily.    Yes [provider]  lisdexamfetamine (VYVANSE) 50 MG capsule Take 60 mg by mouth every morning.    Yes [provider]  Magnesium 250 MG TABS Take 250 mg by mouth 2 (two) times daily.    Yes [provider]  prednisoLONE acetate (PRED FORTE) 1 % ophthalmic suspension Place 1 drop into both eyes 2 (two)  times daily.   Yes [provider]  TRULICITY 1.5 JJ/0.0XF SOPN Inject 0.5 mLs into the skin every 7 (seven) days.  05/29/16  Yes [provider]  esomeprazole (NEXIUM) 40 MG capsule Take 1 capsule (40 mg total) by mouth daily. 02/12/18   Claudell Wohler, Harrell Gave, PA-C  ondansetron (ZOFRAN ODT) 4 MG disintegrating tablet Take 1 tablet (4 mg total) by mouth every 8 (eight) hours as needed for nausea. Patient not taking: Reported on 02/12/2018 10/08/16   Larene Pickett, PA-C  ONE TOUCH ULTRA TEST test strip USE TO CHECK BLOOD SUGAR ONCE DAILY. ALTERNATING MORNINGS AND  EVENINGS BEFORE MEALS 08/20/17   [provider]    Family History Family History  Adopted: Yes  Problem Relation Age of Onset  . Healthy Daughter   . Cancer Maternal Grandmother   . Alcoholism Mother   . Diabetes Paternal Grandmother     Social History Social History   Tobacco Use  . Smoking status: Never Smoker  . Smokeless tobacco: Never Used  Substance Use Topics  . Alcohol use: No  . Drug use: No     Allergies   Piper; Strawberry extract; and Metformin and related   Review of Systems Review of Systems All other systems negative except as documented in the HPI. All pertinent positives and negatives as reviewed in the HPI.   Physical Exam Updated Vital Signs BP (!) 147/103 (BP Location: Left Arm)   Pulse 89   Temp 98 F (36.7 C) (Oral)   Resp 16   Ht 5' 1.5" (1.562 m)   Wt 118.8 kg   SpO2 100%   BMI 48.70 kg/m   Physical Exam  Constitutional: She is oriented to person, place, and time. She appears well-developed and well-nourished. No distress.  HENT:  Head: Normocephalic and atraumatic.  Mouth/Throat: Oropharynx is clear and moist.  Eyes: Pupils are equal, round, and reactive to light.  Neck: Normal range of motion. Neck supple.  Cardiovascular: Normal rate, regular rhythm and normal heart sounds. Exam reveals no gallop and no friction rub.  No murmur heard. Pulmonary/Chest: Effort normal and breath sounds normal. No respiratory distress. She has no wheezes.  Abdominal: Soft. Bowel sounds are normal. She exhibits no distension. There is tenderness in the right upper quadrant and epigastric area. There is no rigidity, no rebound and no guarding.  Neurological: She is alert and oriented to person, place, and time. She exhibits normal muscle tone. Coordination normal.  Skin: Skin is warm and dry. Capillary refill takes less than 2 seconds. No rash noted. No erythema.  Psychiatric: She has a normal mood and affect. Her behavior is normal.  Nursing  note and vitals reviewed.    ED Treatments / Results  Labs (all labs ordered are listed, but only abnormal results are displayed) Labs Reviewed  COMPREHENSIVE METABOLIC PANEL - Abnormal; Notable for the following components:      Result Value   Creatinine, Ser 1.09 (*)    All other components within normal limits  CBC - Abnormal; Notable for the following components:   Platelets 424 (*)    All other components within normal limits  LIPASE, BLOOD  URINALYSIS, ROUTINE W REFLEX MICROSCOPIC    EKG None  Radiology Ct Abdomen Pelvis W Contrast  Result Date: 02/12/2018 CLINICAL DATA:  Acute onset of epigastric abdominal pain and burning. Pain radiates to the right side of the back. Nausea. EXAM: CT ABDOMEN AND PELVIS WITH CONTRAST TECHNIQUE: Multidetector CT imaging of the abdomen and  pelvis was performed using the standard protocol following bolus administration of intravenous contrast. CONTRAST:  153mL ISOVUE-300 IOPAMIDOL (ISOVUE-300) INJECTION 61% COMPARISON:  MRI of the lumbar spine performed 08/02/2017, and CT of the abdomen and pelvis performed 12/07/2006 FINDINGS: Lower chest: Minimal bibasilar atelectasis is noted. The visualized portions of the mediastinum are unremarkable. Hepatobiliary: The liver is unremarkable in appearance. The patient is status post cholecystectomy, with clips noted at the gallbladder fossa. The common bile duct remains normal in caliber. Pancreas: The pancreas is within normal limits. Spleen: The spleen is unremarkable in appearance. Adrenals/Urinary Tract: The adrenal glands are unremarkable in appearance. The kidneys are within normal limits. There is no evidence of hydronephrosis. No renal or ureteral stones are identified. No perinephric stranding is seen. Stomach/Bowel: The stomach is unremarkable in appearance. The small bowel is within normal limits. The appendix is normal in caliber, without evidence of appendicitis. The colon is unremarkable in  appearance. Vascular/Lymphatic: The abdominal aorta is unremarkable in appearance. The inferior vena cava is grossly unremarkable. No retroperitoneal lymphadenopathy is seen. No pelvic sidewall lymphadenopathy is identified. Reproductive: The bladder is mildly distended and grossly unremarkable. The patient is status post hysterectomy. No suspicious adnexal masses are seen. Other: No additional soft tissue abnormalities are seen. Musculoskeletal: No acute osseous abnormalities are identified. The visualized musculature is unremarkable in appearance. IMPRESSION: Unremarkable contrast-enhanced CT of the abdomen and pelvis. Electronically Signed   By: Garald Balding M.D.   On: 02/12/2018 01:35    Procedures Procedures (including critical care time)  Medications Ordered in ED Medications  sodium chloride 0.9 % bolus 1,000 mL (0 mLs Intravenous Stopped 02/12/18 0341)  iopamidol (ISOVUE-300) 61 % injection 100 mL (100 mLs Intravenous Contrast Given 02/12/18 0107)  sucralfate (CARAFATE) tablet 1 g (1 g Oral Given 02/12/18 0340)     Initial Impression / Assessment and Plan / ED Course  I have reviewed the triage vital signs and the nursing notes.  Pertinent labs & imaging results that were available during my care of the patient were reviewed by me and considered in my medical decision making (see chart for details).     Patient CT scan did not show any significant abnormality.  Patient will be discharged home and given treatment for gastritis.  Advised her to follow-up with GI as well.  Patient agrees the plan and all questions were answered.  Final Clinical Impressions(s) / ED Diagnoses   Final diagnoses:  Acute superficial gastritis without hemorrhage    ED Discharge Orders         Ordered    esomeprazole (NEXIUM) 40 MG capsule  Daily     02/12/18 0318           Dalia Heading, PA-C 02/13/18 0617    Molpus, Jenny Reichmann, MD 02/13/18 [phone]

## 2018-03-18 ENCOUNTER — Other Ambulatory Visit: Payer: Self-pay

## 2018-03-18 ENCOUNTER — Ambulatory Visit (HOSPITAL_COMMUNITY)
Admission: EM | Admit: 2018-03-18 | Discharge: 2018-03-18 | Disposition: A | Discharge status: Home / Self Care | Payer: BC Managed Care – PPO | Attending: Family Medicine | Admitting: Family Medicine

## 2018-03-18 ENCOUNTER — Encounter (HOSPITAL_COMMUNITY): Payer: Self-pay | Admitting: *Deleted

## 2018-03-18 ENCOUNTER — Ambulatory Visit (INDEPENDENT_AMBULATORY_CARE_PROVIDER_SITE_OTHER): Payer: BC Managed Care – PPO

## 2018-03-18 DIAGNOSIS — R05 Cough: Secondary | ICD-10-CM | POA: Diagnosis not present

## 2018-03-18 DIAGNOSIS — R0602 Shortness of breath: Secondary | ICD-10-CM

## 2018-03-18 DIAGNOSIS — J22 Unspecified acute lower respiratory infection: Secondary | ICD-10-CM | POA: Diagnosis not present

## 2018-03-18 MED ORDER — BENZONATATE 200 MG PO CAPS: 200.0000 mg | ORAL_CAPSULE | Freq: Two times a day (BID) | ORAL | 0 refills | Status: DC | PRN

## 2018-03-18 MED ORDER — AMOXICILLIN-POT CLAVULANATE 875-125 MG PO TABS: 1.0000 | ORAL_TABLET | Freq: Two times a day (BID) | ORAL | 0 refills | Status: DC

## 2018-03-18 MED ORDER — FLUCONAZOLE 150 MG PO TABS: 150.0000 mg | ORAL_TABLET | Freq: Every day | ORAL | 0 refills | Status: DC

## 2018-03-18 MED ORDER — IBUPROFEN 800 MG PO TABS: 800.0000 mg | ORAL_TABLET | Freq: Three times a day (TID) | ORAL | 0 refills | Status: DC

## 2018-03-18 NOTE — ED Triage Notes (Signed)
C/o cough with sob with fever and chills onset Wed. States she was seen by her PCP wed and went to [facility] walk in clinic on Sat states she isn't getting better now has green sputum, with increased sob.

## 2018-03-18 NOTE — Discharge Instructions (Signed)
The best cough management for viral bronchitis includes Tessalon 200 mg 3 times a day with dextromethorphan (DM like and Robitussin-DM or Mucinex DM or Delsym) per package instructions Drink lots of fluids Use a humidifier Take antibiotic 2 times a day as instructed Get plenty of rest Call or return if not better in 2 to 3 days The cough from viral bronchitis can persist for up to 4 weeks

## 2018-03-18 NOTE — ED Provider Notes (Signed)
[facility]    CSN: [id number] Arrival date & time: 03/18/18  1828     History   Chief Complaint Chief Complaint  Patient presents with  . Cough  . Generalized Body Aches    HPI Denise Cook is a 42 y.o. female.   HPI  Patient has had an upper respiratory infection for a week.  She has cough cold runny nose and sore throat initially with low-grade fever.  She has left with a harsh cough, chest pain with cough and deep breath, severe fatigue, and colored sputum.  She has been to her family doctor, and been to a urgent care visit.  They both told her that that she had a viral bronchitis and gave her symptomatic advice.  She is here because she continues to feel sick.  Her chest pain is worsening.  Her fatigue is worse.  She continues to run a low-grade fever.  She feels that she would like to be reevaluated to see if she does need an antibiotic.  She does not have underlying lung disease asthma or COPD.  She is not a smoker.  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  She feels this makes her prone to respiratory infections.  She did have her flu shot this year.  Past Medical History:  Diagnosis Date  . Allergy   . Asthma   . Depression   . Diabetes mellitus    diet control  . Glaucoma   . Obesity   . Pseudotumor cerebri   . Sarcoidosis 2012    Patient Active Problem List   Diagnosis Date Noted  . Pseudotumor cerebri 04/21/2013  . Leg pain, bilateral 04/15/2013    Past Surgical History:  Procedure Laterality Date  . ABDOMINAL HYSTERECTOMY    . CARDIAC CATHETERIZATION    . CHOLECYSTECTOMY    . EYE SURGERY Left    Removed vitreous membrane  . glaucoma shunt    . INCISION AND DRAINAGE Right 05/01/2014   Procedure: INCISION AND DRAINAGE RIGHT INDEX FLEXOR SHEATH  ;  Surgeon: Charlotte Crumb, MD;  Location: [place];  Service: Orthopedics;  Laterality: Right;  . left eye surgery      OB History   No obstetric history on file.      Home  Medications    Prior to Admission medications   Medication Sig Start Date End Date Taking? Authorizing Provider  acetaminophen (TYLENOL) 500 MG tablet Take 1,000 mg by mouth every 6 (six) hours as needed for headache. Reported on 07/06/2015    [provider]  acetaZOLAMIDE (DIAMOX) 500 MG capsule Take 1 capsule (500 mg total) by mouth 2 (two) times daily. 04/13/17   Patel, Arvin Collard K, DO  albuterol (PROVENTIL) (2.5 MG/3ML) 0.083% nebulizer solution Take 2.5 mg by nebulization every 6 (six) hours as needed for wheezing or shortness of breath.    [provider]  amoxicillin-clavulanate (AUGMENTIN) 875-125 MG tablet Take 1 tablet by mouth every 12 (twelve) hours. 03/18/18   Raylene Everts, MD  azaTHIOprine (IMURAN) 50 MG tablet Take 200 mg by mouth every evening.     [provider]  benzonatate (TESSALON) 200 MG capsule Take 1 capsule (200 mg total) by mouth 2 (two) times daily as needed for cough. 03/18/18   Raylene Everts, MD  Cholecalciferol (VITAMIN D3) 5000 units CAPS Take 5,000 Units by mouth every morning.     [provider]  dorzolamide-timolol (COSOPT) 22.3-6.8 MG/ML ophthalmic solution Place 1 drop into both eyes 2 (  two) times daily.    [provider]  DULoxetine (CYMBALTA) 30 MG capsule Take 30 mg by mouth daily.  03/08/17 03/08/18  [provider]  esomeprazole (NEXIUM) 40 MG capsule Take 1 capsule (40 mg total) by mouth daily. 02/12/18   Lawyer, Harrell Gave, PA-C  estradiol (VIVELLE-DOT) 0.05 MG/24HR patch Place 1 patch onto the skin 2 (two) times a week. 01/21/18   [provider]  fluconazole (DIFLUCAN) 150 MG tablet Take 1 tablet (150 mg total) by mouth daily. Repeat in 1 week if needed 03/18/18   Raylene Everts, MD  ibuprofen (ADVIL,MOTRIN) 800 MG tablet Take 1 tablet (800 mg total) by mouth 3 (three) times daily. 03/18/18   Raylene Everts, MD  levocetirizine (XYZAL) 5 MG tablet Take 5 mg by mouth 2 (two)  times daily.     [provider]  lisdexamfetamine (VYVANSE) 50 MG capsule Take 60 mg by mouth every morning.     [provider]  Magnesium 250 MG TABS Take 250 mg by mouth 2 (two) times daily.     [provider]  ONE TOUCH ULTRA TEST test strip USE TO CHECK BLOOD SUGAR ONCE DAILY. ALTERNATING MORNINGS AND EVENINGS BEFORE MEALS 08/20/17   [provider]  TRULICITY 1.5 OZ/3.6UY SOPN Inject 0.5 mLs into the skin every 7 (seven) days.  05/29/16   [provider]    Family History Family History  Adopted: Yes  Problem Relation Age of Onset  . Healthy Daughter   . Cancer Maternal Grandmother   . Alcoholism Mother   . Diabetes Paternal Grandmother     Social History Social History   Tobacco Use  . Smoking status: Never Smoker  . Smokeless tobacco: Never Used  Substance Use Topics  . Alcohol use: No  . Drug use: No     Allergies   [place]; Strawberry extract; and Metformin and related   Review of Systems Review of Systems  Constitutional: Positive for fatigue and fever. Negative for chills.  HENT: Negative for ear pain and sore throat.   Eyes: Negative for pain and visual disturbance.  Respiratory: Positive for cough and chest tightness. Negative for shortness of breath.   Cardiovascular: Positive for chest pain. Negative for palpitations.  Gastrointestinal: Negative for abdominal pain and vomiting.  Genitourinary: Negative for dysuria and hematuria.  Musculoskeletal: Negative for arthralgias and back pain.  Skin: Negative for color change and rash.  Neurological: Negative for seizures and syncope.  All other systems reviewed and are negative.    Physical Exam Triage Vital Signs ED Triage Vitals  Enc Vitals Group     BP 03/18/18 1927 105/78     Pulse Rate 03/18/18 1927 83     Resp 03/18/18 1927 20     Temp 03/18/18 1927 97.9 F (36.6 C)     Temp Source 03/18/18 1927 Oral     SpO2 03/18/18 1927 100 %     Weight --       Height --      Head Circumference --      Peak Flow --      Pain Score 03/18/18 1929 8     Pain Loc --      Pain Edu? --      Excl. in [place]? --    No data found.  Updated Vital Signs BP 105/78 (BP Location: Right Arm)   Pulse 83   Temp 97.9 F (36.6 C) (Oral)   Resp 20   SpO2 100%  Physical Exam Constitutional:      General: She is not in acute distress.    Appearance: She is well-developed. She is obese. She is ill-appearing.  HENT:     Head: Normocephalic and atraumatic.     Right Ear: Tympanic membrane and ear canal normal.     Left Ear: Tympanic membrane and ear canal normal.     Nose: Nose normal.     Mouth/Throat:     Mouth: Mucous membranes are moist.     Pharynx: Oropharynx is clear.  Eyes:     Conjunctiva/sclera: Conjunctivae normal.     Pupils: Pupils are equal, round, and reactive to light.  Neck:     Musculoskeletal: Normal range of motion.  Cardiovascular:     Rate and Rhythm: Normal rate and regular rhythm.     Heart sounds: Normal heart sounds.  Pulmonary:     Effort: Pulmonary effort is normal. No respiratory distress.     Breath sounds: No wheezing or rhonchi.     Comments: Decreased breath sounds in left base no wheeze or rales.  Harsh cough, strained voice Abdominal:     General: There is no distension.     Palpations: Abdomen is soft.     Tenderness: There is no abdominal tenderness.  Musculoskeletal: Normal range of motion.     Right lower leg: No edema.     Left lower leg: No edema.  Lymphadenopathy:     Cervical: No cervical adenopathy.  Skin:    General: Skin is warm and dry.  Neurological:     General: No focal deficit present.     Mental Status: She is alert. Mental status is at baseline.  Psychiatric:        Mood and Affect: Mood normal.        Thought Content: Thought content normal.      UC Treatments / Results  Labs (all labs ordered are listed, but only abnormal results are displayed) Labs Reviewed - No data to  display  EKG None  Radiology Dg Chest 2 View  Result Date: 03/18/2018 CLINICAL DATA:  Fever, cough, and shortness of breath. EXAM: CHEST - 2 VIEW COMPARISON:  CT chest and chest x-ray dated September 24, 2015. FINDINGS: The heart size and mediastinal contours are within normal limits. Both lungs are clear. The visualized skeletal structures are unremarkable. IMPRESSION: No active cardiopulmonary disease. Electronically Signed   By: Titus Dubin M.D.   On: 03/18/2018 20:31    Procedures Procedures (including critical care time)  Medications Ordered in UC Medications - No data to display  Initial Impression / Assessment and Plan / UC Course  I have reviewed the triage vital signs and the nursing notes.  Pertinent labs & imaging results that were available during my care of the patient were reviewed by me and considered in my medical decision making (see chart for details).     *Reviewed the CDC guidelines for treatment of possible respiratory infection.  Most of these are viruses.  She is more than a week into this, this is her third medical visit for worsening symptoms.  At this point believe an antibiotic is worth a try.  No guarantee that will work.  I am covering her with Diflucan in case she gets a yeast infection.  She may have ibuprofen as needed for pain.  Is cautioned to take this sparingly given her kidney disease, but she does have chest wallPain with coughing.  Given a work note to be off  for 2 days.  We discussed symptom management and cough management. Final Clinical Impressions(s) / UC Diagnoses   Final diagnoses:  LRTI (lower respiratory tract infection)     Discharge Instructions     The best cough management for viral bronchitis includes Tessalon 200 mg 3 times a day with dextromethorphan (DM like and Robitussin-DM or Mucinex DM or Delsym) per package instructions Drink lots of fluids Use a humidifier Take antibiotic 2 times a day as instructed Get plenty of  rest Call or return if not better in 2 to 3 days The cough from viral bronchitis can persist for up to 4 weeks    ED Prescriptions    Medication Sig Dispense Auth. Provider   amoxicillin-clavulanate (AUGMENTIN) 875-125 MG tablet Take 1 tablet by mouth every 12 (twelve) hours. 14 tablet Raylene Everts, MD   benzonatate (TESSALON) 200 MG capsule Take 1 capsule (200 mg total) by mouth 2 (two) times daily as needed for cough. 20 capsule Raylene Everts, MD   fluconazole (DIFLUCAN) 150 MG tablet Take 1 tablet (150 mg total) by mouth daily. Repeat in 1 week if needed 2 tablet Raylene Everts, MD   ibuprofen (ADVIL,MOTRIN) 800 MG tablet Take 1 tablet (800 mg total) by mouth 3 (three) times daily. 21 tablet Raylene Everts, MD     Controlled Substance Prescriptions [place] Controlled Substance Registry consulted? Not Applicable   Raylene Everts, MD 03/18/18 2119

## 2018-03-22 ENCOUNTER — Encounter (HOSPITAL_BASED_OUTPATIENT_CLINIC_OR_DEPARTMENT_OTHER): Payer: Self-pay

## 2018-03-22 DIAGNOSIS — R5383 Other fatigue: Secondary | ICD-10-CM

## 2018-03-22 DIAGNOSIS — G4710 Hypersomnia, unspecified: Secondary | ICD-10-CM

## 2018-04-26 NOTE — Progress Notes (Signed)
Follow-up Visit   Date: 04/29/18    Denise Cook MRN: [id number] DOB: 08-27-1975   Interim History: Denise Cook is a 43 y.o. 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  History of present illness: Starting in the fall of 2014, she began having daily headache at the base of her head, described as throbbing ache. She has nausea, photophobia, and phonophobia. She has tried toradol injection, tramadol, hydrocodone, GON block, and exedrin migraine without any relief.  MRI of the brain was suggestive of pseudotumor cerebri and was confirmed by LP (OP of 40mmHg). She saw her ophthalmologist (Dr. Nehemiah Massed) who did not find any evidence of increased IOP.  In the Spring of 2015, she had worsening headaches, despite increasing diamox so underwent second LP which showed OP 37.  Headaches were even worse than before, so diamox was increased to 1000 BID.  She continued to headaches so lasix was added and was referred to neurosurgery who did not recommend shunt due to no papilledema and vision symptoms.  About 10-days ago (soon after started lasix), headaches slowly improved to 4/10 most days, when severe 10/10 (occuring 3-times per week).    She was last seen in April 2015 for headaches related to psuedotumor cerebri and was taking Diamox 1000mg  BID and lasix 20mg  daily.  She discontinued these medications in August 2016 during which time she had aqueous shunt placed in her right eye in August 2016 due to glaucoma.  Her headaches did not significantly worsen despite stopping her medications.   In spring 2019, she began having increased heaviness of the legs. She underwent  extensive testing to evaluate her leg weakness, including MRI of the neuroaxis and NCS/EMG which was essentially normal.  She continues to have spells of leg weakness and had one fall a few weeks ago in the sitting of dizziness.  She is mostly bothered by ongoing spells of spinning sensation, provoked by positional changes.     UPDATE 04/29/2018:  She is here for 6 month follow-visit.  Overall, headaches are doing very well and she denies any new vision changes.  She is having problems with her left eye glaucoma with increasing pressures and is followed closely by ophthalmology. Her dizziness has also improved, occurring intermittently.  She was evaluated by ENT who also agreed with BPPV and given her vestibular exercises.     Medications:  Current Outpatient Medications on File Prior to Visit  Medication Sig Dispense Refill  . acetaminophen (TYLENOL) 500 MG tablet Take 1,000 mg by mouth every 6 (six) hours as needed for headache. Reported on 07/06/2015    . albuterol (PROVENTIL) (2.5 MG/3ML) 0.083% nebulizer solution Take 2.5 mg by nebulization every 6 (six) hours as needed for wheezing or shortness of breath.    . azaTHIOprine (IMURAN) 50 MG tablet Take 200 mg by mouth every evening.     . benzonatate (TESSALON) 200 MG capsule Take 1 capsule (200 mg total) by mouth 2 (two) times daily as needed for cough. 20 capsule 0  . Cholecalciferol (VITAMIN D3) 5000 units CAPS Take 5,000 Units by mouth every morning.     . dorzolamide-timolol (COSOPT) 22.3-6.8 MG/ML ophthalmic solution Place 1 drop into both eyes 2 (  two) times daily.    Marland Kitchen estradiol (VIVELLE-DOT) 0.05 MG/24HR patch Place 1 patch onto the skin 2 (two) times a week.  11  . ibuprofen (ADVIL,MOTRIN) 800 MG tablet Take 1 tablet (800 mg total) by mouth 3 (three) times daily. 21 tablet 0  . levocetirizine (XYZAL) 5 MG tablet Take 5 mg by mouth 2 (two) times daily.     . Magnesium 250 MG TABS Take 250 mg by mouth 2 (two) times daily.     . ONE TOUCH  ULTRA TEST test strip USE TO CHECK BLOOD SUGAR ONCE DAILY. ALTERNATING MORNINGS AND EVENINGS BEFORE MEALS  3  . TRULICITY 1.5 TS/1.7BL SOPN Inject 0.5 mLs into the skin every 7 (seven) days.     . DULoxetine (CYMBALTA) 30 MG capsule Take 30 mg by mouth daily.     Marland Kitchen lisdexamfetamine (VYVANSE) 50 MG capsule Take 60 mg by mouth every morning.      No current facility-administered medications on file prior to visit.     Allergies:  Allergies  Allergen Reactions  . Other     PEPPER  . Metformin And Related Itching     Review of Systems:  CONSTITUTIONAL: No fevers, chills, night sweats, weight loss.  EYES: +visual changes or eye pain ENT: No hearing changes.  No history of nose bleeds.   RESPIRATORY: +cough, wheezing and shortness of breath.   CARDIOVASCULAR: Negative for chest pain, and palpitations.   GI: Negative for abdominal discomfort, blood in stools or black stools.  No recent change in bowel habits.   GU:  No history of incontinence.   MUSCLOSKELETAL: +history of joint pain or swelling.  +myalgias.   SKIN: Negative for lesions, rash, and itching.   ENDOCRINE: Negative for cold or heat intolerance, polydipsia or goiter.   PSYCH:  No depression or anxiety symptoms.   NEURO: As Above.   Vital Signs:  BP 110/80   Pulse 68   Ht 5\' 1"  (1.549 m)   Wt 260 lb (117.9 kg)   SpO2 98%   BMI 49.13 kg/m   General Medical Exam:   General:  Well appearing, comfortable  Eyes/ENT: see cranial nerve examination.   Neck: No masses appreciated.  Full range of motion without tenderness.  No carotid bruits. Respiratory:  Clear to auscultation, good air entry bilaterally.   Cardiac:  Regular rate and rhythm, no murmur.   Ext:  No edema  Neurological Exam: MENTAL STATUS including orientation to time, place, person, recent and remote memory, attention span and concentration, language, and fund of knowledge is normal.  Speech is not dysarthric.  CRANIAL NERVES:  No evidence of papilledema.  No visual field defects. Pupils equal round and reactive to light. Iridocyclitis on left.  Normal conjugate, extra-ocular eye movements in all directions of gaze.  Mild left ptisis (old).  Face is symmetric. Palate elevates symmetrically.  Tongue is midline.  MOTOR:  Motor strength is 5/5 in all extremities. No pronator drift.  Tone is normal.    MSRs:  Reflexes are 2+/4 throughout, except 3+/4 bilateral patella jerks.    COORDINATION/GAIT:   Intact rapid alternating movements bilaterally.  Gait narrow based and stable.    Data: EMG of the left leg 05/08/2013:  This is a normal electrodiagnostic study of the right lower extremity.   MRI brain 04/24/2013:   1. Partially empty sella configuration, new since 2007. In this clinical setting consider idiopathic intracranial hypertension  (pseudotumor cerebri), although this appearance of the pituitary can be a  normal anatomic variant.  2. Otherwise largely stable and normal for age MRI appearance of the brain since 2007.   MRI brain wwo contrast 06/25/2017: 1.  No acute intracranial abnormality. 2. No abnormal intracranial enhancement or dural thickening to suggest neurosarcoidosis. Mild nonspecific subcortical cerebral white matter signal changes may have progressed since 2015. 3. Chronic partially empty sella and prominence of the optic nerve root sleeves again compatible with idiopathic intracranial hypertension (pseudotumor cerebri). 4. Mild bilateral maxillary sinus inflammation with small fluid levels is new since 2018.  CSF 05/14/2013:  OP 30   R0 W0  G53  P27, ACE neg CSF 06/24/2013:  OP 37  R1  W0  G48  P34, ACE neg  Lab Results  Component Value Date   CREATININE 1.09 (H) 02/11/2018   BUN 10 02/11/2018   NA 140 02/11/2018   K 3.5 02/11/2018   CL 107 02/11/2018   CO2 25 02/11/2018    MRI lumbar spine 08/02/2017:  Normal  MRI cervical and thoracic spine 08/19/2017:   1. No evidence of myelopathy. 2. Small midthoracic disc  protrusions. No impingement throughout the cervicothoracic canal and foramina.  NCS/EMG of the legs 08/28/2017:  This is a normal study of the lower extremities. In particular, there is no evidence of a diffuse myopathy, sensorimotor polyneuropathy, or lumbosacral radiculopathy.   IMPRESSION/PLAN 1.  Pseudotumor cerebri, diagnosed 05/2013 ( OP 30cm) - no evidence of papilledema on exam and headaches are well-controlled.  - Continue Diamox 500mg  BID - refills provided  - Avoid topiramate due to glaucoma  2.  BPPV, improved now occurring only intermittently.  OK to take meclizine 12.5mg  as needed   Return to clinic in 1 year  Thank you for allowing me to participate in patient's care.  If I can answer any additional questions, I would be pleased to do so.    Sincerely,    Guillermo Difrancesco K. Posey Pronto, DO

## 2018-04-29 ENCOUNTER — Encounter: Payer: Self-pay | Admitting: Neurology

## 2018-04-29 ENCOUNTER — Other Ambulatory Visit: Payer: Self-pay | Admitting: Neurology

## 2018-04-29 ENCOUNTER — Ambulatory Visit: Payer: BC Managed Care – PPO | Admitting: Neurology

## 2018-04-29 VITALS — BP 110/80 | HR 68 | Ht 61.0 in | Wt 260.0 lb

## 2018-04-29 DIAGNOSIS — H8110 Benign paroxysmal vertigo, unspecified ear: Secondary | ICD-10-CM

## 2018-04-29 DIAGNOSIS — G932 Benign intracranial hypertension: Secondary | ICD-10-CM | POA: Diagnosis not present

## 2018-04-29 NOTE — Patient Instructions (Addendum)
Continue diamox 500mg  twice daily  Return to clinic in 1 year

## 2018-05-05 ENCOUNTER — Emergency Department (HOSPITAL_COMMUNITY): Payer: BC Managed Care – PPO

## 2018-05-05 ENCOUNTER — Emergency Department (HOSPITAL_COMMUNITY)
Admission: EM | Admit: 2018-05-05 | Discharge: 2018-05-05 | Disposition: A | Discharge status: Home / Self Care | Payer: BC Managed Care – PPO | Attending: Emergency Medicine | Admitting: Emergency Medicine

## 2018-05-05 ENCOUNTER — Encounter (HOSPITAL_COMMUNITY): Payer: Self-pay | Admitting: Emergency Medicine

## 2018-05-05 DIAGNOSIS — M7918 Myalgia, other site: Secondary | ICD-10-CM

## 2018-05-05 DIAGNOSIS — Y929 Unspecified place or not applicable: Secondary | ICD-10-CM | POA: Diagnosis not present

## 2018-05-05 DIAGNOSIS — J45909 Unspecified asthma, uncomplicated: Secondary | ICD-10-CM | POA: Insufficient documentation

## 2018-05-05 DIAGNOSIS — M79662 Pain in left lower leg: Secondary | ICD-10-CM | POA: Diagnosis not present

## 2018-05-05 DIAGNOSIS — Y93E1 Activity, personal bathing and showering: Secondary | ICD-10-CM | POA: Insufficient documentation

## 2018-05-05 DIAGNOSIS — W010XXA Fall on same level from slipping, tripping and stumbling without subsequent striking against object, initial encounter: Secondary | ICD-10-CM | POA: Insufficient documentation

## 2018-05-05 DIAGNOSIS — S0012XA Contusion of left eyelid and periocular area, initial encounter: Secondary | ICD-10-CM | POA: Diagnosis not present

## 2018-05-05 DIAGNOSIS — M79622 Pain in left upper arm: Secondary | ICD-10-CM | POA: Insufficient documentation

## 2018-05-05 DIAGNOSIS — S0993XA Unspecified injury of face, initial encounter: Secondary | ICD-10-CM | POA: Diagnosis present

## 2018-05-05 DIAGNOSIS — E119 Type 2 diabetes mellitus without complications: Secondary | ICD-10-CM | POA: Diagnosis not present

## 2018-05-05 DIAGNOSIS — M542 Cervicalgia: Secondary | ICD-10-CM | POA: Insufficient documentation

## 2018-05-05 DIAGNOSIS — S0083XA Contusion of other part of head, initial encounter: Secondary | ICD-10-CM

## 2018-05-05 DIAGNOSIS — Z79899 Other long term (current) drug therapy: Secondary | ICD-10-CM | POA: Insufficient documentation

## 2018-05-05 DIAGNOSIS — R51 Headache: Secondary | ICD-10-CM | POA: Diagnosis not present

## 2018-05-05 DIAGNOSIS — Y999 Unspecified external cause status: Secondary | ICD-10-CM | POA: Insufficient documentation

## 2018-05-05 MED ORDER — METHOCARBAMOL 500 MG PO TABS: 1000.0000 mg | ORAL_TABLET | Freq: Four times a day (QID) | ORAL | 0 refills | Status: DC

## 2018-05-05 NOTE — ED Triage Notes (Signed)
Pt reports falling while attempting to get into her tub, states she hit her face against the tub, does not think she lost consciousness, also states she "heard something crack" c/o pain to L side of head and neck. Denies blood thinners, Pt a/ox4, resp e/u, nad.

## 2018-05-05 NOTE — ED Provider Notes (Signed)
[facility] EMERGENCY DEPARTMENT Provider Note   CSN: [id number] Arrival date & time: 05/05/18  7893     History   Chief Complaint Chief Complaint  Patient presents with  . Fall    HPI Denise Cook is a 43 y.o. female.  Patient with history of pseudotumor cerebri, uveitis presents the emergency department today with complaint of headache, facial pain, neck pain after a mechanical fall.  Patient states that she is prone to falling due to her underlying medical conditions.  Patient states that she was getting into the bathtub today when she fell and landed on her left face.  She denies any preceding lightheadedness, chest pain, shortness of breath.  Patient has pain around her left eye and into her left lateral neck.  She does have a headache.  No confusion or vomiting.  She has some tingling in her left face.  No numbness, weakness, or tingling in the arms or the legs.  No mid or lower back pain.  She is able to walk.  No treatments prior to arrival.     Past Medical History:  Diagnosis Date  . Allergy   . Asthma   . Depression   . Diabetes mellitus    diet control  . Glaucoma   . Obesity   . Pseudotumor cerebri   . Sarcoidosis 2012    Patient Active Problem List   Diagnosis Date Noted  . Pseudotumor cerebri 04/21/2013  . Leg pain, bilateral 04/15/2013    Past Surgical History:  Procedure Laterality Date  . ABDOMINAL HYSTERECTOMY    . CARDIAC CATHETERIZATION    . CHOLECYSTECTOMY    . EYE SURGERY Left    Removed vitreous membrane  . glaucoma shunt    . INCISION AND DRAINAGE Right 05/01/2014   Procedure: INCISION AND DRAINAGE RIGHT INDEX FLEXOR SHEATH  ;  Surgeon: Charlotte Crumb, MD;  Location: [facility];  Service: Orthopedics;  Laterality: Right;  . left eye surgery       OB History   No obstetric history on file.      Home Medications    Prior to Admission medications   Medication Sig Start Date End Date Taking?  Authorizing Provider  acetaminophen (TYLENOL) 500 MG tablet Take 1,000 mg by mouth every 6 (six) hours as needed for headache. Reported on 07/06/2015    [provider]  acetaZOLAMIDE (DIAMOX) 500 MG capsule TAKE 1 CAPSULE(500 MG) BY MOUTH TWICE DAILY 04/29/18   Posey Pronto, Donika K, DO  albuterol (PROVENTIL) (2.5 MG/3ML) 0.083% nebulizer solution Take 2.5 mg by nebulization every 6 (six) hours as needed for wheezing or shortness of breath.    [provider]  azaTHIOprine (IMURAN) 50 MG tablet Take 200 mg by mouth every evening.     [provider]  benzonatate (TESSALON) 200 MG capsule Take 1 capsule (200 mg total) by mouth 2 (two) times daily as needed for cough. 03/18/18   Raylene Everts, MD  Cholecalciferol (VITAMIN D3) 5000 units CAPS Take 5,000 Units by mouth every morning.     [provider]  dorzolamide-timolol (COSOPT) 22.3-6.8 MG/ML ophthalmic solution Place 1 drop into both eyes 2 (two) times daily.    [provider]  DULoxetine (CYMBALTA) 30 MG capsule Take 30 mg by mouth daily.  03/08/17 03/08/18  [provider]  estradiol (VIVELLE-DOT) 0.05 MG/24HR patch Place 1 patch onto the skin 2 (two) times a week. 01/21/18   [provider]  ibuprofen (ADVIL,MOTRIN) 800  MG tablet Take 1 tablet (800 mg total) by mouth 3 (three) times daily. 03/18/18   Raylene Everts, MD  levocetirizine (XYZAL) 5 MG tablet Take 5 mg by mouth 2 (two) times daily.     [provider]  lisdexamfetamine (VYVANSE) 50 MG capsule Take 60 mg by mouth every morning.     [provider]  Magnesium 250 MG TABS Take 250 mg by mouth 2 (two) times daily.     [provider]  ONE TOUCH ULTRA TEST test strip USE TO CHECK BLOOD SUGAR ONCE DAILY. ALTERNATING MORNINGS AND EVENINGS BEFORE MEALS 08/20/17   [provider]  TRULICITY 1.5 GX/2.1JH SOPN Inject 0.5 mLs into the skin every 7 (seven) days.  05/29/16   [provider]     Family History Family History  Adopted: Yes  Problem Relation Age of Onset  . Healthy Daughter   . Cancer Maternal Grandmother   . Alcoholism Mother   . Diabetes Paternal Grandmother     Social History Social History   Tobacco Use  . Smoking status: Never Smoker  . Smokeless tobacco: Never Used  Substance Use Topics  . Alcohol use: No  . Drug use: No     Allergies   Other and Metformin and related   Review of Systems Review of Systems  Constitutional: Negative for fatigue.  HENT: Positive for facial swelling. Negative for congestion, nosebleeds and tinnitus.   Eyes: Negative for photophobia, pain and visual disturbance.  Respiratory: Negative for shortness of breath.   Cardiovascular: Negative for chest pain.  Gastrointestinal: Negative for nausea and vomiting.  Musculoskeletal: Positive for neck pain. Negative for back pain and gait problem.  Skin: Negative for wound.  Neurological: Positive for numbness. Negative for dizziness, weakness, light-headedness and headaches.  Psychiatric/Behavioral: Negative for confusion and decreased concentration.     Physical Exam Updated Vital Signs BP (!) 124/99   Pulse 87   Temp 98.1 F (36.7 C) (Oral)   Resp 18   SpO2 100%   Physical Exam Vitals signs and nursing note reviewed.  Constitutional:      Appearance: She is well-developed.  HENT:     Head: Normocephalic. No raccoon eyes or Battle's sign.     Comments: Trace swelling and moderate tenderness with palpation around the L orbit.     Right Ear: Tympanic membrane, ear canal and external ear normal. No hemotympanum.     Left Ear: Tympanic membrane, ear canal and external ear normal. No hemotympanum.     Nose: Nose normal.     Mouth/Throat:     Pharynx: Uvula midline.  Eyes:     General: Lids are normal.     Extraocular Movements:     Right eye: No nystagmus.     Left eye: No nystagmus.     Conjunctiva/sclera: Conjunctivae normal.     Pupils: Pupils are  equal, round, and reactive to light.     Comments: No visible hyphema noted. EOMI. No signs of entrapment.   Neck:     Musculoskeletal: Normal range of motion and neck supple.  Cardiovascular:     Rate and Rhythm: Normal rate and regular rhythm.  Pulmonary:     Effort: Pulmonary effort is normal.     Breath sounds: Normal breath sounds.  Abdominal:     Palpations: Abdomen is soft.     Tenderness: There is no abdominal tenderness.  Musculoskeletal:     Cervical back: She exhibits tenderness and bony tenderness (L mid-cervical).  She exhibits normal range of motion.     Thoracic back: She exhibits no tenderness and no bony tenderness.     Lumbar back: She exhibits no tenderness and no bony tenderness.  Skin:    General: Skin is warm and dry.  Neurological:     Mental Status: She is alert and oriented to person, place, and time.     GCS: GCS eye subscore is 4. GCS verbal subscore is 5. GCS motor subscore is 6.     Cranial Nerves: No cranial nerve deficit.     Sensory: No sensory deficit.     Coordination: Coordination normal.     Deep Tendon Reflexes: Reflexes are normal and symmetric.      ED Treatments / Results  Labs (all labs ordered are listed, but only abnormal results are displayed) Labs Reviewed - No data to display  EKG None  Radiology Ct Head Wo Contrast  Result Date: 05/05/2018 CLINICAL DATA:  Fall, left head/face/neck pain EXAM: CT HEAD WITHOUT CONTRAST CT MAXILLOFACIAL WITHOUT CONTRAST CT CERVICAL SPINE WITHOUT CONTRAST TECHNIQUE: Multidetector CT imaging of the head, cervical spine, and maxillofacial structures were performed using the standard protocol without intravenous contrast. Multiplanar CT image reconstructions of the cervical spine and maxillofacial structures were also generated. COMPARISON:  CT head dated 09/27/2017 FINDINGS: CT HEAD FINDINGS Brain: No evidence of acute infarction, hemorrhage, hydrocephalus, extra-axial collection or mass lesion/mass  effect. Vascular: No hyperdense vessel or unexpected calcification. Skull: Normal. Negative for fracture or focal lesion. Other: None. CT MAXILLOFACIAL FINDINGS Osseous: No evidence of maxillofacial fracture. Orbits: Bilateral orbits, including the globes and retroconal soft tissues, are within normal limits. Sinuses: Minimal fluid in the bilateral maxillary sinuses. Visualized paranasal sinuses and mastoid air cells are otherwise clear. Soft tissues: Negative. CT CERVICAL SPINE FINDINGS Alignment: Reversal of the normal cervical lordosis. Skull base and vertebrae: No acute fracture. No primary bone lesion or focal pathologic process. Soft tissues and spinal canal: No prevertebral fluid or swelling. No visible canal hematoma. Disc levels: Vertebral body heights and intervertebral disc spaces are maintained. Spinal canal is patent. Upper chest: Visualized lung apices are clear. Other: Visualized thyroid is unremarkable. IMPRESSION: Normal head CT. Normal maxillofacial CT. Normal cervical spine CT. Electronically Signed   By: Julian Hy M.D.   On: 05/05/2018 10:47   Ct Cervical Spine Wo Contrast  Result Date: 05/05/2018 CLINICAL DATA:  Fall, left head/face/neck pain EXAM: CT HEAD WITHOUT CONTRAST CT MAXILLOFACIAL WITHOUT CONTRAST CT CERVICAL SPINE WITHOUT CONTRAST TECHNIQUE: Multidetector CT imaging of the head, cervical spine, and maxillofacial structures were performed using the standard protocol without intravenous contrast. Multiplanar CT image reconstructions of the cervical spine and maxillofacial structures were also generated. COMPARISON:  CT head dated 09/27/2017 FINDINGS: CT HEAD FINDINGS Brain: No evidence of acute infarction, hemorrhage, hydrocephalus, extra-axial collection or mass lesion/mass effect. Vascular: No hyperdense vessel or unexpected calcification. Skull: Normal. Negative for fracture or focal lesion. Other: None. CT MAXILLOFACIAL FINDINGS Osseous: No evidence of maxillofacial  fracture. Orbits: Bilateral orbits, including the globes and retroconal soft tissues, are within normal limits. Sinuses: Minimal fluid in the bilateral maxillary sinuses. Visualized paranasal sinuses and mastoid air cells are otherwise clear. Soft tissues: Negative. CT CERVICAL SPINE FINDINGS Alignment: Reversal of the normal cervical lordosis. Skull base and vertebrae: No acute fracture. No primary bone lesion or focal pathologic process. Soft tissues and spinal canal: No prevertebral fluid or swelling. No visible canal hematoma. Disc levels: Vertebral body heights and intervertebral disc spaces are maintained.  Spinal canal is patent. Upper chest: Visualized lung apices are clear. Other: Visualized thyroid is unremarkable. IMPRESSION: Normal head CT. Normal maxillofacial CT. Normal cervical spine CT. Electronically Signed   By: Julian Hy M.D.   On: 05/05/2018 10:47   Ct Maxillofacial Wo Contrast  Result Date: 05/05/2018 CLINICAL DATA:  Fall, left head/face/neck pain EXAM: CT HEAD WITHOUT CONTRAST CT MAXILLOFACIAL WITHOUT CONTRAST CT CERVICAL SPINE WITHOUT CONTRAST TECHNIQUE: Multidetector CT imaging of the head, cervical spine, and maxillofacial structures were performed using the standard protocol without intravenous contrast. Multiplanar CT image reconstructions of the cervical spine and maxillofacial structures were also generated. COMPARISON:  CT head dated 09/27/2017 FINDINGS: CT HEAD FINDINGS Brain: No evidence of acute infarction, hemorrhage, hydrocephalus, extra-axial collection or mass lesion/mass effect. Vascular: No hyperdense vessel or unexpected calcification. Skull: Normal. Negative for fracture or focal lesion. Other: None. CT MAXILLOFACIAL FINDINGS Osseous: No evidence of maxillofacial fracture. Orbits: Bilateral orbits, including the globes and retroconal soft tissues, are within normal limits. Sinuses: Minimal fluid in the bilateral maxillary sinuses. Visualized paranasal sinuses and  mastoid air cells are otherwise clear. Soft tissues: Negative. CT CERVICAL SPINE FINDINGS Alignment: Reversal of the normal cervical lordosis. Skull base and vertebrae: No acute fracture. No primary bone lesion or focal pathologic process. Soft tissues and spinal canal: No prevertebral fluid or swelling. No visible canal hematoma. Disc levels: Vertebral body heights and intervertebral disc spaces are maintained. Spinal canal is patent. Upper chest: Visualized lung apices are clear. Other: Visualized thyroid is unremarkable. IMPRESSION: Normal head CT. Normal maxillofacial CT. Normal cervical spine CT. Electronically Signed   By: Julian Hy M.D.   On: 05/05/2018 10:47    Procedures Procedures (including critical care time)  Medications Ordered in ED Medications - No data to display   Initial Impression / Assessment and Plan / ED Course  I have reviewed the triage vital signs and the nursing notes.  Pertinent labs & imaging results that were available during my care of the patient were reviewed by me and considered in my medical decision making (see chart for details).     Patient seen and examined. Work-up initiated.   Vital signs reviewed and are as follows: BP (!) 124/99   Pulse 87   Temp 98.1 F (36.7 C) (Oral)   Resp 18   SpO2 100%   11:10 AM reviewed imaging results with patient.  She is comfortable.  She has developed some mild soreness in her left upper arm and left shin area.  She can move all of her joints and we discussed this is likely due to muscle spasm or contusion.  Current plan is home with pain control with over-the-counter medications, prescription for Robaxin, use of ice or heat on the areas.  Patient verbalizes understanding and is in agreement with plan.  Final Clinical Impressions(s) / ED Diagnoses   Final diagnoses:  Contusion of face, initial encounter  Musculoskeletal pain   Patient with facial pain, neck pain, musculoskeletal pain after a fall  earlier today.  Patient states that she is prone to falling and denies any associated chest pain, shortness of breath, syncope.  She is stable during ED stay.  No signs of ocular entrapment, jaw fracture on exam.  Imaging of the head, neck, and face is unremarkable.  Suspect contusion and musculoskeletal pain.  Patient will treat conservatively.   ED Discharge Orders         Ordered    methocarbamol (ROBAXIN) 500 MG tablet  4 times daily  05/05/18 1109           Carlisle Cater, PA-C 05/05/18 1112    Lennice Sites, DO 05/05/18 [phone]

## 2018-05-05 NOTE — Discharge Instructions (Signed)
Please read and follow all provided instructions.  Your diagnoses today include:  1. Contusion of face, initial encounter   2. Musculoskeletal pain     Tests performed today include:  CT scan of your head, face, and neck that did not show any serious injury.  Vital signs. See below for your results today.   Medications prescribed:   Robaxin (methocarbamol) - muscle relaxer medication  DO NOT drive or perform any activities that require you to be awake and alert because this medicine can make you drowsy.   Take any prescribed medications only as directed.  Home care instructions:  Follow any educational materials contained in this packet.  BE VERY CAREFUL not to take multiple medicines containing Tylenol (also called acetaminophen). Doing so can lead to an overdose which can damage your liver and cause liver failure and possibly death.   Follow-up instructions: Please follow-up with your primary care provider in the next 7 days for further evaluation of your symptoms if you are not feeling better.   Return instructions:  SEEK IMMEDIATE MEDICAL ATTENTION IF:  There is confusion or drowsiness (although children frequently become drowsy after injury).   You cannot awaken the injured person.   You have more than one episode of vomiting.   You notice dizziness or unsteadiness which is getting worse, or inability to walk.   You have convulsions or unconsciousness.   You experience severe, persistent headaches not relieved by Tylenol.  You cannot use arms or legs normally.   There are changes in pupil sizes. (This is the black center in the colored part of the eye)   There is clear or bloody discharge from the nose or ears.   You have change in speech, vision, swallowing, or understanding.   Localized weakness, numbness, tingling, or change in bowel or bladder control.  You have any other emergent concerns.  Additional Information: You have had a head injury which  does not appear to require admission at this time.  Your vital signs today were: BP (!) 124/99    Pulse 87    Temp 98.1 F (36.7 C) (Oral)    Resp 18    SpO2 100%  If your blood pressure (BP) was elevated above 135/85 this visit, please have this repeated by your doctor within one month. --------------

## 2018-05-05 NOTE — ED Notes (Signed)
ED Provider at bedside. 

## 2018-05-05 NOTE — ED Notes (Signed)
Patient verbalizes understanding of discharge instructions. Opportunity for questioning and answers were provided. Armband removed by staff, pt discharged from ED.  

## 2018-05-07 ENCOUNTER — Encounter (HOSPITAL_BASED_OUTPATIENT_CLINIC_OR_DEPARTMENT_OTHER): Payer: BC Managed Care – PPO

## 2018-05-08 ENCOUNTER — Encounter (HOSPITAL_BASED_OUTPATIENT_CLINIC_OR_DEPARTMENT_OTHER): Payer: BC Managed Care – PPO

## 2018-05-23 ENCOUNTER — Other Ambulatory Visit: Payer: Self-pay | Admitting: *Deleted

## 2018-05-23 DIAGNOSIS — H8110 Benign paroxysmal vertigo, unspecified ear: Secondary | ICD-10-CM

## 2018-05-23 DIAGNOSIS — G932 Benign intracranial hypertension: Secondary | ICD-10-CM

## 2018-05-23 DIAGNOSIS — W19XXXS Unspecified fall, sequela: Secondary | ICD-10-CM

## 2018-05-26 ENCOUNTER — Ambulatory Visit (HOSPITAL_BASED_OUTPATIENT_CLINIC_OR_DEPARTMENT_OTHER): Payer: BC Managed Care – PPO | Attending: Internal Medicine | Admitting: Internal Medicine

## 2018-05-26 VITALS — Ht 61.0 in | Wt 260.0 lb

## 2018-05-26 DIAGNOSIS — G4710 Hypersomnia, unspecified: Secondary | ICD-10-CM | POA: Insufficient documentation

## 2018-05-26 DIAGNOSIS — G4733 Obstructive sleep apnea (adult) (pediatric): Secondary | ICD-10-CM | POA: Diagnosis present

## 2018-05-26 DIAGNOSIS — R5383 Other fatigue: Secondary | ICD-10-CM | POA: Diagnosis not present

## 2018-05-27 ENCOUNTER — Ambulatory Visit (HOSPITAL_BASED_OUTPATIENT_CLINIC_OR_DEPARTMENT_OTHER): Payer: BC Managed Care – PPO | Attending: Internal Medicine | Admitting: Internal Medicine

## 2018-05-27 DIAGNOSIS — R5383 Other fatigue: Secondary | ICD-10-CM | POA: Insufficient documentation

## 2018-05-27 DIAGNOSIS — G4711 Idiopathic hypersomnia with long sleep time: Secondary | ICD-10-CM | POA: Diagnosis present

## 2018-05-27 DIAGNOSIS — G4710 Hypersomnia, unspecified: Secondary | ICD-10-CM

## 2018-05-27 NOTE — Procedures (Signed)
    NAME: Denise Cook DATE OF BIRTH:  [birth year] MEDICAL RECORD NUMBER [id number]  LOCATION: [facility]  PHYSICIAN: Marius Ditch  DATE OF STUDY: 05/27/2018  SLEEP STUDY TYPE: Multiple Sleep Latency Test               REFERRING PHYSICIAN: Marius Ditch, MD  INDICATION FOR STUDY: excessive daytime sleepiness for years (since teens); PSG 07/2009 negative; HSAT 03/10/18 negative;   EPWORTH SLEEPINESS SCORE:   HEIGHT: 5\' 1"  (154.9 cm)  WEIGHT: 260 lb (117.9 kg)    Body mass index is 49.13 kg/m.  NECK SIZE: 17 in.  MEDICATIONS  Patient self administered medications include: N/A. Medications administered during study include No sleep medicine administered.Marland Kitchen   SLEEP STUDY TECHNIQUE  A multiple sleep latency test was performed. The channels recorded and monitored were central and occipital EEG, electrooculogram (EOG), submentalis EMG (chin), and electrocardiogram.   TECHNICAL COMMENTS  Comments added by Technician: NONE Comments added by Scorer: N/A   IMPRESSIONS  Pathologic sleepiness was evidenced by short mean sleep latency of 46 seconds.  No sleep onset REMs present. This study does not suggest narcolepsy. Total number of naps attempted: 5 . Total number of naps with sleep attained: 5  DIAGNOSIS  Idiopathic hypersomnia (327.11 [G47.11 ICD-10])  RECOMMENDATIONS  Return for follow up and management of Idiopathic Hypersomnia.    Marius Ditch Sleep specialist, [place] [employer]  ELECTRONICALLY SIGNED ON:  05/27/2018, 9:35 PM [place] PH: [phone]   FX: [phone] [place]

## 2018-05-27 NOTE — Procedures (Signed)
   NAME: Denise Cook DATE OF BIRTH:  01-Jan-1976 MEDICAL RECORD NUMBER [id number]  LOCATION: [facility]  PHYSICIAN: Marius Ditch  DATE OF STUDY: 05/26/2018  SLEEP STUDY TYPE: Nocturnal Polysomnogram               REFERRING PHYSICIAN: Marius Ditch, MD  INDICATION FOR STUDY: excessive daytime sleepiness for years (since teens); PSG 07/2009 negative; HSAT 03/10/18 negative; this test done prior to scheduled MSLT  EPWORTH SLEEPINESS SCORE:   HEIGHT: 5\' 1"  (154.9 cm)  WEIGHT: 260 lb (117.9 kg)    Body mass index is 49.13 kg/m.  NECK SIZE: 17 in.  MEDICATIONS  Patient self administered medications include: N/A. Medications administered during study include No sleep medicine administered.Marland Kitchen   SLEEP STUDY TECHNIQUE  A multi-channel overnight Polysomnography study was performed. The channels recorded and monitored were central and occipital EEG, electrooculogram (EOG), submentalis EMG (chin), nasal and oral airflow, thoracic and abdominal wall motion, anterior tibialis EMG, snore microphone, electrocardiogram, and a pulse oximetry.   TECHNICAL COMMENTS  Comments added by Technician: NONE Comments added by Scorer: N/A   SLEEP ARCHITECTURE  The study was initiated at 10:29:08 PM and terminated at 6:00:01 AM. The total recorded time was 450.9 minutes. EEG confirmed total sleep time was 420.8 minutes yielding a sleep efficiency of 93.3%%. Sleep onset after lights out was 2.6 minutes with a REM latency of 110.5 minutes. The patient spent 2.1%% of the night in stage N1 sleep, 71.7%% in stage N2 sleep, 1.8%% in stage N3 and 24.4% in REM. Wake after sleep onset (WASO) was 27.5 minutes. The Arousal Index was 13.0/hour.   RESPIRATORY PARAMETERS  There were a total of 47 respiratory disturbances out of which 0 were apneas ( 0 obstructive, 0 mixed, 0 central) and 47 hypopneas. The apnea/hypopnea index (AHI) was 6.7 events/hour. The central sleep apnea index was 0.0  events/hour. The REM AHI was 20.5 events/hour and NREM AHI was 2.3 events/hour. RDI was 8/hr and REM RDI was 24/hr. Respiratory disturbances were associated with oxygen desaturation down to a nadir of 87.0% during sleep. The mean oxygen saturation during the study was 95.2%. The cumulative time under 88% oxygen saturation was 5.5 minutes.  LEG MOVEMENT DATA  The total leg movements were 14 with a resulting leg movement index of 2.0/hr . Associated arousal with leg movement index was 1.9/hr.   CARDIAC DATA  The underlying cardiac rhythm was most consistent with sinus rhythm. Mean heart rate during sleep was 86.7 bpm. Additional rhythm abnormalities include None.   IMPRESSIONS  Mild Obstructive Sleep apnea(OSA), worse in REM sleep.   DIAGNOSIS  Obstructive Sleep Apnea (327.23 [G47.33 ICD-10]  RECOMMENDATIONS  Very mild obstructive sleep apnea. May proceed with MSLT.   Marius Ditch Sleep specialist, [place] Board [employer]  ELECTRONICALLY SIGNED ON:  05/27/2018, 9:27 PM [place] PH: [phone]   FX: [phone] [place]

## 2018-05-30 ENCOUNTER — Ambulatory Visit (INDEPENDENT_AMBULATORY_CARE_PROVIDER_SITE_OTHER): Payer: BC Managed Care – PPO | Admitting: Neurology

## 2018-05-30 DIAGNOSIS — W19XXXS Unspecified fall, sequela: Secondary | ICD-10-CM

## 2018-05-30 DIAGNOSIS — H8110 Benign paroxysmal vertigo, unspecified ear: Secondary | ICD-10-CM

## 2018-05-30 DIAGNOSIS — G932 Benign intracranial hypertension: Secondary | ICD-10-CM | POA: Diagnosis not present

## 2018-06-04 NOTE — Procedures (Signed)
ELECTROENCEPHALOGRAM REPORT  Date of Study: 05/30/2018  Patient's Name: Denise Cook MRN: [id number] Date of Birth: February 04, 1976  Referring Provider: Dr. Narda Amber  Clinical History: This is a 43 year old woman with recurrent spells of leg weakness leading to falls, falling asleep during the day.  Medications: TYLENOL 500 MG tablet  PROVENTIL (2.5 MG/3ML) 0.083% nebulizer solution   IMURAN 50 MG tablet   TESSALON 200 MG capsule VITAMIN D3 5000 units CAPS  COSOPT 22.3-6.8 MG/ML ophthalmic solution    VIVELLE-DOT 0.05 MG/24HR patch   ADVIL,MOTRIN 800 MG tablet  XYZAL 5 MG tablet   Magnesium 567 MG TABS  TRULICITY 1.5 OL/4.1CV SOPN  CYMBALTA 30 MG capsule  VYVANSE 50 MG capsule   Technical Summary: A multichannel digital EEG recording measured by the international 10-20 system with electrodes applied with paste and impedances below 5000 ohms performed in our laboratory with EKG monitoring in an awake and asleep patient.  Hyperventilation and photic stimulation were performed.  The digital EEG was referentially recorded, reformatted, and digitally filtered in a variety of bipolar and referential montages for optimal display.    Description: The patient is awake and asleep during the recording.  During maximal wakefulness, there is a poorly sustained 10 Hz posterior dominant rhythm. The record is symmetric.  During drowsiness and sleep, there is an increase in theta slowing of the background.  Vertex waves and symmetric sleep spindles were seen.  Hyperventilation and photic stimulation did not elicit any abnormalities.  There were no epileptiform discharges or electrographic seizures seen.    EKG lead was unremarkable.  Impression: This awake and asleep EEG is normal.    Clinical Correlation: A normal EEG does not exclude a clinical diagnosis of epilepsy.  If further clinical questions remain, prolonged EEG may be helpful.  Clinical correlation is advised.   Ellouise Newer, M.D.

## 2018-06-25 ENCOUNTER — Other Ambulatory Visit (HOSPITAL_COMMUNITY): Payer: Self-pay | Admitting: Family Medicine

## 2018-06-25 ENCOUNTER — Other Ambulatory Visit: Payer: Self-pay

## 2018-06-25 ENCOUNTER — Ambulatory Visit (HOSPITAL_COMMUNITY)
Admission: RE | Admit: 2018-06-25 | Discharge: 2018-06-25 | Disposition: A | Discharge status: Home / Self Care | Payer: BC Managed Care – PPO | Source: Ambulatory Visit | Attending: Family Medicine | Admitting: Family Medicine

## 2018-06-25 DIAGNOSIS — R0602 Shortness of breath: Secondary | ICD-10-CM | POA: Insufficient documentation

## 2018-07-24 ENCOUNTER — Telehealth: Payer: BC Managed Care – PPO | Admitting: Family

## 2018-07-24 DIAGNOSIS — R0600 Dyspnea, unspecified: Secondary | ICD-10-CM

## 2018-07-24 NOTE — Progress Notes (Signed)
Based on what you shared with me, I feel your condition warrants further evaluation and I recommend that you be seen for a face to face office visit.     NOTE: If you entered your credit card information for this eVisit, you will not be charged. You may see a "hold" on your card for the $35 but that hold will drop off and you will not have a charge processed.  If you are having a true medical emergency please call 911.  If you need an urgent face to face visit, [facility] has four urgent care centers for your convenience.    PLEASE NOTE: THE [facility] LOCATIONS AND URGENT CARE CLINICS DO NOT HAVE THE TESTING FOR CORONAVIRUS COVID19 AVAILABLE.  IF YOU FEEL YOU NEED THIS TEST YOU MUST GO TO A TRIAGE LOCATION AT [facility]   [email or web address] to reserve your spot online an avoid wait times  [facility] [address] [place], [place] [address] Modified hours of operation: Monday-Friday, 10 AM to 6 PM  Saturday & Sunday 10 AM to 4 PM *Across the street from [place] (New Address!) [address], [place], [place] [address] *Just off [address], across the road from [place] hours of operation: Monday-Friday, 10 AM to 5 PM  Closed Saturday & Sunday   The following sites will take your insurance:  . [facility] [place] a Provider at this Location  [address] [place], [place] [address] . 10 am to 8 pm Monday-Friday . 12 pm to 8 pm Saturday-Sunday   . [facility] at [place] a Provider at this Location  [address], [place], [place] [address] . 8 am to 8 pm Monday-Friday . 9 am to 6 pm Saturday . 11 am to 6 pm Sunday   . [facility] at [place] Get Driving Directions  [address] [place], [place] [address] . 8 am to 8 pm Monday-Friday . 8 am to 4 pm Saturday-Sunday   Your e-visit answers were reviewed by a board certified advanced clinical practitioner to complete your personal care plan.  Thank you for using e-Visits.  Greater than 5 minutes, yet less than 10 minutes of time have been spent researching, coordinating, and implementing care for this patient today.  Thank you for the details you included in the comment boxes. Those details are very helpful in determining the best course of treatment for you and help [place] to provide the best care.

## 2018-07-26 ENCOUNTER — Ambulatory Visit
Admission: RE | Admit: 2018-07-26 | Discharge: 2018-07-26 | Disposition: A | Discharge status: Home / Self Care | Payer: BC Managed Care – PPO | Source: Ambulatory Visit | Attending: Family Medicine | Admitting: Family Medicine

## 2018-07-26 ENCOUNTER — Other Ambulatory Visit: Payer: Self-pay | Admitting: Family Medicine

## 2018-07-26 DIAGNOSIS — R0602 Shortness of breath: Secondary | ICD-10-CM

## 2018-07-26 DIAGNOSIS — J45909 Unspecified asthma, uncomplicated: Secondary | ICD-10-CM

## 2018-07-26 DIAGNOSIS — D869 Sarcoidosis, unspecified: Secondary | ICD-10-CM

## 2018-09-12 DIAGNOSIS — R202 Paresthesia of skin: Secondary | ICD-10-CM | POA: Insufficient documentation

## 2018-09-20 DIAGNOSIS — H43813 Vitreous degeneration, bilateral: Secondary | ICD-10-CM | POA: Insufficient documentation

## 2018-09-20 DIAGNOSIS — R7303 Prediabetes: Secondary | ICD-10-CM | POA: Insufficient documentation

## 2019-01-10 ENCOUNTER — Encounter: Payer: Self-pay | Admitting: Neurology

## 2019-04-02 IMAGING — CT CT ABD-PELV W/ CM
2 of 5 series · 16 of 46 positions shown, 18 images · IV contrast (ISOVUE)
Comparison: MRI of the lumbar spine performed 08/02/2017, and CT of
the abdomen and pelvis performed 12/07/2006

CLINICAL DATA: Acute onset of epigastric abdominal pain and
burning. Pain radiates to the right side of the back. Nausea.

EXAM:
CT ABDOMEN AND PELVIS WITH CONTRAST
TECHNIQUE: Multidetector CT imaging of the abdomen and pelvis was performed
using the standard protocol following bolus administration of
intravenous contrast.
CONTRAST:  100mL SOCY4B-5PP IOPAMIDOL (SOCY4B-5PP) INJECTION 61%

[Series 2: axial st · axial · 0.68mm/px · z∈[+1195,+1550]mm · 13 of 83 slices shown, 15 images]
[im 6/83  soft-tissue]
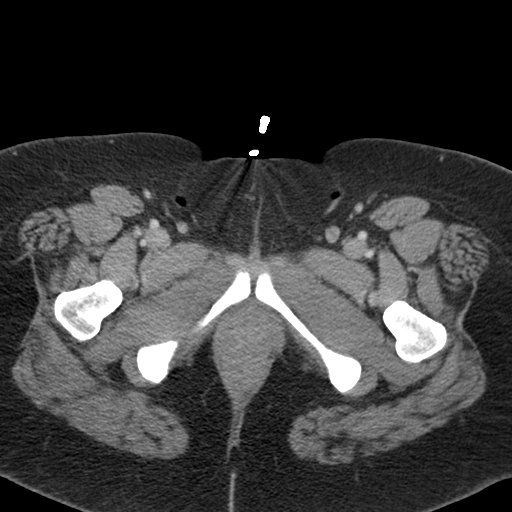
[im 6/83  bone]
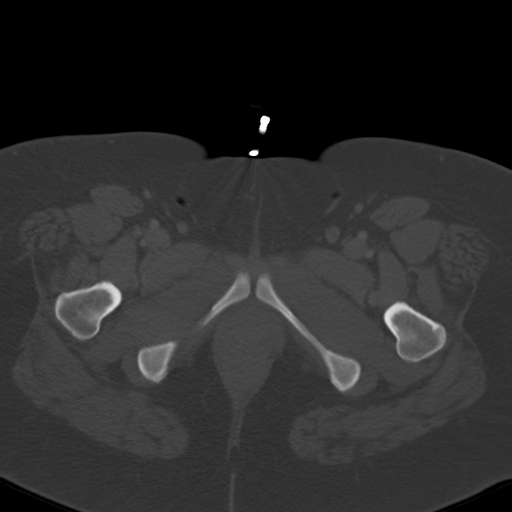
[im 11/83  soft-tissue]
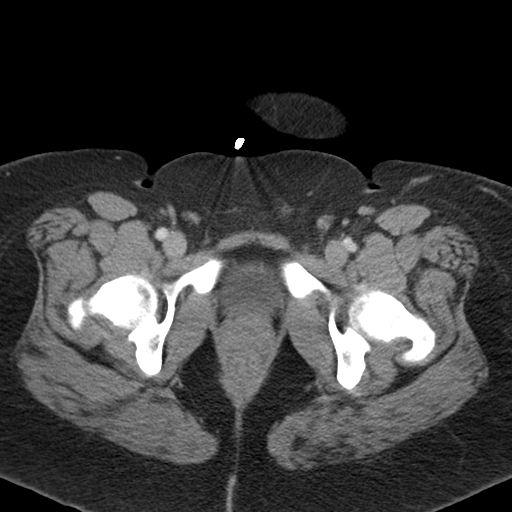
[im 17/83  soft-tissue]
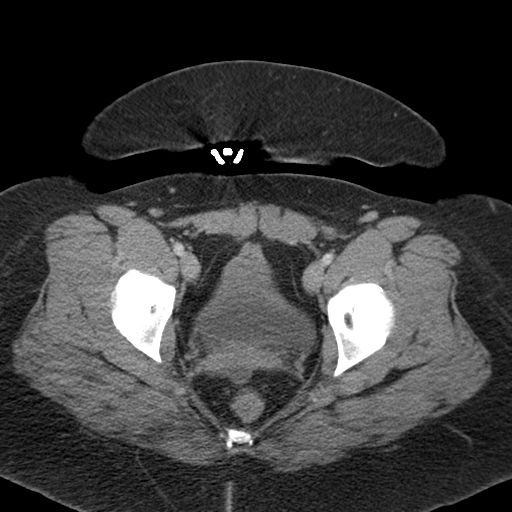
[im 22/83  soft-tissue]
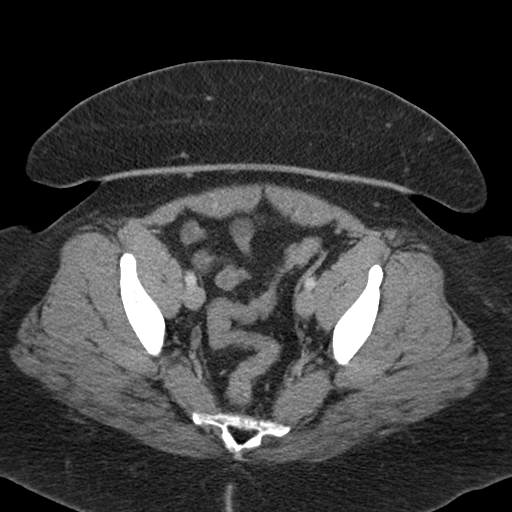
[im 28/83  soft-tissue]
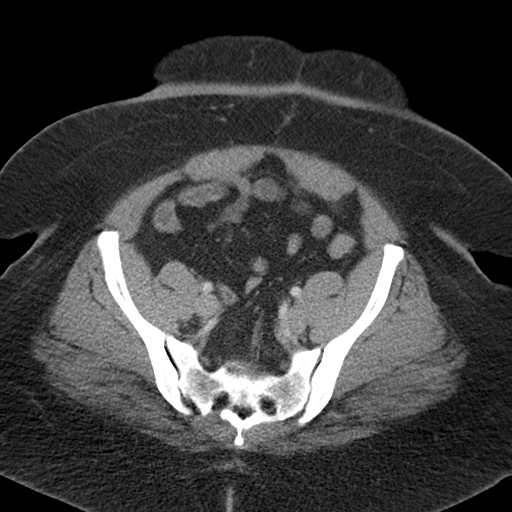
[im 33/83  soft-tissue]
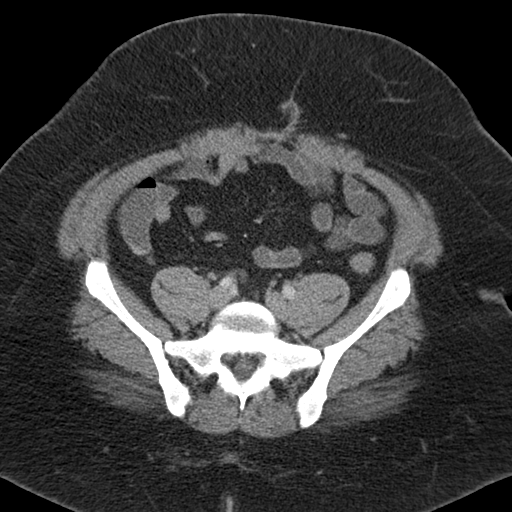
[im 44/83  soft-tissue]
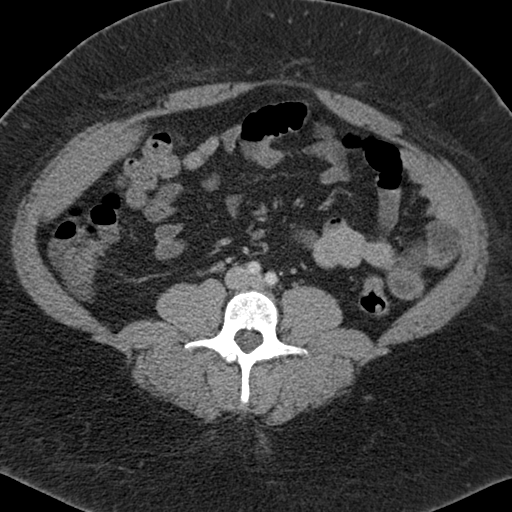
[im 50/83  soft-tissue]
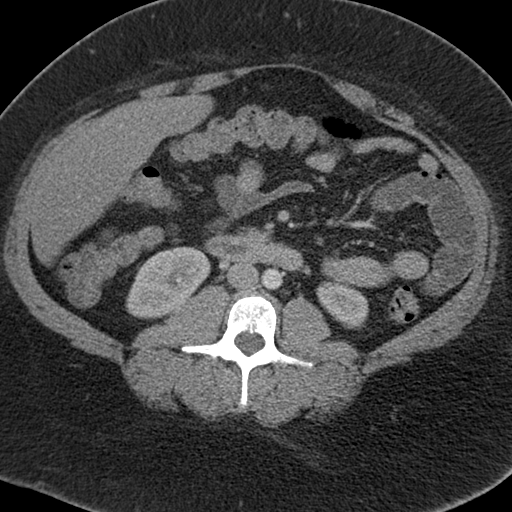
[im 55/83  soft-tissue]
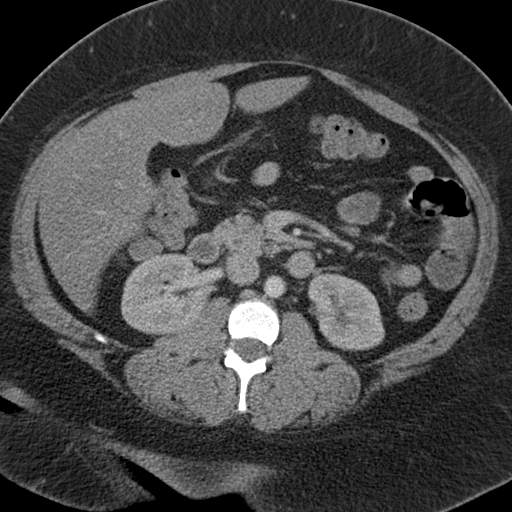
[im 55/83  bone]
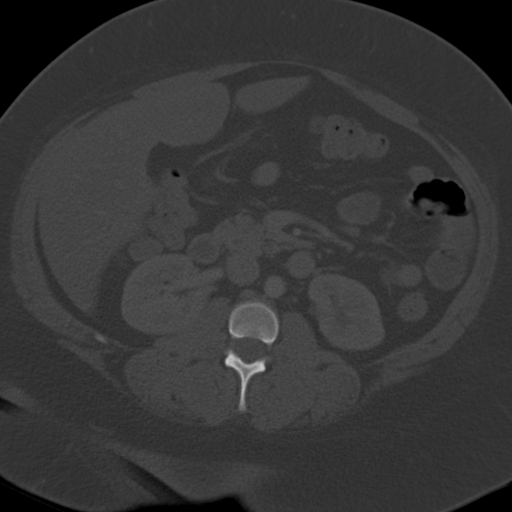
[im 61/83  soft-tissue]
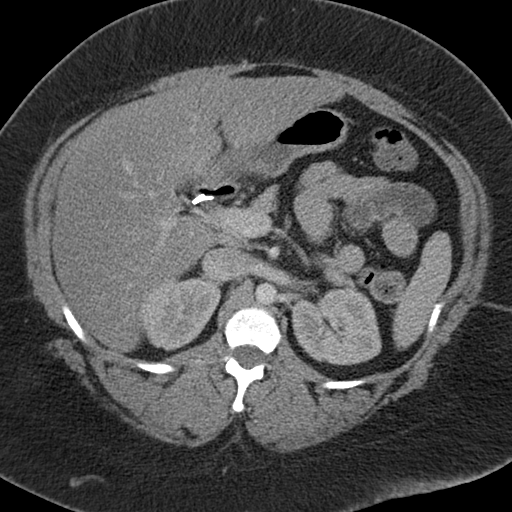
[im 66/83  soft-tissue]
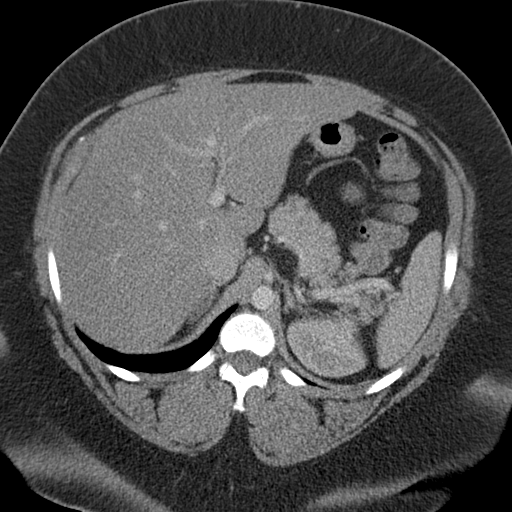
[im 72/83  soft-tissue]
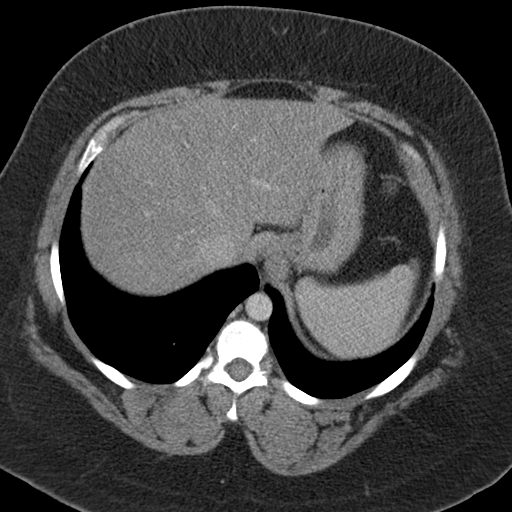
[im 77/83  soft-tissue]
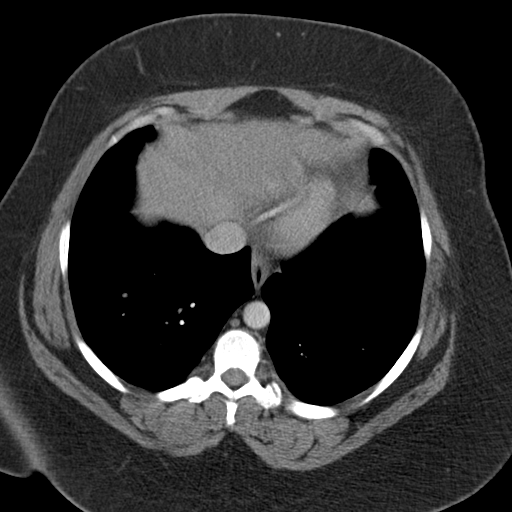

[Series 5: coronal st · coronal · 0.69mm/px · 3 of 101 slices shown]
[im 34/101  soft-tissue]
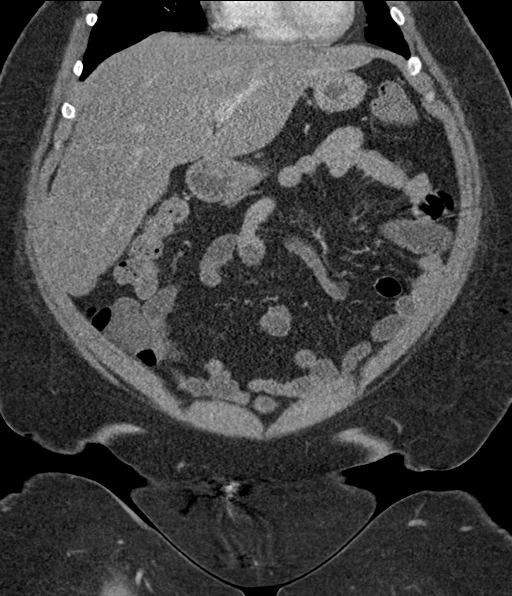
[im 45/101  soft-tissue]
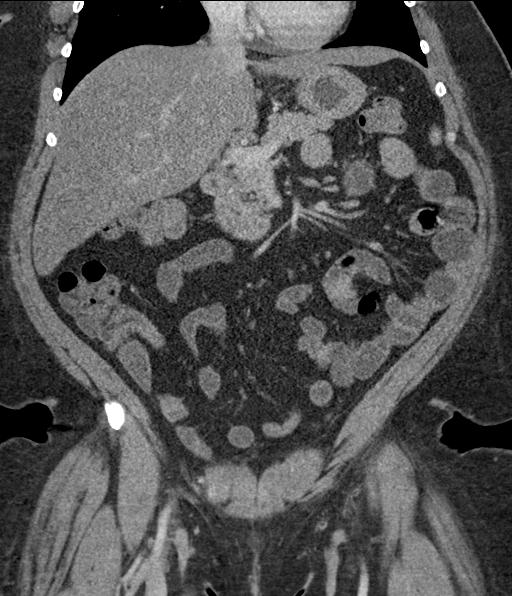
[im 56/101  soft-tissue]
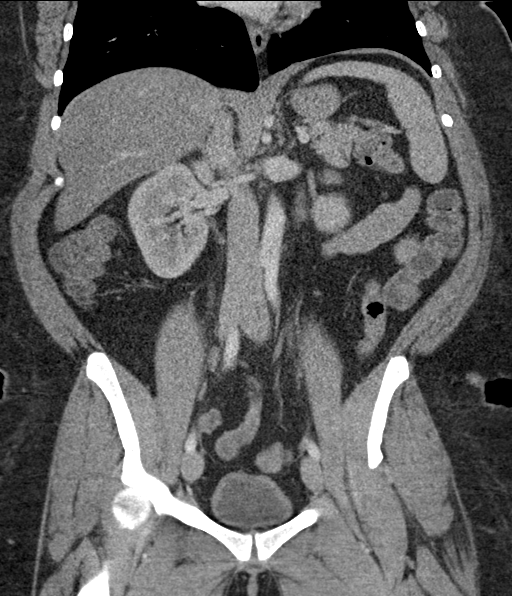

[16 of 46 positions shown; findings below may reference images not displayed]

FINDINGS: Lower chest: Minimal bibasilar atelectasis is noted. The visualized
portions of the mediastinum are unremarkable.

Hepatobiliary: The liver is unremarkable in appearance. The patient
is status post cholecystectomy, with clips noted at the gallbladder
fossa. The common bile duct remains normal in caliber.

Pancreas: The pancreas is within normal limits.

Spleen: The spleen is unremarkable in appearance.

Adrenals/Urinary Tract: The adrenal glands are unremarkable in
appearance. The kidneys are within normal limits. There is no
evidence of hydronephrosis. No renal or ureteral stones are
identified. No perinephric stranding is seen.

Stomach/Bowel: The stomach is unremarkable in appearance. The small
bowel is within normal limits. The appendix is normal in caliber,
without evidence of appendicitis. The colon is unremarkable in
appearance.

Vascular/Lymphatic: The abdominal aorta is unremarkable in
appearance. The inferior vena cava is grossly unremarkable. No
retroperitoneal lymphadenopathy is seen. No pelvic sidewall
lymphadenopathy is identified.

Reproductive: The bladder is mildly distended and grossly
unremarkable. The patient is status post hysterectomy. No suspicious
adnexal masses are seen.

Other: No additional soft tissue abnormalities are seen.

Musculoskeletal: No acute osseous abnormalities are identified. The
visualized musculature is unremarkable in appearance.
IMPRESSION: Unremarkable contrast-enhanced CT of the abdomen and pelvis.

## 2019-04-21 ENCOUNTER — Ambulatory Visit: Payer: 59 | Admitting: Neurology

## 2019-04-21 ENCOUNTER — Other Ambulatory Visit: Payer: Self-pay

## 2019-04-21 ENCOUNTER — Encounter: Payer: Self-pay | Admitting: Neurology

## 2019-04-21 VITALS — HR 65 | Ht 61.5 in | Wt 300.0 lb

## 2019-04-21 DIAGNOSIS — H8110 Benign paroxysmal vertigo, unspecified ear: Secondary | ICD-10-CM | POA: Diagnosis not present

## 2019-04-21 DIAGNOSIS — G932 Benign intracranial hypertension: Secondary | ICD-10-CM

## 2019-04-21 NOTE — Progress Notes (Signed)
Follow-up Visit   Date: 04/21/19    Denise [place] MRN: [id number] DOB: 1975-12-23   Interim History: Denise Cook is a 44 y.o. 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  History of present illness: Starting in the fall of 2014, she began having daily headache at the base of her head, described as throbbing ache. She has nausea, photophobia, and phonophobia. She has tried toradol injection, tramadol, hydrocodone, GON block, and exedrin migraine without any relief.  MRI of the brain was suggestive of pseudotumor cerebri and was confirmed by LP (OP of 323mg). She saw her ophthalmologist (Dr. KoNehemiah Massedwho did not find papilledema.  In the Spring of 2015, she had worsening headaches, despite increasing diamox so underwent second LP which showed OP 37.  Headaches were even worse than before, so diamox was increased to 1000 BID.  She continued to headaches so lasix was added and was referred to neurosurgery who did not recommend shunt due to no papilledema and vision symptoms.    She was last seen in April 2015 for headaches related to psuedotumor cerebri and was taking Diamox 100088mID and lasix 26m39mily.  She discontinued these medications in August 2016 during which time she had aqueous shunt placed in her right eye in August 2016 due to glaucoma.  Her headaches did not significantly worsen despite stopping her medications.   In spring 2019, she began having increased heaviness of the legs. She underwent extensive testing to evaluate her leg weakness, including MRI of the neuroaxis and NCS/EMG which was essentially normal.  She was also having vertigo and seeing ENT for this.  UPDATE 04/21/2019:  She is here for in-office visit due to  worsening headaches.  Headache started in December with associated blurred vision, dizziness, and spells of slurred speech.  She feels like she needs LP because headaches are similar to what she has experienced in the past. She has known pseudotumor and has been managed diamox 500mg40m over the past two years.  In the past, she has been on combination of high dose diamox, topiramate, and lasix.  At one point, I referred her for VP shunt, but she was not deemed a candidate due to absence of papilledema.  Over the past 1-2 years, pseudotumor cerebri has been doing much better and she has been on diamox 500mg 5m   She has gained 50-60lb over the past 2 years.   Of note, she [facility] of leg weakness which I had extensively evaluated with imaging of the neuroaxis, EMG, and EEG which was unremarkable.    Medications:  Current Outpatient Medications on File Prior to Visit  Medication Sig Dispense Refill  . Adalimumab 40 MG/0.4ML PNKT Inject into the skin.    . azaTMarland KitchenIOprine (IMURAN) 50 MG tablet Take by mouth.    . Blood Glucose Monitoring Suppl (FIFTY50 GLUCOSE METER 2.0) w/Device KIT USE TO CHECK BLOOD SUGAR ONCE DAILY. ALTERNATING MORNINGS AND EVENINGS BEFORE MEALS    . brimonidine-timolol (COMBIGAN) 0.2-0.5 % ophthalmic solution Apply to eye.    . acetMarland Kitchenminophen (TYLENOL) 500 MG tablet Take 1,000 mg by mouth every 6 (six) hours as needed for headache. Reported on 07/06/2015    . acetaZOLAMIDE (DIAMOX) 500 MG  capsule TAKE 1 CAPSULE(500 MG) BY MOUTH TWICE DAILY (Patient taking differently: Take 500 mg by mouth 2 (two) times daily. ) 180 capsule 3  . albuterol (PROVENTIL HFA;VENTOLIN HFA) 108 (90 Base) MCG/ACT inhaler Inhale 2 puffs into the lungs every 6 (six) hours as needed for wheezing or shortness of breath.    Marland Kitchen albuterol (VENTOLIN HFA) 108 (90 Base) MCG/ACT inhaler Inhale into the lungs.    . ARIPiprazole (ABILIFY) 5 MG tablet Take 5 mg by mouth  daily.    . Armodafinil 250 MG tablet     . azaTHIOprine (IMURAN) 50 MG tablet Take 200 mg by mouth every evening.     . budesonide-formoterol (SYMBICORT) 80-4.5 MCG/ACT inhaler Inhale into the lungs.    . Cholecalciferol (VITAMIN D3) 5000 units CAPS Take 5,000 Units by mouth every morning.     . dorzolamide-timolol (COSOPT) 22.3-6.8 MG/ML ophthalmic solution Place 1 drop into both eyes 2 (two) times daily.    . DULoxetine (CYMBALTA) 30 MG capsule Take 30 mg by mouth daily.     . DULoxetine (CYMBALTA) 60 MG capsule Take 60 mg by mouth daily.    Marland Kitchen estradiol (VIVELLE-DOT) 0.05 MG/24HR patch Place 1 patch onto the skin 2 (two) times a week.  11  . ibuprofen (ADVIL,MOTRIN) 800 MG tablet Take 1 tablet (800 mg total) by mouth 3 (three) times daily. 21 tablet 0  . levocetirizine (XYZAL) 5 MG tablet Take 5 mg by mouth 2 (two) times daily.     Marland Kitchen lisdexamfetamine (VYVANSE) 50 MG capsule Take 60 mg by mouth every morning.     . Magnesium 250 MG TABS Take 250 mg by mouth 2 (two) times daily.     . methocarbamol (ROBAXIN) 500 MG tablet Take 2 tablets (1,000 mg total) by mouth 4 (four) times daily. 20 tablet 0  . montelukast (SINGULAIR) 10 MG tablet Take by mouth.    . ondansetron (ZOFRAN) 8 MG tablet Take 4-8 mg by mouth every 8 (eight) hours as needed.    . ONE TOUCH ULTRA TEST test strip USE TO CHECK BLOOD SUGAR ONCE DAILY. ALTERNATING MORNINGS AND EVENINGS BEFORE MEALS  3  . TRULICITY 1.5 HE/1.7EY SOPN Inject 0.5 mLs into the skin every 7 (seven) days.      No current facility-administered medications on file prior to visit.    Allergies:  Allergies  Allergen Reactions  . Other     PEPPER  . Metformin And Related Itching    Vital Signs:  Pulse 65   Ht 5' 1.5" (1.562 m)   Wt 300 lb (136.1 kg)   SpO2 97%   BMI 55.77 kg/m   Neurological Exam: MENTAL STATUS including orientation to time, place, person, recent and remote memory, attention span and concentration, language, and fund of  knowledge is normal.  Speech is not dysarthric.  CRANIAL NERVES:  Limited fundoscopic exam, I do not appreciate papilledema. No visual field defects. Pupils equal round and reactive to light. Normal conjugate, extra-ocular eye movements in all directions of gaze.  Mild left ptisis (old).  Face is symmetric. Palate elevates symmetrically.  Tongue is midline.  MOTOR:  Motor strength is 5/5 in all extremities. No pronator drift.  Tone is normal.    MSRs:  Reflexes are 2+/4 throughout, except 3+/4 bilateral patella jerks.    COORDINATION/GAIT:   Intact rapid alternating movements bilaterally.  Gait narrow mildly wide-based, stable, unassisted.    Data: EMG of the left leg 05/08/2013:  This is a normal electrodiagnostic  study of the right lower extremity.   MRI brain 04/24/2013:   1. Partially empty sella configuration, new since 2007. In this clinical setting consider idiopathic intracranial hypertension  (pseudotumor cerebri), although this appearance of the pituitary can be a normal anatomic variant.  2. Otherwise largely stable and normal for age MRI appearance of the brain since 2007.   MRI brain wwo contrast 06/25/2017: 1.  No acute intracranial abnormality. 2. No abnormal intracranial enhancement or dural thickening to suggest neurosarcoidosis. Mild nonspecific subcortical cerebral white matter signal changes may have progressed since 2015. 3. Chronic partially empty sella and prominence of the optic nerve root sleeves again compatible with idiopathic intracranial hypertension (pseudotumor cerebri). 4. Mild bilateral maxillary sinus inflammation with small fluid levels is new since 2018.  CSF 05/14/2013:  OP 30   R0 W0  G53  P27, ACE neg CSF 06/24/2013:  OP 37  R1  W0  G48  P34, ACE neg CSF 07/05/2017:  OP 22, 20CC removed 12 CP  MRI lumbar spine 08/02/2017:  Normal  MRI cervical and thoracic spine 08/19/2017:   1. No evidence of myelopathy. 2. Small midthoracic disc protrusions. No  impingement throughout the cervicothoracic canal and foramina.  NCS/EMG of the legs 08/28/2017:  This is a normal study of the lower extremities. In particular, there is no evidence of a diffuse myopathy, sensorimotor polyneuropathy, or lumbosacral radiculopathy.  Routine EEG 05/30/2018:  Normal  Lab Results  Component Value Date   CREATININE 1.09 (H) 02/11/2018   BUN 10 02/11/2018   NA 140 02/11/2018   K 3.5 02/11/2018   CL 107 02/11/2018   CO2 25 02/11/2018     IMPRESSION/PLAN 1.  Pseudotumor cerebri, diagnosed 05/2013 (OP 30 cm).  She presents today with worsening headache, blurred vision  - Diagnostic and therapeutic large volume LP under IR for OP  - Continue Diamox 579m BID.  If pressure is elevated, dose will need to be increased.   - Avoid topiramate due to glaucoma  2.  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  3.  Slurred speech, no evidence of this on exam  Lastly, I acknowledged a letter patient sent expressing her dissatisfaction with the care that I have provided.  In the best interest of patient care, I recommend she establish care with a new neurologist.  I will provide her with 90-day supply of medication and forward my records to her new neurologist.   Thank you for allowing me to participate in patient's care.  If I can answer any additional questions, I would be pleased to do so.    Sincerely,    Aul Mangieri K. PPosey Pronto DO

## 2019-04-21 NOTE — Patient Instructions (Addendum)
We will order lumbar puncture  I will send 90-day supple of diamox to your pharmacy  Please follow-up with Dr. Nestor Ramp or other neurologist of your choice for ongoing care

## 2019-04-22 MED ORDER — ACETAZOLAMIDE ER 500 MG PO CP12: 500.0000 mg | ORAL_CAPSULE | Freq: Two times a day (BID) | ORAL | 0 refills | Status: DC

## 2019-04-23 ENCOUNTER — Telehealth: Payer: Self-pay | Admitting: Neurology

## 2019-04-23 ENCOUNTER — Encounter: Payer: Self-pay | Admitting: Neurology

## 2019-04-23 NOTE — Telephone Encounter (Signed)
Pharmacy is aware

## 2019-04-23 NOTE — Telephone Encounter (Signed)
Pharm is calling in about the diamox medication- for the strength sent in it only comes in the Ext release and they want to make sure that is okay. Thanks!

## 2019-04-23 NOTE — Telephone Encounter (Signed)
Please advise on below  

## 2019-04-23 NOTE — Telephone Encounter (Signed)
Yes, that is okay.

## 2019-04-26 ENCOUNTER — Ambulatory Visit: Payer: 59 | Attending: Internal Medicine

## 2019-04-26 DIAGNOSIS — Z23 Encounter for immunization: Secondary | ICD-10-CM | POA: Insufficient documentation

## 2019-04-29 ENCOUNTER — Other Ambulatory Visit: Payer: Self-pay | Admitting: Neurology

## 2019-04-29 ENCOUNTER — Ambulatory Visit
Admission: RE | Admit: 2019-04-29 | Discharge: 2019-04-29 | Disposition: A | Discharge status: Home / Self Care | Payer: 59 | Source: Ambulatory Visit | Attending: Neurology | Admitting: Neurology

## 2019-04-29 ENCOUNTER — Other Ambulatory Visit: Payer: Self-pay

## 2019-04-29 VITALS — BP 205/126 | HR 76

## 2019-04-29 DIAGNOSIS — G4711 Idiopathic hypersomnia with long sleep time: Secondary | ICD-10-CM | POA: Insufficient documentation

## 2019-04-29 DIAGNOSIS — G932 Benign intracranial hypertension: Secondary | ICD-10-CM

## 2019-04-29 DIAGNOSIS — F319 Bipolar disorder, unspecified: Secondary | ICD-10-CM | POA: Insufficient documentation

## 2019-04-29 LAB — CSF CELL COUNT WITH DIFFERENTIAL
RBC Count, CSF: 3 cells/uL — ABNORMAL HIGH
WBC, CSF: 1 cells/uL (ref 0–5)

## 2019-04-29 LAB — PROTEIN, CSF: Total Protein, CSF: 40 mg/dL (ref 15–45)

## 2019-04-29 LAB — GLUCOSE, CSF: Glucose, CSF: 77 mg/dL (ref 40–80)

## 2019-04-29 NOTE — Discharge Instructions (Signed)

## 2019-04-30 ENCOUNTER — Ambulatory Visit: Payer: Self-pay | Admitting: Neurology

## 2019-04-30 NOTE — Progress Notes (Signed)
Patient called, but there was no answer so message was left on voicemail.  Informed patient that CSF analysis is normal, OP was mildly elevated and adequate fluid was removed.  Continue Diamox 500mg  BID.

## 2019-05-01 ENCOUNTER — Telehealth: Payer: BC Managed Care – PPO | Admitting: Neurology

## 2019-05-02 ENCOUNTER — Telehealth: Payer: Self-pay | Admitting: Neurology

## 2019-05-02 NOTE — Telephone Encounter (Signed)
Patient dismissed from [facility] by Narda Amber DO, effective 04/30/19. Dismissal Letter sent out by 1st class mail. [employer]

## 2019-05-24 ENCOUNTER — Ambulatory Visit: Payer: 59

## 2019-05-31 ENCOUNTER — Ambulatory Visit: Payer: 59 | Attending: Internal Medicine

## 2019-05-31 ENCOUNTER — Other Ambulatory Visit: Payer: Self-pay

## 2019-05-31 DIAGNOSIS — Z23 Encounter for immunization: Secondary | ICD-10-CM | POA: Insufficient documentation

## 2019-05-31 NOTE — Progress Notes (Signed)
   Covid-19 Vaccination Clinic  Name:  Denise [facility]    MRN: [id number] DOB: 01-22-1976  05/31/2019  [name] was observed post Covid-19 immunization for 15 minutes without incidence. She was provided with Vaccine Information Sheet and instruction to access the V-Safe system.   [name] was instructed to call 911 with any severe reactions post vaccine: Marland Kitchen Difficulty breathing  . Swelling of your face and throat  . A fast heartbeat  . A bad rash all over your body  . Dizziness and weakness    Immunizations Administered    Name Date Dose VIS Date Route   Moderna COVID-19 Vaccine 05/31/2019  9:37 AM 0.5 mL 03/04/2019 Intramuscular   Manufacturer: Moderna   Lot: [id number]   [id number]

## 2019-07-22 ENCOUNTER — Other Ambulatory Visit: Payer: Self-pay | Admitting: Neurology

## 2019-08-21 ENCOUNTER — Other Ambulatory Visit (HOSPITAL_COMMUNITY): Payer: Self-pay | Admitting: Neurosurgery

## 2019-08-21 DIAGNOSIS — E119 Type 2 diabetes mellitus without complications: Secondary | ICD-10-CM | POA: Insufficient documentation

## 2019-08-21 DIAGNOSIS — H44119 Panuveitis, unspecified eye: Secondary | ICD-10-CM | POA: Insufficient documentation

## 2019-09-13 IMAGING — CR CHEST - 2 VIEW
2 series · 2 of 2 positions shown · non-contrast
Comparison: June 25, 2018

CLINICAL DATA: Shortness of breath. Reported history of
sarcoidosis.

EXAM:
CHEST - 2 VIEW

[w chest pa]
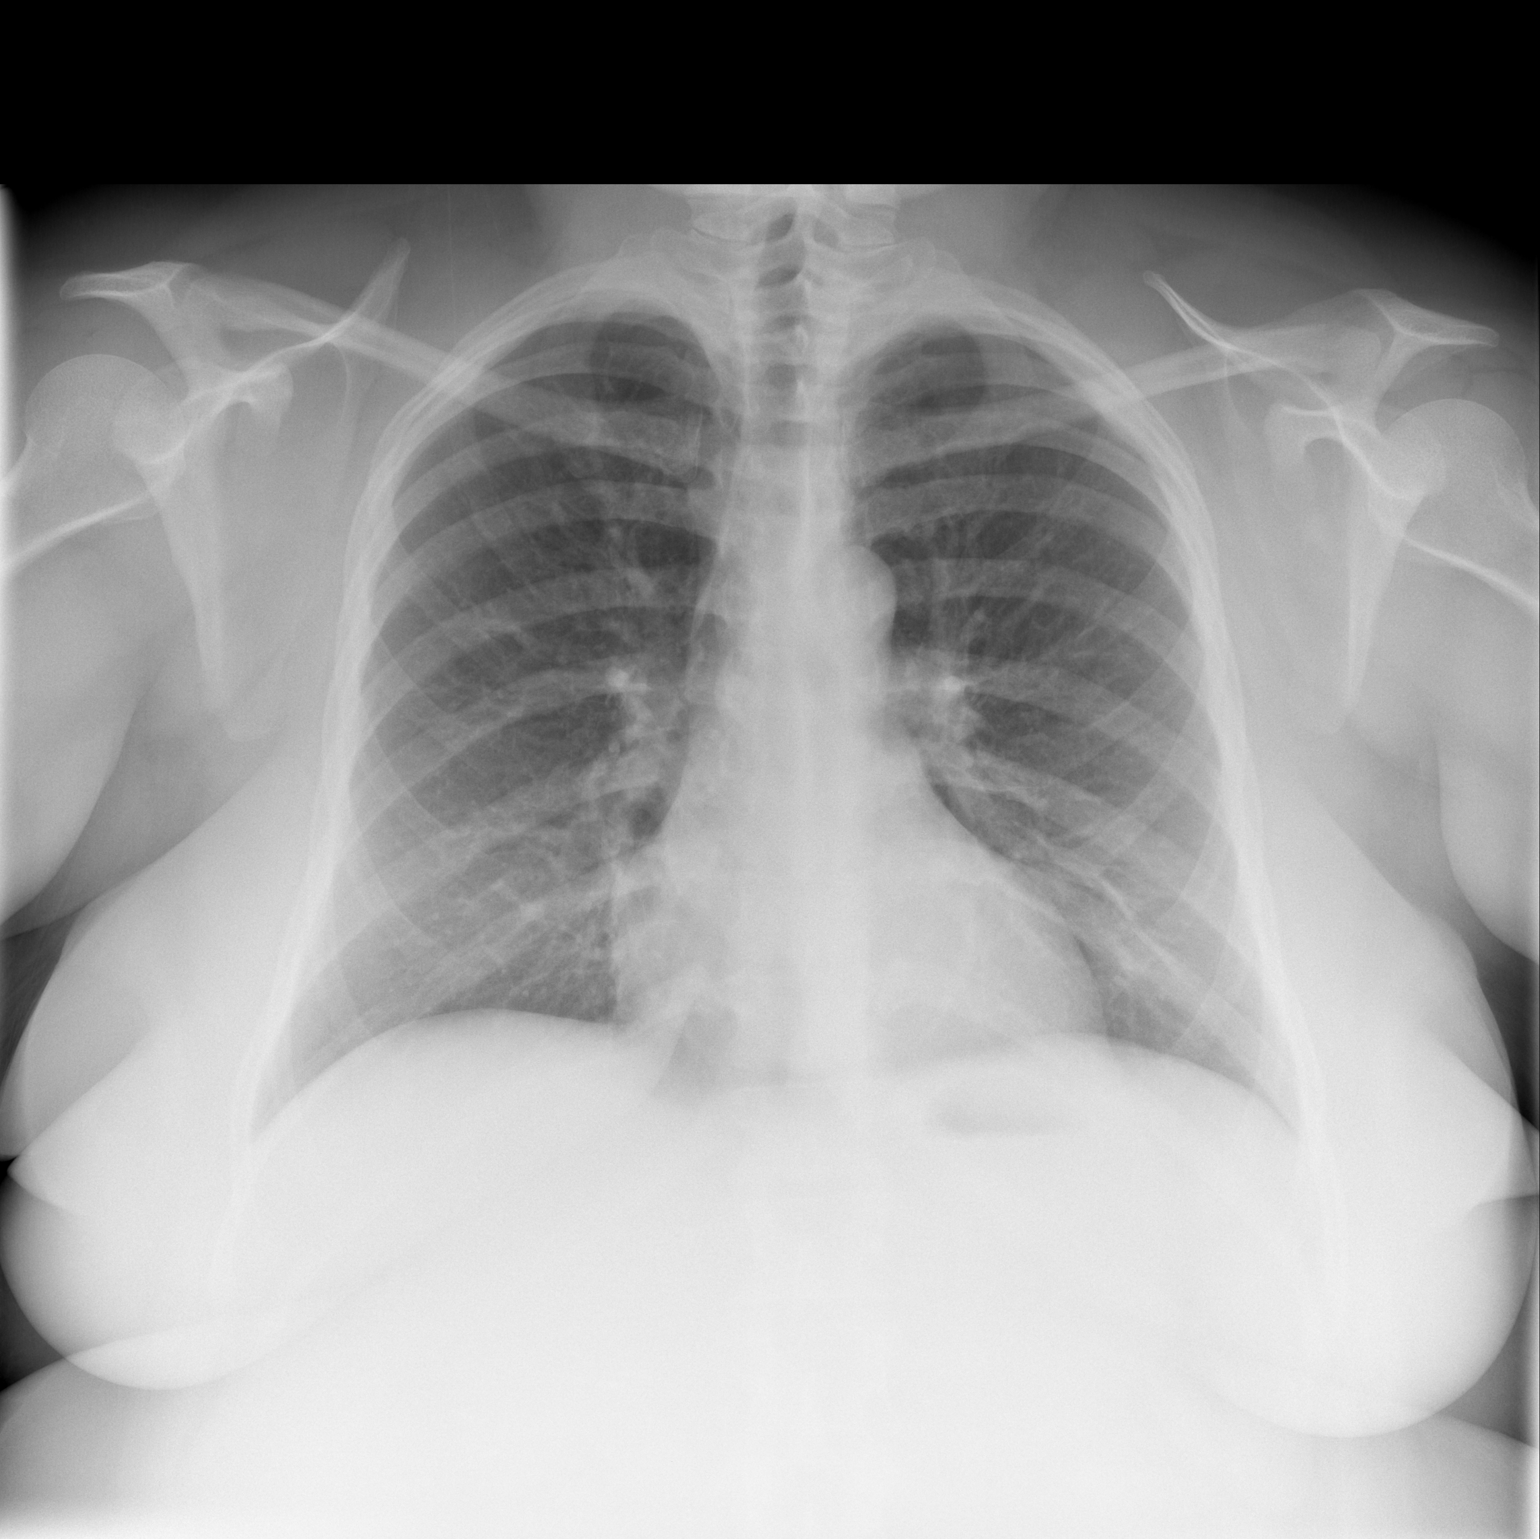

[w chest lat]
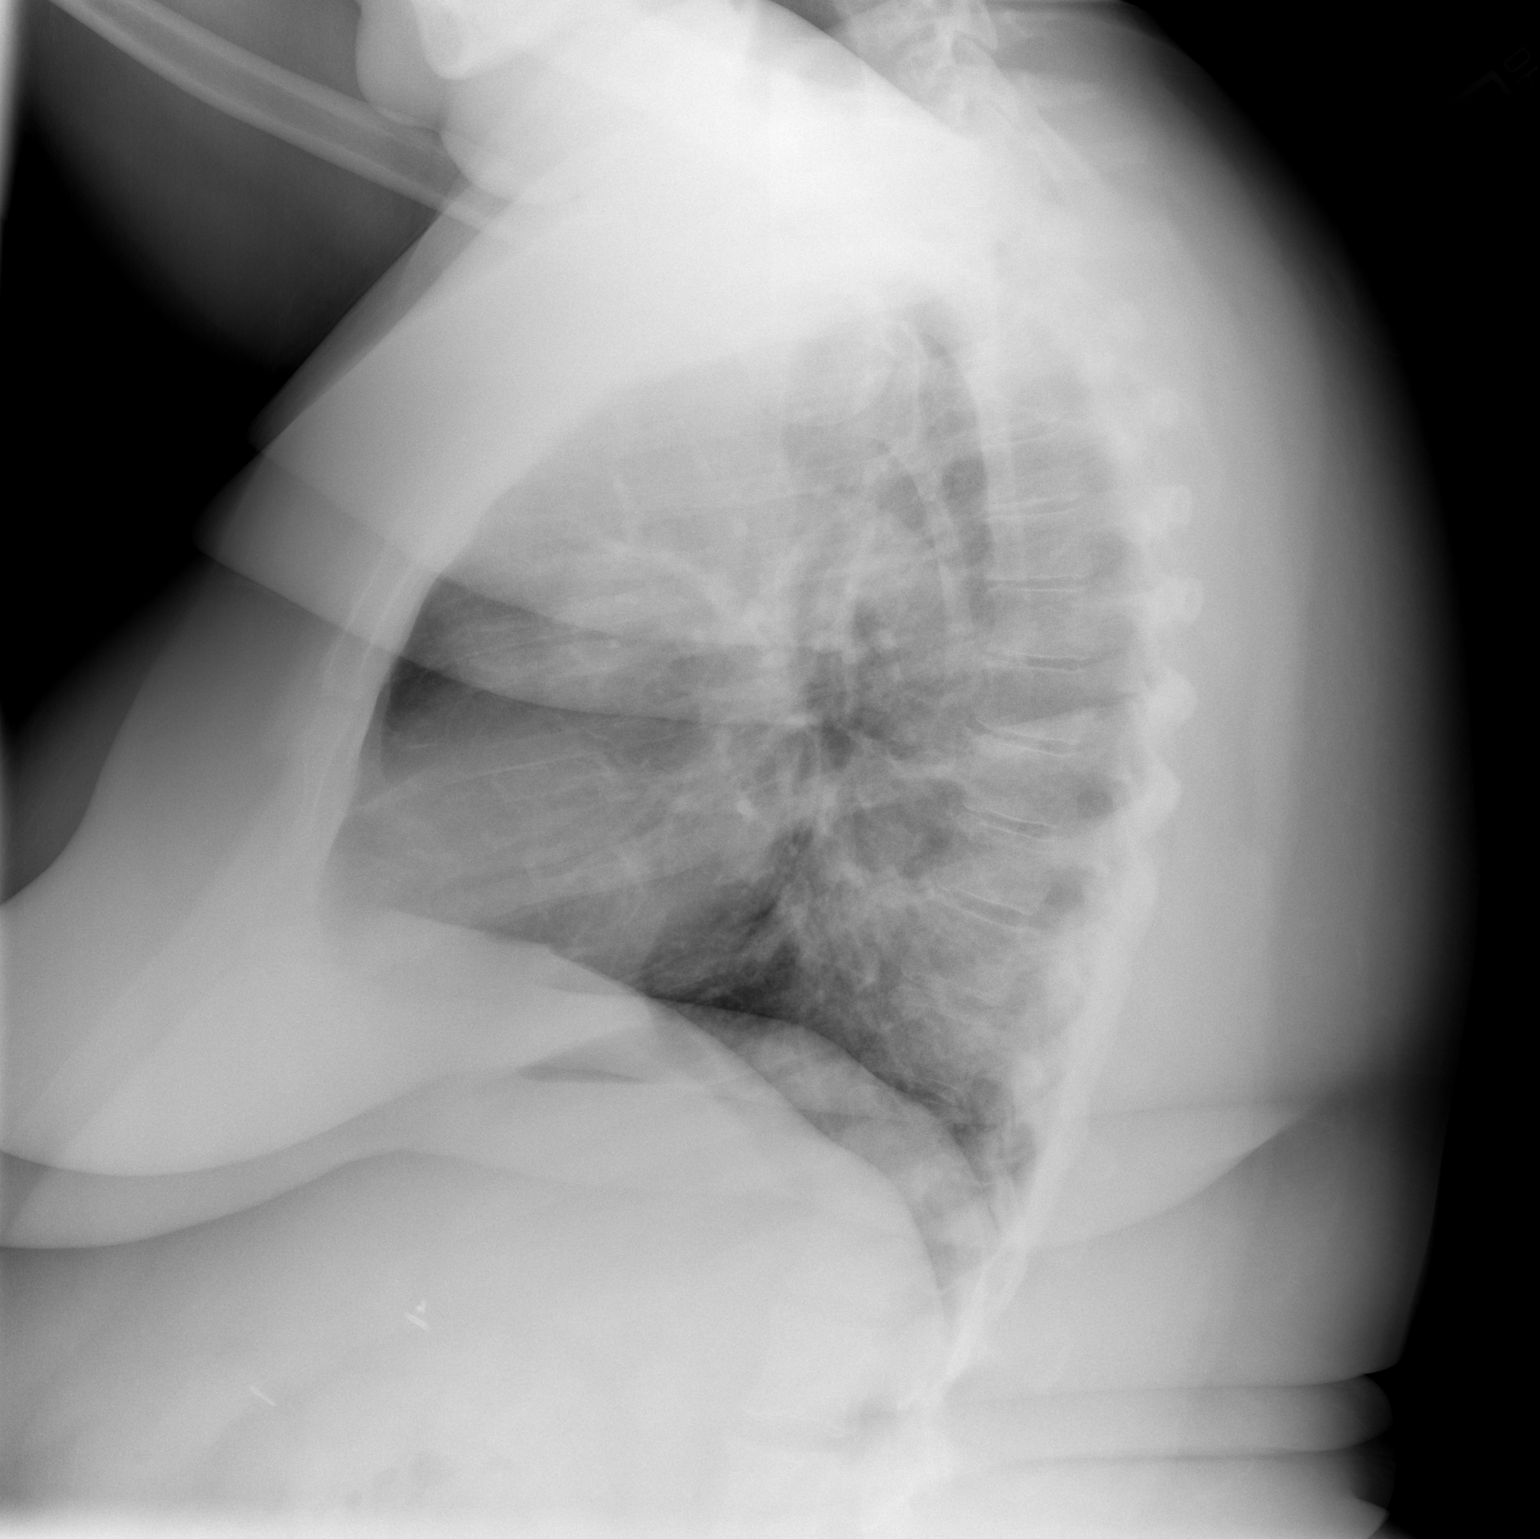

[2 of 2 positions shown; findings below may reference images not displayed]

FINDINGS: The lungs are clear. The heart size and pulmonary vascularity are
normal. No adenopathy. No bone lesions.
IMPRESSION: No edema or consolidation.  No evident adenopathy.

## 2019-11-19 DIAGNOSIS — B349 Viral infection, unspecified: Secondary | ICD-10-CM | POA: Diagnosis not present

## 2019-11-19 DIAGNOSIS — Z03818 Encounter for observation for suspected exposure to other biological agents ruled out: Secondary | ICD-10-CM | POA: Diagnosis not present

## 2019-11-26 ENCOUNTER — Ambulatory Visit
Admission: EM | Admit: 2019-11-26 | Discharge: 2019-11-26 | Disposition: A | Discharge status: Home / Self Care | Payer: 59 | Attending: Physician Assistant | Admitting: Physician Assistant

## 2019-11-26 ENCOUNTER — Other Ambulatory Visit: Payer: Self-pay

## 2019-11-26 ENCOUNTER — Telehealth: Payer: 59 | Admitting: Family

## 2019-11-26 DIAGNOSIS — R109 Unspecified abdominal pain: Secondary | ICD-10-CM

## 2019-11-26 LAB — POCT URINALYSIS DIP (MANUAL ENTRY)
Bilirubin, UA: NEGATIVE
Glucose, UA: NEGATIVE mg/dL
Ketones, POC UA: NEGATIVE mg/dL
Leukocytes, UA: NEGATIVE
Nitrite, UA: NEGATIVE
Protein Ur, POC: NEGATIVE mg/dL
Spec Grav, UA: 1.02 (ref 1.010–1.025)
Urobilinogen, UA: 0.2 E.U./dL
pH, UA: 7 (ref 5.0–8.0)

## 2019-11-26 MED ORDER — TIZANIDINE HCL 2 MG PO TABS: 2.0000 mg | ORAL_TABLET | Freq: Three times a day (TID) | ORAL | 0 refills | Status: DC | PRN

## 2019-11-26 MED ORDER — METOCLOPRAMIDE HCL 10 MG PO TABS: 10.0000 mg | ORAL_TABLET | Freq: Three times a day (TID) | ORAL | 0 refills | Status: DC | PRN

## 2019-11-26 NOTE — ED Provider Notes (Signed)
[name]    CSN: [id number] Arrival date & time: 11/26/19  1749      History   Chief Complaint Chief Complaint  Patient presents with  . Abdominal Pain    HPI Denise Cook is a 44 y.o. female.   44 year old female comes in for 3 week history of LUQ pain. Pain is dull/aching in nature, worse with movement, laying down, which can cause stabbing pain. Nausea without vomiting, associated with level of pain, Denies URI symptoms, fever. Denies diarrhea. Pain not associated with oral intake. Have noticed some early satiety.  Denies GERD-like symptoms.  For the past 3 to 4 weeks, have noticed urinary frequency and urgency without dysuria.     Past Medical History:  Diagnosis Date  . Allergy   . Asthma   . Depression   . Diabetes mellitus    diet control  . Glaucoma   . Obesity   . Pseudotumor cerebri   . Sarcoidosis 2012    Patient Active Problem List   Diagnosis Date Noted  . Bipolar 1 disorder ([place]) 04/29/2019  . 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 PVD (posterior vitreous detachment), bilateral 09/20/2018  . Borderline diabetes 09/20/2018  . Paresthesias 09/12/2018  . Lipoma of left upper extremity 05/04/2017  . 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 Sarcoid 12/18/2014  . Encounter for long-term (current) use of high-risk medication 12/18/2014  . 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 Pseudotumor cerebri 04/21/2013  . Leg pain, bilateral 04/15/2013    Past Surgical History:  Procedure Laterality Date  . ABDOMINAL HYSTERECTOMY    . CARDIAC CATHETERIZATION    . CHOLECYSTECTOMY    . EYE SURGERY Left    Removed vitreous membrane  . glaucoma shunt    . INCISION AND DRAINAGE Right 05/01/2014   Procedure: INCISION AND DRAINAGE RIGHT INDEX FLEXOR SHEATH  ;  Surgeon: Charlotte Crumb, MD;   Location: [facility];  Service: Orthopedics;  Laterality: Right;  . left eye surgery      OB History   No obstetric history on file.      Home Medications    Prior to Admission medications   Medication Sig Start Date End Date Taking? Authorizing Provider  acetaZOLAMIDE (DIAMOX) 500 MG capsule Take 1 capsule (500 mg total) by mouth 2 (two) times daily. 04/22/19  Yes Patel, Donika K, DO  ARIPiprazole (ABILIFY) 5 MG tablet Take 5 mg by mouth daily. 04/15/19  Yes [provider]  azaTHIOprine (IMURAN) 50 MG tablet Take 200 mg by mouth every evening.    Yes [provider]  budesonide-formoterol (SYMBICORT) 80-4.5 MCG/ACT inhaler Inhale into the lungs.   Yes [provider]  montelukast (SINGULAIR) 10 MG tablet Take by mouth.   Yes [provider]  TRULICITY 1.5 SP/2.3RA SOPN Inject 0.5 mLs into the skin every 7 (seven) days.  05/29/16  Yes [provider]  acetaminophen (TYLENOL) 500 MG tablet Take 1,000 mg by mouth every 6 (six) hours as needed for headache. Reported on 07/06/2015    [provider]  Adalimumab 40 MG/0.4ML PNKT Inject into the skin. 10/03/18   [provider]  albuterol (PROVENTIL HFA;VENTOLIN HFA) 108 (90 Base) MCG/ACT inhaler Inhale 2 puffs into the lungs every 6 (six) hours as needed for wheezing or shortness of breath.    [provider]  albuterol (VENTOLIN HFA) 108 (90 Base) MCG/ACT inhaler Inhale into the lungs.    [provider]  Armodafinil 250 MG tablet     [provider]  Blood Glucose Monitoring Suppl (FIFTY50 GLUCOSE METER 2.0) w/Device KIT USE TO CHECK BLOOD SUGAR ONCE DAILY. ALTERNATING MORNINGS AND EVENINGS BEFORE MEALS 08/20/17   [provider]  brimonidine-timolol (COMBIGAN) 0.2-0.5 % ophthalmic solution Apply to eye. 09/20/18   [provider]  Cholecalciferol (VITAMIN D3) 5000 units CAPS Take 5,000 Units by mouth every morning.     [provider]  dorzolamide-timolol (COSOPT) 22.3-6.8 MG/ML ophthalmic solution Place 1 drop into both eyes 2 (two) times daily.    [provider]  DULoxetine (CYMBALTA) 30 MG capsule Take 30 mg by mouth daily.  03/08/17 03/08/18  [provider]  DULoxetine (CYMBALTA) 60 MG capsule Take 60 mg by mouth daily. 04/15/19   [provider]  estradiol (VIVELLE-DOT) 0.05 MG/24HR patch Place 1 patch onto the skin 2 (two) times a week. 01/21/18   [provider]  ibuprofen (ADVIL,MOTRIN) 800 MG tablet Take 1 tablet (800 mg total) by mouth 3 (three) times daily. 03/18/18   Raylene Everts, MD  levocetirizine (XYZAL) 5 MG tablet Take 5 mg by mouth 2 (two) times daily.     [provider]  lisdexamfetamine (VYVANSE) 50 MG capsule Take 60 mg by mouth every morning.     [provider]  Magnesium 250 MG TABS Take 250 mg by mouth 2 (two) times daily.     [provider]  methocarbamol (ROBAXIN) 500 MG tablet Take 2 tablets (1,000 mg total) by mouth 4 (four) times daily. 05/05/18   Carlisle Cater, PA-C  metoCLOPramide (REGLAN) 10 MG tablet Take 1 tablet (10 mg total) by mouth every 8 (eight) hours as needed for nausea. 11/26/19   Tasia Catchings, Dayne Chait V, PA-C  ondansetron (ZOFRAN) 8 MG tablet Take 4-8 mg by mouth every 8 (eight) hours as needed. 11/13/18   [provider]  ONE TOUCH ULTRA TEST test strip USE TO CHECK BLOOD SUGAR ONCE DAILY. ALTERNATING MORNINGS AND EVENINGS BEFORE MEALS 08/20/17   [provider]  tiZANidine (ZANAFLEX) 2 MG tablet Take 1 tablet (2 mg total) by mouth every 8 (eight) hours as needed for muscle spasms. 11/26/19   Ok Edwards, PA-C    Family History Family History  Adopted: Yes  Problem Relation Age of Onset  . Healthy Daughter   . Cancer Maternal Grandmother   . Alcoholism Mother   . Diabetes Paternal Grandmother     Social History Social History   Tobacco Use  . Smoking status: Never Smoker  . Smokeless  tobacco: Never Used  Vaping Use  . Vaping Use: Never used  Substance Use Topics  . Alcohol use: No  . Drug use: No     Allergies   Other and Metformin and related   Review of Systems Review of Systems  Reason unable to perform ROS: See HPI as above.     Physical Exam Triage Vital Signs ED Triage Vitals  Enc Vitals Group     BP 11/26/19 1956 (!) 152/88     Pulse Rate 11/26/19 1956 (!) 107     Resp 11/26/19 1956 20     Temp 11/26/19 1956 98.2 F (36.8 C)     Temp Source 11/26/19 1956 Oral     SpO2 11/26/19 1956 98 %     Weight --      Height --  Head Circumference --      Peak Flow --      Pain Score 11/26/19 2006 7     Pain Loc --      Pain Edu? --      Excl. in [place]? --    No data found.  Updated Vital Signs BP (!) 152/88 (BP Location: Right Arm)   Pulse (!) 107   Temp 98.2 F (36.8 C) (Oral)   Resp 20   SpO2 98%   Physical Exam Constitutional:      General: She is not in acute distress.    Appearance: She is well-developed. She is not ill-appearing, toxic-appearing or diaphoretic.  HENT:     Head: Normocephalic and atraumatic.  Eyes:     Conjunctiva/sclera: Conjunctivae normal.     Pupils: Pupils are equal, round, and reactive to light.  Cardiovascular:     Rate and Rhythm: Normal rate and regular rhythm.  Pulmonary:     Effort: Pulmonary effort is normal. No respiratory distress.     Comments: LCTAB Abdominal:     General: Bowel sounds are normal.     Palpations: Abdomen is soft.     Comments: Patient with tenderness to palpation of lateral left trunk with tenderness to palpation of left lower chest, left upper quadrant.  No guarding or rebound.  Musculoskeletal:     Cervical back: Normal range of motion and neck supple.  Skin:    General: Skin is warm and dry.  Neurological:     Mental Status: She is alert and oriented to person, place, and time.  Psychiatric:        Behavior: Behavior normal.        Judgment: Judgment normal.       UC Treatments / Results  Labs (all labs ordered are listed, but only abnormal results are displayed) Labs Reviewed  POCT URINALYSIS DIP (MANUAL ENTRY) - Abnormal; Notable for the following components:      Result Value   Clarity, UA cloudy (*)    Blood, UA trace-intact (*)    All other components within normal limits    EKG   Radiology No results found.  Procedures Procedures (including critical care time)  Medications Ordered in UC Medications - No data to display  Initial Impression / Assessment and Plan / UC Course  I have reviewed the triage vital signs and the nursing notes.  Pertinent labs & imaging results that were available during my care of the patient were reviewed by me and considered in my medical decision making (see chart for details).    Patient afebrile, nontoxic in appearance.  Pain is diffusely to the abdomen, more to the lateral left trunk versus abdomen. ?  MSK pain versus abdominal pain.  Will trial tizanidine for symptomatic management.  Given nausea, early satiety, history of diabetes, if symptoms not improving with tizanidine, trial Reglan for possible gastroparesis.  Otherwise patient to follow-up with PCP for further evaluation if symptoms not improving.  Return precautions given.  Final Clinical Impressions(s) / UC Diagnoses   Final diagnoses:  Left sided abdominal pain   ED Prescriptions    Medication Sig Dispense Auth. Provider   tiZANidine (ZANAFLEX) 2 MG tablet Take 1 tablet (2 mg total) by mouth every 8 (eight) hours as needed for muscle spasms. 15 tablet Reiss Mowrey V, PA-C   metoCLOPramide (REGLAN) 10 MG tablet Take 1 tablet (10 mg total) by mouth every 8 (eight) hours as needed for nausea. 15 tablet Tasia Catchings, [place]  V, PA-C     PDMP not reviewed this encounter.   Ok Edwards, PA-C 11/26/19 2334

## 2019-11-26 NOTE — ED Triage Notes (Signed)
Pt c/o LUQ pain for approx 3 weeks, also reports nausea, frequency/urgency with urination for approx 3-4 weeks. Pain increases with movement.   Denies fever, chills, v/d, back pain. No improvement to pain with ibuprofen.

## 2019-11-26 NOTE — Progress Notes (Signed)
Based on what you shared with me, I feel your condition warrants further evaluation and I recommend that you be seen for a face to face office visit.  Given your current symptoms, you need to be seen face to face to rule out a more serious infection.    NOTE: If you entered your credit card information for this eVisit, you will not be charged. You may see a "hold" on your card for the $35 but that hold will drop off and you will not have a charge processed.   If you are having a true medical emergency please call 911.      For an urgent face to face visit, [facility] has five urgent care centers for your convenience:      NEW:  [facility] [place] Get Driving Directions [phone] [place], [place] [address] . 10 am - 6pm Monday - Friday    [facility] [place]) Get Driving Directions [phone] [address] [place], [place] [address] . 10 am to 8 pm Monday-Friday . 12 pm to 8 pm [facility] Get Driving Directions [phone] [place], [place], [place] [address] . 8 am to 8 pm Monday-Friday . 9 am to 6 pm Saturday . 11 am to 6 pm Sunday     [facility] Get Driving Directions  [phone] [address] [place], [place] [address] . 8 am to 8 pm Monday-Friday . 8 am to 4 pm [facility] at  Get Driving Directions [phone] [place] [place], [place] [address] . 12 pm to 6 pm Monday-Friday      Your e-visit answers were reviewed by a board certified advanced clinical practitioner to complete your personal care plan.  Thank you for using e-Visits.

## 2019-11-26 NOTE — Discharge Instructions (Signed)
No alarming signs on exam.  Urine negative for infection.  As discussed, possible muscle strain.  Start tizanidine as directed to see if this improves symptoms.  Reglan for nausea. Keep hydrated, urine should be clear to pale yellow in color.  Follow-up with PCP for further evaluation if symptoms do not improving.  If sudden worsening of symptoms, significant abdominal pain, shortness of breath, chest pain, go to the emergency for further evaluation

## 2020-02-16 DIAGNOSIS — G4712 Idiopathic hypersomnia without long sleep time: Secondary | ICD-10-CM | POA: Diagnosis not present

## 2020-02-16 DIAGNOSIS — G4733 Obstructive sleep apnea (adult) (pediatric): Secondary | ICD-10-CM | POA: Diagnosis not present

## 2020-03-08 ENCOUNTER — Other Ambulatory Visit (HOSPITAL_COMMUNITY): Payer: Self-pay | Admitting: Family Medicine

## 2020-03-08 DIAGNOSIS — J45991 Cough variant asthma: Secondary | ICD-10-CM | POA: Diagnosis not present

## 2020-03-08 DIAGNOSIS — M25562 Pain in left knee: Secondary | ICD-10-CM | POA: Diagnosis not present

## 2020-03-08 DIAGNOSIS — R32 Unspecified urinary incontinence: Secondary | ICD-10-CM | POA: Diagnosis not present

## 2020-03-08 DIAGNOSIS — E1165 Type 2 diabetes mellitus with hyperglycemia: Secondary | ICD-10-CM | POA: Diagnosis not present

## 2020-03-08 DIAGNOSIS — Z0000 Encounter for general adult medical examination without abnormal findings: Secondary | ICD-10-CM | POA: Diagnosis not present

## 2020-03-08 DIAGNOSIS — J309 Allergic rhinitis, unspecified: Secondary | ICD-10-CM | POA: Diagnosis not present

## 2020-03-08 DIAGNOSIS — G932 Benign intracranial hypertension: Secondary | ICD-10-CM | POA: Diagnosis not present

## 2020-03-08 DIAGNOSIS — G4733 Obstructive sleep apnea (adult) (pediatric): Secondary | ICD-10-CM | POA: Diagnosis not present

## 2020-03-08 DIAGNOSIS — Z13220 Encounter for screening for lipoid disorders: Secondary | ICD-10-CM | POA: Diagnosis not present

## 2020-03-17 DIAGNOSIS — Z79899 Other long term (current) drug therapy: Secondary | ICD-10-CM | POA: Diagnosis not present

## 2020-03-17 DIAGNOSIS — D869 Sarcoidosis, unspecified: Secondary | ICD-10-CM | POA: Diagnosis not present

## 2020-03-17 DIAGNOSIS — H44113 Panuveitis, bilateral: Secondary | ICD-10-CM | POA: Diagnosis not present

## 2020-03-17 DIAGNOSIS — H40041 Steroid responder, right eye: Secondary | ICD-10-CM | POA: Diagnosis not present

## 2020-04-06 ENCOUNTER — Telehealth: Payer: Self-pay | Admitting: Pharmacist

## 2020-04-06 ENCOUNTER — Other Ambulatory Visit (HOSPITAL_COMMUNITY): Payer: Self-pay | Admitting: Family Medicine

## 2020-04-06 MED FILL — LEVOCETIRIZINE 5 MG TABLET: 5 | 90 days supply | Qty: 90 | Fill #0

## 2020-04-06 MED FILL — ARIPiprazole 5 MG TABS: 5 | 90 days supply | Qty: 90 | Fill #0

## 2020-04-06 MED FILL — ACETAZOLAMIDE ER 500 MG CAP: 500 | 90 days supply | Qty: 180 | Fill #0

## 2020-04-06 MED FILL — ATORVASTATIN CALCIUM 10 MG: 10 | 90 days supply | Qty: 13 | Fill #0

## 2020-04-06 NOTE — Telephone Encounter (Signed)
Called patient to schedule an appointment for the [employer] [facility]. I was unable to reach the patient so I left a HIPAA-compliant message requesting that the patient return my call.   Luke Van Ausdall, PharmD, BCACP, CPP Clinical Pharmacist [facility] [phone]  

## 2020-04-07 ENCOUNTER — Other Ambulatory Visit (HOSPITAL_COMMUNITY): Payer: Self-pay | Admitting: Internal Medicine

## 2020-04-07 MED FILL — ARMODAFINIL 250 MG TABLET: 250 | 90 days supply | Qty: 90 | Fill #0

## 2020-04-09 ENCOUNTER — Other Ambulatory Visit (HOSPITAL_COMMUNITY): Payer: Self-pay | Admitting: Rheumatology

## 2020-04-09 MED FILL — azaTHIOprine 50 MG TABS: 50 | 90 days supply | Qty: 360 | Fill #0

## 2020-05-19 ENCOUNTER — Other Ambulatory Visit: Payer: Self-pay | Admitting: Internal Medicine

## 2020-05-19 ENCOUNTER — Ambulatory Visit (HOSPITAL_BASED_OUTPATIENT_CLINIC_OR_DEPARTMENT_OTHER): Payer: 59 | Admitting: Pharmacist

## 2020-05-19 ENCOUNTER — Other Ambulatory Visit: Payer: Self-pay

## 2020-05-19 DIAGNOSIS — Z79899 Other long term (current) drug therapy: Secondary | ICD-10-CM

## 2020-05-19 MED ORDER — HUMIRA (2 PEN) 40 MG/0.4ML ~~LOC~~ AJKT: AUTO-INJECTOR | SUBCUTANEOUS | 3 refills | Status: DC

## 2020-05-19 NOTE — Progress Notes (Signed)
  S: Patient presents for review of their specialty medication therapy.  Patient is currently prescribed Humira for uveitis. Patient is managed by Dr. Marlana Salvage for this.   Adherence: has been on the medication previously but has not been taking for some time due to insurance issues.   Efficacy: reports that Humira has worked well for her in the past. She is familiar with the medication.   Dosing:  Uveitis (Humira only): SubQ: Initial: 80 mg as a single dose Maintenance: 40 mg every other week beginning 1 week after initial dose  Dose adjustments: Renal: no dose adjustments (has not been studied) Hepatic: no dose adjustments (has not been studied)  Drug-drug interactions: none   Screening: TB test: completed per pt; negative  Hepatitis: completed per pt; negative   Monitoring: S/sx of infection: none  CBC: WNL per Care Everywhere  S/sx of hypersensitivity: none when on Humira previously  S/sx of malignancy: none when on Humira previously  S/sx of heart failure: none when on Humira previously   Other side effects: none when on Humira previously   O:     Lab Results  Component Value Date   WBC 9.6 02/11/2018   HGB 12.4 02/11/2018   HCT 39.4 02/11/2018   MCV 94.9 02/11/2018   PLT 424 (H) 02/11/2018      Chemistry      Component Value Date/Time   NA 140 02/11/2018 2052   K 3.5 02/11/2018 2052   CL 107 02/11/2018 2052   CO2 25 02/11/2018 2052   BUN 10 02/11/2018 2052   CREATININE 1.09 (H) 02/11/2018 2052   CREATININE 1.03 07/16/2017 1126      Component Value Date/Time   CALCIUM 9.1 02/11/2018 2052   ALKPHOS 62 02/11/2018 2052   AST 21 02/11/2018 2052   ALT 17 02/11/2018 2052   BILITOT 0.7 02/11/2018 2052       A/P: 1. Medication review: Patient currently prescribed Humira for uveitis. Reviewed the medication with the patient, including the following: Humira is a TNF blocking agent indicated for ankylosing spondylitis, Crohn's disease, Hidradenitis  suppurativa, psoriatic arthritis, plaque psoriasis, ulcerative colitis, and uveitis. Patient educated on purpose, proper use and potential adverse effects of Humira. Possible adverse effects are increased risk of infections, headache, and injection site reactions. There is the possibility of an increased risk of malignancy but it is not well understood if this increased risk is due to there medication or the disease state. There are rare cases of pancytopenia and aplastic anemia. For SubQ injection at separate sites in the thigh or lower abdomen (avoiding areas within 2 inches of navel); rotate injection sites. May leave at room temperature for ~15 to 30 minutes prior to use; do not remove cap or cover while allowing product to reach room temperature. Do not use if solution is discolored or contains particulate matter. Do not administer to skin which is red, tender, bruised, hard, or that has scars, stretch marks, or psoriasis plaques. Needle cap of the prefilled syringe or needle cover for the adalimumab pen may contain latex. Prefilled pens and syringes are available for use by patients and the full amount of the syringe should be injected (self-administration); the vial is intended for institutional use only. Vials do not contain a preservative; discard unused portion. No recommendations for any changes at this time.  Benard Halsted, PharmD, [place], [place] [phone]

## 2020-06-08 DIAGNOSIS — J45901 Unspecified asthma with (acute) exacerbation: Secondary | ICD-10-CM | POA: Diagnosis not present

## 2020-06-21 DIAGNOSIS — G4733 Obstructive sleep apnea (adult) (pediatric): Secondary | ICD-10-CM | POA: Diagnosis not present

## 2020-06-21 DIAGNOSIS — G4712 Idiopathic hypersomnia without long sleep time: Secondary | ICD-10-CM | POA: Diagnosis not present

## 2020-07-07 ENCOUNTER — Other Ambulatory Visit (HOSPITAL_COMMUNITY): Payer: Self-pay

## 2020-07-08 ENCOUNTER — Other Ambulatory Visit (HOSPITAL_COMMUNITY): Payer: Self-pay

## 2020-07-08 MED FILL — Armodafinil Tab 250 MG: ORAL | 90 days supply | Qty: 90 | Fill #0 | Status: AC

## 2020-07-08 MED FILL — Levocetirizine Dihydrochloride Tab 5 MG: ORAL | 90 days supply | Qty: 90 | Fill #0 | Status: AC

## 2020-07-08 MED FILL — Atorvastatin Calcium Tab 10 MG (Base Equivalent): ORAL | 13 days supply | Qty: 13 | Fill #0 | Status: CN

## 2020-07-09 ENCOUNTER — Other Ambulatory Visit (HOSPITAL_COMMUNITY): Payer: Self-pay

## 2020-07-09 MED ORDER — ATORVASTATIN CALCIUM 10 MG PO TABS
10.0000 mg | ORAL_TABLET | ORAL | 3 refills | Status: DC
  Filled 2020-07-09: qty 12, 84d supply, fill #0
  Filled 2020-10-09: qty 12, 84d supply, fill #1
  Filled 2021-01-05: qty 12, 84d supply, fill #2
  Filled 2021-06-16: qty 12, 84d supply, fill #3

## 2020-07-12 ENCOUNTER — Other Ambulatory Visit (HOSPITAL_COMMUNITY): Payer: Self-pay

## 2020-07-12 MED ORDER — ARIPIPRAZOLE 5 MG PO TABS
5.0000 mg | ORAL_TABLET | Freq: Every day | ORAL | 0 refills | Status: DC
  Filled 2020-07-12: qty 90, 90d supply, fill #0

## 2020-07-13 ENCOUNTER — Other Ambulatory Visit (HOSPITAL_COMMUNITY): Payer: Self-pay

## 2020-07-13 MED FILL — Adalimumab Auto-injector Kit 40 MG/0.4ML: SUBCUTANEOUS | 84 days supply | Qty: 12 | Fill #0 | Status: CN

## 2020-07-15 ENCOUNTER — Other Ambulatory Visit (HOSPITAL_COMMUNITY): Payer: Self-pay

## 2020-07-16 ENCOUNTER — Other Ambulatory Visit (HOSPITAL_COMMUNITY): Payer: Self-pay

## 2020-07-30 ENCOUNTER — Other Ambulatory Visit (HOSPITAL_COMMUNITY): Payer: Self-pay

## 2020-08-03 ENCOUNTER — Other Ambulatory Visit (HOSPITAL_COMMUNITY): Payer: Self-pay

## 2020-08-10 ENCOUNTER — Other Ambulatory Visit (HOSPITAL_COMMUNITY): Payer: Self-pay

## 2020-08-10 MED ORDER — ONDANSETRON HCL 8 MG PO TABS
ORAL_TABLET | Freq: Three times a day (TID) | ORAL | 0 refills | Status: DC
  Filled 2020-08-10: qty 20, 7d supply, fill #0

## 2020-08-10 MED ORDER — DULOXETINE HCL 60 MG PO CPEP
60.0000 mg | ORAL_CAPSULE | Freq: Every day | ORAL | 0 refills | Status: DC
  Filled 2020-08-10: qty 90, 90d supply, fill #0

## 2020-08-10 MED FILL — Acetazolamide Cap ER 12HR 500 MG: ORAL | 45 days supply | Qty: 90 | Fill #0 | Status: AC

## 2020-08-11 ENCOUNTER — Other Ambulatory Visit (HOSPITAL_COMMUNITY): Payer: Self-pay

## 2020-08-12 ENCOUNTER — Other Ambulatory Visit (HOSPITAL_COMMUNITY): Payer: Self-pay

## 2020-08-13 ENCOUNTER — Other Ambulatory Visit (HOSPITAL_COMMUNITY): Payer: Self-pay

## 2020-08-13 DIAGNOSIS — Z79899 Other long term (current) drug therapy: Secondary | ICD-10-CM | POA: Diagnosis not present

## 2020-08-13 DIAGNOSIS — H44113 Panuveitis, bilateral: Secondary | ICD-10-CM | POA: Diagnosis not present

## 2020-08-13 DIAGNOSIS — D869 Sarcoidosis, unspecified: Secondary | ICD-10-CM | POA: Diagnosis not present

## 2020-08-13 DIAGNOSIS — H40041 Steroid responder, right eye: Secondary | ICD-10-CM | POA: Diagnosis not present

## 2020-08-14 ENCOUNTER — Other Ambulatory Visit (HOSPITAL_COMMUNITY): Payer: Self-pay

## 2020-08-17 ENCOUNTER — Other Ambulatory Visit (HOSPITAL_COMMUNITY): Payer: Self-pay

## 2020-08-19 ENCOUNTER — Other Ambulatory Visit (HOSPITAL_COMMUNITY): Payer: Self-pay

## 2020-08-26 ENCOUNTER — Other Ambulatory Visit (HOSPITAL_COMMUNITY): Payer: Self-pay

## 2020-08-27 ENCOUNTER — Other Ambulatory Visit: Payer: Self-pay | Admitting: Pharmacist

## 2020-08-27 ENCOUNTER — Other Ambulatory Visit (HOSPITAL_COMMUNITY): Payer: Self-pay

## 2020-08-27 MED ORDER — HUMIRA (2 PEN) 40 MG/0.4ML ~~LOC~~ AJKT
AUTO-INJECTOR | SUBCUTANEOUS | 3 refills | Status: DC
  Filled 2020-08-27: qty 6, fill #0
  Filled 2020-09-01: qty 2, 28d supply, fill #0

## 2020-08-27 MED ORDER — HUMIRA (2 PEN) 40 MG/0.4ML ~~LOC~~ AJKT: AUTO-INJECTOR | SUBCUTANEOUS | 3 refills | Status: DC

## 2020-09-01 ENCOUNTER — Other Ambulatory Visit (HOSPITAL_COMMUNITY): Payer: Self-pay

## 2020-09-02 ENCOUNTER — Other Ambulatory Visit (HOSPITAL_COMMUNITY): Payer: Self-pay

## 2020-09-02 ENCOUNTER — Other Ambulatory Visit: Payer: Self-pay | Admitting: Pharmacist

## 2020-09-02 MED ORDER — HUMIRA 40 MG/0.8ML ~~LOC~~ PSKT: PREFILLED_SYRINGE | SUBCUTANEOUS | 3 refills | Status: DC

## 2020-09-02 MED ORDER — HUMIRA 40 MG/0.8ML ~~LOC~~ PSKT
PREFILLED_SYRINGE | SUBCUTANEOUS | 3 refills | Status: DC
  Filled 2020-09-02: qty 9.6, fill #0
  Filled 2020-09-03 – 2020-09-09 (×2): qty 1.6, 28d supply, fill #0

## 2020-09-03 ENCOUNTER — Other Ambulatory Visit (HOSPITAL_COMMUNITY): Payer: Self-pay

## 2020-09-06 ENCOUNTER — Other Ambulatory Visit (HOSPITAL_COMMUNITY): Payer: Self-pay

## 2020-09-06 DIAGNOSIS — F3181 Bipolar II disorder: Secondary | ICD-10-CM | POA: Diagnosis not present

## 2020-09-06 DIAGNOSIS — G4733 Obstructive sleep apnea (adult) (pediatric): Secondary | ICD-10-CM | POA: Diagnosis not present

## 2020-09-06 DIAGNOSIS — M25562 Pain in left knee: Secondary | ICD-10-CM | POA: Diagnosis not present

## 2020-09-06 DIAGNOSIS — J309 Allergic rhinitis, unspecified: Secondary | ICD-10-CM | POA: Diagnosis not present

## 2020-09-06 DIAGNOSIS — R110 Nausea: Secondary | ICD-10-CM | POA: Diagnosis not present

## 2020-09-06 DIAGNOSIS — G932 Benign intracranial hypertension: Secondary | ICD-10-CM | POA: Diagnosis not present

## 2020-09-06 DIAGNOSIS — J45991 Cough variant asthma: Secondary | ICD-10-CM | POA: Diagnosis not present

## 2020-09-06 DIAGNOSIS — F5081 Binge eating disorder: Secondary | ICD-10-CM | POA: Diagnosis not present

## 2020-09-06 DIAGNOSIS — E1165 Type 2 diabetes mellitus with hyperglycemia: Secondary | ICD-10-CM | POA: Diagnosis not present

## 2020-09-06 MED ORDER — OXYBUTYNIN CHLORIDE 5 MG PO TABS
5.0000 mg | ORAL_TABLET | Freq: Three times a day (TID) | ORAL | 1 refills | Status: DC | PRN
  Filled 2020-09-06: qty 270, 90d supply, fill #0

## 2020-09-06 MED ORDER — OZEMPIC (0.25 OR 0.5 MG/DOSE) 2 MG/1.5ML ~~LOC~~ SOPN
PEN_INJECTOR | SUBCUTANEOUS | 1 refills | Status: DC
  Filled 2020-09-06: qty 4.5, 84d supply, fill #0
  Filled 2020-12-09: qty 4.5, 84d supply, fill #1

## 2020-09-06 MED ORDER — ARIPIPRAZOLE 5 MG PO TABS
5.0000 mg | ORAL_TABLET | Freq: Every day | ORAL | 1 refills | Status: DC
  Filled 2020-09-06 – 2020-12-09 (×2): qty 90, 90d supply, fill #0

## 2020-09-06 MED ORDER — VYVANSE 40 MG PO CAPS
40.0000 mg | ORAL_CAPSULE | Freq: Every morning | ORAL | 0 refills | Status: DC
  Filled 2020-09-06 – 2020-10-09 (×2): qty 30, 30d supply, fill #0

## 2020-09-06 MED ORDER — ACETAZOLAMIDE ER 500 MG PO CP12
500.0000 mg | ORAL_CAPSULE | Freq: Two times a day (BID) | ORAL | 1 refills | Status: DC
  Filled 2020-09-06 – 2020-10-09 (×2): qty 180, 90d supply, fill #0
  Filled 2021-02-18 – 2021-03-11 (×2): qty 180, 90d supply, fill #1

## 2020-09-06 MED ORDER — DULOXETINE HCL 60 MG PO CPEP
60.0000 mg | ORAL_CAPSULE | Freq: Every day | ORAL | 1 refills | Status: DC
  Filled 2020-09-06 – 2020-12-09 (×2): qty 90, 90d supply, fill #0

## 2020-09-06 MED ORDER — VYVANSE 40 MG PO CAPS
40.0000 mg | ORAL_CAPSULE | Freq: Every morning | ORAL | 0 refills | Status: DC
  Filled 2020-09-06: qty 30, 30d supply, fill #0

## 2020-09-06 MED ORDER — VYVANSE 40 MG PO CAPS
40.0000 mg | ORAL_CAPSULE | Freq: Every morning | ORAL | 0 refills | Status: DC
  Filled 2020-09-06 – 2020-11-12 (×2): qty 30, 30d supply, fill #0

## 2020-09-07 ENCOUNTER — Other Ambulatory Visit (HOSPITAL_COMMUNITY): Payer: Self-pay

## 2020-09-09 ENCOUNTER — Other Ambulatory Visit (HOSPITAL_COMMUNITY): Payer: Self-pay

## 2020-09-10 ENCOUNTER — Other Ambulatory Visit (HOSPITAL_BASED_OUTPATIENT_CLINIC_OR_DEPARTMENT_OTHER): Payer: Self-pay

## 2020-09-10 ENCOUNTER — Other Ambulatory Visit (HOSPITAL_COMMUNITY): Payer: Self-pay

## 2020-09-10 MED ORDER — AZATHIOPRINE 50 MG PO TABS
ORAL_TABLET | ORAL | 3 refills | Status: DC
  Filled 2020-09-10 – 2020-09-17 (×2): qty 360, 90d supply, fill #0
  Filled 2021-01-05: qty 360, 90d supply, fill #1

## 2020-09-13 ENCOUNTER — Other Ambulatory Visit (HOSPITAL_COMMUNITY): Payer: Self-pay

## 2020-09-13 ENCOUNTER — Other Ambulatory Visit (HOSPITAL_BASED_OUTPATIENT_CLINIC_OR_DEPARTMENT_OTHER): Payer: Self-pay

## 2020-09-17 ENCOUNTER — Other Ambulatory Visit (HOSPITAL_BASED_OUTPATIENT_CLINIC_OR_DEPARTMENT_OTHER): Payer: Self-pay

## 2020-09-17 ENCOUNTER — Other Ambulatory Visit (HOSPITAL_COMMUNITY): Payer: Self-pay

## 2020-09-20 ENCOUNTER — Other Ambulatory Visit (HOSPITAL_COMMUNITY): Payer: Self-pay

## 2020-09-24 ENCOUNTER — Other Ambulatory Visit (HOSPITAL_COMMUNITY): Payer: Self-pay

## 2020-09-26 ENCOUNTER — Other Ambulatory Visit (HOSPITAL_BASED_OUTPATIENT_CLINIC_OR_DEPARTMENT_OTHER): Payer: Self-pay | Admitting: Internal Medicine

## 2020-09-26 ENCOUNTER — Telehealth: Payer: 59 | Admitting: Emergency Medicine

## 2020-09-26 DIAGNOSIS — R110 Nausea: Secondary | ICD-10-CM | POA: Diagnosis not present

## 2020-09-26 DIAGNOSIS — R197 Diarrhea, unspecified: Secondary | ICD-10-CM

## 2020-09-26 DIAGNOSIS — K30 Functional dyspepsia: Secondary | ICD-10-CM | POA: Diagnosis not present

## 2020-09-26 NOTE — Progress Notes (Signed)
Based on what you shared with me, including burping that smells like eggs associated with diarrhea and nausea not controlled with zofran, I feel your condition warrants further evaluation and I recommend that you be seen in a face to face visit for a thorough abdominal exam and possible lab work.    NOTE: There will be NO CHARGE for this eVisit   If you are having a true medical emergency please call 911.      For an urgent face to face visit, [facility] has six urgent care centers for your convenience:     [facility] [place] [facility] [place] Get Driving Directions [phone] [place], [place] [address]    [place] [facility]) Get Driving Directions [phone] [place], [place] [address]  [facility] [place] ([place]) Get Driving Directions [phone] [address] [place],  [place]  [address]  [facility] Get Driving Directions [phone] [place], [place], [place] [address]   [facility] Get Driving Directions  [phone] [address] [place], [place] [address]   [facility] at [place] Get Driving Directions [phone] [address] [place],  [address]  Your MyChart E-visit questionnaire answers were reviewed by a board certified advanced clinical practitioner to complete your personal care plan based on your specific symptoms.  Thank you for using e-Visits.   Approximately 5 minutes was spent documenting and reviewing patient's chart.

## 2020-09-26 NOTE — Progress Notes (Signed)
You can take over the counter imodium 2mg  tablets as noted below but in-person evaluation is still recommended and calling an ambulance if you develop lightheadedness/passing out from fluid loss.   Acute infectious diarrhea.   We are sorry that you are not feeling well.  Here is how we plan to help!   Based on what you have shared with me it looks like you have Acute Infectious Diarrhea.   Most cases of acute diarrhea are due to infections with virus and bacteria and are self-limited conditions lasting less than 14 days.   For your symptoms you may take Imodium 2 mg tablets that are over the counter at your local pharmacy. Take two tablet now and then one after each loose stool up to 6 a day. Antibiotics are not needed for most people with diarrhea.   HOME CARE We recommend changing your diet to help with your symptoms for the next few days. Drink plenty of fluids that contain water salt and sugar. Sports drinks such as Gatorade may help. You may try broths, soups, bananas, applesauce, soft breads, mashed potatoes or crackers. You are considered infectious for as long as the diarrhea continues. Hand washing or use of alcohol based hand sanitizers is recommend. It is best to stay out of work or school until your symptoms stop.   GET HELP RIGHT AWAY If you have dark yellow colored urine or do not pass urine frequently you should drink more fluids.   If your symptoms worsen If you feel like you are going to pass out (faint) You have a new problem   MAKE SURE YOU  Understand these instructions. Will watch your condition. Will get help right away if you are not doing well or get worse.   Thank you for choosing an e-visit.   Your e-visit answers were reviewed by a board certified advanced clinical practitioner to complete your personal care plan. Depending upon the condition, your plan could have included both over the counter or prescription medications.   Please review your pharmacy  choice. Make sure the pharmacy is open so you can pick up prescription now. If there is a problem, you may contact your provider through [employer] and have the prescription routed to another pharmacy.  Your safety is important to [place]. If you have drug allergies check your prescription carefully.   For the next 24 hours you can use MyChart to ask questions about today's visit, request a non-urgent call back, or ask for a work or school excuse. You will get an email in the next two days asking about your experience. I hope that your e-visit has been valuable and will speed your recovery.

## 2020-09-27 ENCOUNTER — Other Ambulatory Visit (HOSPITAL_COMMUNITY): Payer: Self-pay

## 2020-09-30 ENCOUNTER — Other Ambulatory Visit (HOSPITAL_COMMUNITY): Payer: Self-pay

## 2020-09-30 MED ORDER — ARMODAFINIL 250 MG PO TABS
250.0000 mg | ORAL_TABLET | Freq: Every day | ORAL | 1 refills | Status: DC
  Filled 2020-09-30 – 2020-10-09 (×2): qty 90, 90d supply, fill #0
  Filled 2021-01-05 (×2): qty 90, 90d supply, fill #1

## 2020-10-09 ENCOUNTER — Other Ambulatory Visit (HOSPITAL_COMMUNITY): Payer: Self-pay

## 2020-10-09 MED FILL — Levocetirizine Dihydrochloride Tab 5 MG: ORAL | 90 days supply | Qty: 90 | Fill #1 | Status: AC

## 2020-10-11 ENCOUNTER — Other Ambulatory Visit (HOSPITAL_COMMUNITY): Payer: Self-pay

## 2020-11-12 ENCOUNTER — Other Ambulatory Visit (HOSPITAL_COMMUNITY): Payer: Self-pay

## 2020-11-26 DIAGNOSIS — R7303 Prediabetes: Secondary | ICD-10-CM | POA: Diagnosis not present

## 2020-11-26 DIAGNOSIS — H40041 Steroid responder, right eye: Secondary | ICD-10-CM | POA: Diagnosis not present

## 2020-11-26 DIAGNOSIS — D869 Sarcoidosis, unspecified: Secondary | ICD-10-CM | POA: Diagnosis not present

## 2020-11-26 DIAGNOSIS — Z79899 Other long term (current) drug therapy: Secondary | ICD-10-CM | POA: Diagnosis not present

## 2020-11-26 DIAGNOSIS — G932 Benign intracranial hypertension: Secondary | ICD-10-CM | POA: Diagnosis not present

## 2020-11-26 DIAGNOSIS — H44113 Panuveitis, bilateral: Secondary | ICD-10-CM | POA: Diagnosis not present

## 2020-11-26 DIAGNOSIS — H43813 Vitreous degeneration, bilateral: Secondary | ICD-10-CM | POA: Diagnosis not present

## 2020-12-02 DIAGNOSIS — Z803 Family history of malignant neoplasm of breast: Secondary | ICD-10-CM | POA: Diagnosis not present

## 2020-12-02 DIAGNOSIS — Z800 Family history of malignant neoplasm of digestive organs: Secondary | ICD-10-CM | POA: Diagnosis not present

## 2020-12-02 DIAGNOSIS — Z6841 Body Mass Index (BMI) 40.0 and over, adult: Secondary | ICD-10-CM | POA: Diagnosis not present

## 2020-12-02 DIAGNOSIS — Z01419 Encounter for gynecological examination (general) (routine) without abnormal findings: Secondary | ICD-10-CM | POA: Diagnosis not present

## 2020-12-02 DIAGNOSIS — Z1231 Encounter for screening mammogram for malignant neoplasm of breast: Secondary | ICD-10-CM | POA: Diagnosis not present

## 2020-12-07 ENCOUNTER — Other Ambulatory Visit (HOSPITAL_COMMUNITY): Payer: Self-pay

## 2020-12-07 MED ORDER — VYVANSE 40 MG PO CAPS: 40.0000 mg | ORAL_CAPSULE | Freq: Every morning | ORAL | 0 refills | Status: DC

## 2020-12-07 MED ORDER — VYVANSE 40 MG PO CAPS
40.0000 mg | ORAL_CAPSULE | Freq: Every morning | ORAL | 0 refills | Status: DC
  Filled 2021-01-12: qty 30, 30d supply, fill #0

## 2020-12-07 MED ORDER — VYVANSE 40 MG PO CAPS
40.0000 mg | ORAL_CAPSULE | Freq: Every morning | ORAL | 0 refills | Status: DC
  Filled 2020-12-09 – 2020-12-13 (×2): qty 30, 30d supply, fill #0

## 2020-12-09 ENCOUNTER — Other Ambulatory Visit (HOSPITAL_COMMUNITY): Payer: Self-pay

## 2020-12-10 ENCOUNTER — Other Ambulatory Visit (HOSPITAL_COMMUNITY): Payer: Self-pay

## 2020-12-13 ENCOUNTER — Other Ambulatory Visit (HOSPITAL_COMMUNITY): Payer: Self-pay

## 2020-12-23 ENCOUNTER — Other Ambulatory Visit (HOSPITAL_COMMUNITY): Payer: Self-pay

## 2020-12-23 ENCOUNTER — Telehealth: Payer: 59 | Admitting: Physician Assistant

## 2020-12-23 DIAGNOSIS — J020 Streptococcal pharyngitis: Secondary | ICD-10-CM | POA: Diagnosis not present

## 2020-12-23 MED ORDER — AMOXICILLIN 500 MG PO CAPS
500.0000 mg | ORAL_CAPSULE | Freq: Two times a day (BID) | ORAL | 0 refills | Status: AC
  Filled 2020-12-23: qty 20, 10d supply, fill #0

## 2020-12-23 NOTE — Progress Notes (Signed)
E-Visit for Sore Throat - Strep Symptoms  We are sorry that you are not feeling well.  Here is how we plan to help!  Based on what you have shared with me it is likely that you have strep pharyngitis.  Strep pharyngitis is inflammation and infection in the back of the throat.  This is an infection cause by bacteria and is treated with antibiotics.  I have prescribed Amoxicillin 500 mg twice a day for 10 days. For throat pain, we recommend over the counter oral pain relief medications such as acetaminophen or aspirin, or anti-inflammatory medications such as ibuprofen or naproxen sodium. Topical treatments such as oral throat lozenges or sprays may be used as needed. Strep infections are not as easily transmitted as other respiratory infections, however we still recommend that you avoid close contact with loved ones, especially the very young and elderly.  Remember to wash your hands thoroughly throughout the day as this is the number one way to prevent the spread of infection and wipe down door knobs and counters with disinfectant.   Home Care: Only take medications as instructed by your medical team. Complete the entire course of an antibiotic. Do not take these medications with alcohol. A steam or ultrasonic humidifier can help congestion.  You can place a towel over your head and breathe in the steam from hot water coming from a faucet. Avoid close contacts especially the very young and the elderly. Cover your mouth when you cough or sneeze. Always remember to wash your hands.  Get Help Right Away If: You develop worsening fever or sinus pain. You develop a severe head ache or visual changes. Your symptoms persist after you have completed your treatment plan.  Make sure you Understand these instructions. Will watch your condition. Will get help right away if you are not doing well or get worse.   Thank you for choosing an e-visit.  Your e-visit answers were reviewed by a board  certified advanced clinical practitioner to complete your personal care plan. Depending upon the condition, your plan could have included both over the counter or prescription medications.  Please review your pharmacy choice. Make sure the pharmacy is open so you can pick up prescription now. If there is a problem, you may contact your provider through [employer] and have the prescription routed to another pharmacy.  Your safety is important to [place]. If you have drug allergies check your prescription carefully.   For the next 24 hours you can use MyChart to ask questions about today's visit, request a non-urgent call back, or ask for a work or school excuse. You will get an email in the next two days asking about your experience. I hope that your e-visit has been valuable and will speed your recovery.  I provided 6 minutes of non face-to-face time during this encounter for chart review and documentation.

## 2021-01-05 ENCOUNTER — Other Ambulatory Visit (HOSPITAL_COMMUNITY): Payer: Self-pay

## 2021-01-05 MED FILL — Levocetirizine Dihydrochloride Tab 5 MG: ORAL | 90 days supply | Qty: 90 | Fill #2 | Status: AC

## 2021-01-06 ENCOUNTER — Other Ambulatory Visit (HOSPITAL_COMMUNITY): Payer: Self-pay

## 2021-01-12 ENCOUNTER — Other Ambulatory Visit (HOSPITAL_COMMUNITY): Payer: Self-pay

## 2021-01-13 DIAGNOSIS — Z803 Family history of malignant neoplasm of breast: Secondary | ICD-10-CM | POA: Diagnosis not present

## 2021-01-13 DIAGNOSIS — Z9189 Other specified personal risk factors, not elsewhere classified: Secondary | ICD-10-CM | POA: Diagnosis not present

## 2021-01-21 ENCOUNTER — Encounter: Payer: Self-pay | Admitting: Gastroenterology

## 2021-01-27 ENCOUNTER — Other Ambulatory Visit (HOSPITAL_COMMUNITY): Payer: Self-pay

## 2021-02-02 ENCOUNTER — Encounter: Payer: Self-pay | Admitting: Neurology

## 2021-02-02 ENCOUNTER — Other Ambulatory Visit (HOSPITAL_COMMUNITY): Payer: Self-pay

## 2021-02-02 ENCOUNTER — Other Ambulatory Visit: Payer: Self-pay

## 2021-02-02 ENCOUNTER — Ambulatory Visit: Payer: 59 | Admitting: Neurology

## 2021-02-02 VITALS — BP 135/83 | HR 88 | Ht 61.5 in | Wt 277.5 lb

## 2021-02-02 DIAGNOSIS — G4733 Obstructive sleep apnea (adult) (pediatric): Secondary | ICD-10-CM | POA: Diagnosis not present

## 2021-02-02 DIAGNOSIS — G932 Benign intracranial hypertension: Secondary | ICD-10-CM | POA: Diagnosis not present

## 2021-02-02 MED ORDER — TROKENDI XR 200 MG PO CP24
1.0000 | ORAL_CAPSULE | Freq: Every evening | ORAL | 3 refills | Status: DC
  Filled 2021-02-02: qty 90, 90d supply, fill #0
  Filled 2021-06-16: qty 90, 90d supply, fill #1
  Filled 2021-09-15: qty 90, 90d supply, fill #2

## 2021-02-02 NOTE — Progress Notes (Signed)
[facility]  PATIENT: Denise Cook DOB: Sep 15, 1975  REFERRING CLINICIAN: Maury Dus, MD HISTORY FROM: Patient  REASON FOR VISIT: Headaches, history of IIH    HISTORICAL  CHIEF COMPLAINT:  Chief Complaint  Patient presents with   New Patient (Initial Visit)    RM 12, alone. Paper referral for pseudotumor cerebri. Pt reports mild headache. Sometimes gets dizzy throughout the day. Wears glasses. Has bilateral cataracts. Sees eye specialist at [facility].     HISTORY OF PRESENT ILLNESS:  This is a 45 year old woman with past medical history of pseudotumor cerebri, sarcoidosis, diabetes mellitus and sleep apnea who is presenting to establish care regarding her diagnosis of pseudotumor cerebri.  Patient stated that he has been diagnosed in 2015, has been prescribed on Diamox, currently she is taking Diamox 500 mg twice daily.  She had multiple lumbar punctures, a total of 6, reported lumbar puncture was abnormal, was told that her pressure was high.  Since diagnosis she continues to have headaches, but she reported they are not as bad as before, headache described as heaviness and achiness on top of head, they can last all day.  No photophobia or phonophobia, no migrainous feature but there is occasional visual obscuration present.  She also had a history of obesity and sleep apnea, stated that she qualify for weight loss surgery but she is scared of the surgery.  In regard of her sleep apnea she is not compliant with her CPap machine.    Headache History and Characteristics: Onset: 2015 Location: Top of head  Quality:  heaviness and achy  Intensity:4 /10.  Duration: can last a day  Migrainous Features: Photophobia, phonophobia, nausea, vomiting.  Aura: No  History of brain injury or tumor: No  Prior prophylaxis: Propranolol: No  Verapamil:No TCA: No Topamax: No Depakote: No Effexor: No Cymbalta: No Neurontin:No  Prior abortives: Triptan:  No Anti-emetic: No Steroids: No Ergotamine suppository: No   OTHER MEDICAL CONDITIONS: cataract uveitis, uveitis, DM, Hypersomnia, CKD, sarcoid    REVIEW OF SYSTEMS: Full 14 system review of systems performed and negative with exception of: As noted in the HPI.  ALLERGIES: Allergies  Allergen Reactions   Other Other (See Comments)    PEPPER sriracha turned tongue black    Metformin And Related Itching    HOME MEDICATIONS: Outpatient Medications Prior to Visit  Medication Sig Dispense Refill   acetaminophen (TYLENOL) 500 MG tablet Take 1,000 mg by mouth every 6 (six) hours as needed for headache. Reported on 07/06/2015     acetaZOLAMIDE ER (DIAMOX) 500 MG capsule Take 1 capsule (500 mg total) by mouth 2 (two) times daily. 180 capsule 1   albuterol (PROVENTIL HFA;VENTOLIN HFA) 108 (90 Base) MCG/ACT inhaler Inhale 2 puffs into the lungs every 6 (six) hours as needed for wheezing or shortness of breath.     albuterol (VENTOLIN HFA) 108 (90 Base) MCG/ACT inhaler Inhale into the lungs.     ARIPiprazole (ABILIFY) 5 MG tablet Take 1 tablet (5 mg total) by mouth at bedtime. 90 tablet 1   Armodafinil 250 MG tablet Take 1 tablet (250 mg total) by mouth daily. 90 tablet 1   atorvastatin (LIPITOR) 10 MG tablet Take 1 tablet (10 mg total) by mouth once a week. 13 tablet 3   azaTHIOprine (IMURAN) 50 MG tablet TAKE 4 TABLETS (200 MG TOTAL) BY MOUTH ONCE DAILY 360 tablet 0   Blood Glucose Monitoring Suppl (FIFTY50 GLUCOSE METER 2.0) w/Device KIT USE TO CHECK BLOOD SUGAR ONCE DAILY. ALTERNATING  MORNINGS AND EVENINGS BEFORE MEALS     brimonidine-timolol (COMBIGAN) 0.2-0.5 % ophthalmic solution Apply to eye.     budesonide-formoterol (SYMBICORT) 80-4.5 MCG/ACT inhaler Inhale into the lungs.     Cholecalciferol (VITAMIN D3) 5000 units CAPS Take 5,000 Units by mouth every morning.      DULoxetine (CYMBALTA) 60 MG capsule Take 1 capsule (60 mg total) by mouth daily. 90 capsule 1   levocetirizine (XYZAL) 5  MG tablet Take 5 mg by mouth 2 (two) times daily.      lisdexamfetamine (VYVANSE) 60 MG capsule Take 60 mg by mouth every morning.     Magnesium 250 MG TABS Take 250 mg by mouth 2 (two) times daily.      metoCLOPramide (REGLAN) 10 MG tablet Take 1 tablet (10 mg total) by mouth every 8 (eight) hours as needed for nausea. 15 tablet 0   montelukast (SINGULAIR) 10 MG tablet Take by mouth.     ondansetron (ZOFRAN) 8 MG tablet Take 4-8 mg by mouth every 8 (eight) hours as needed.     ondansetron (ZOFRAN) 8 MG tablet Take 1/2 to 1 tablet by mouth every 8 hours as needed for nausea 20 tablet 0   ONE TOUCH ULTRA TEST test strip USE TO CHECK BLOOD SUGAR ONCE DAILY. ALTERNATING MORNINGS AND EVENINGS BEFORE MEALS  3   oxybutynin (DITROPAN) 5 MG tablet Take 1 tablet (5 mg total) by mouth every 8 (eight) hours as needed for urinary incontinence 270 tablet 1   Semaglutide,0.25 or 0.5MG/DOS, (OZEMPIC, 0.25 OR 0.5 MG/DOSE,) 2 MG/1.5ML SOPN titrate 0.25 mg once weekly for 4 weeks then 0.5 mg once weekly for 2 weeks Subcutaneous once a week (Patient taking differently: Inject 0.5 mg into the skin once a week.) 4.5 mL 1   tiZANidine (ZANAFLEX) 2 MG tablet Take 1 tablet (2 mg total) by mouth every 8 (eight) hours as needed for muscle spasms. 15 tablet 0   acetaZOLAMIDE (DIAMOX) 500 MG capsule Take 1 capsule (500 mg total) by mouth 2 (two) times daily. 180 capsule 0   acetaZOLAMIDE (DIAMOX) 500 MG capsule TAKE 1 CAPSULE (500 MG TOTAL) BY MOUTH 2 (TWO) TIMES DAILY 90 capsule 3   Adalimumab (HUMIRA) 40 MG/0.8ML PSKT Inject 0.8 mLs (40 mg total) under the skin every 14  days as directed 9.6 mL 3   ARIPiprazole (ABILIFY) 5 MG tablet Take 5 mg by mouth daily.     ARIPiprazole (ABILIFY) 5 MG tablet TAKE 1 TABLET BY MOUTH EVERY NIGHT AT BEDTIME 90 tablet 0   Armodafinil 250 MG tablet      azaTHIOprine (IMURAN) 50 MG tablet Take 200 mg by mouth every evening.      azaTHIOprine (IMURAN) 50 MG tablet Take 4 tablets (200 mg total)  by mouth once daily 360 tablet 3   dorzolamide-timolol (COSOPT) 22.3-6.8 MG/ML ophthalmic solution Place 1 drop into both eyes 2 (two) times daily.     DULoxetine (CYMBALTA) 30 MG capsule Take 30 mg by mouth daily.      DULoxetine (CYMBALTA) 60 MG capsule Take 60 mg by mouth daily.     estradiol (VIVELLE-DOT) 0.05 MG/24HR patch Place 1 patch onto the skin 2 (two) times a week.  11   ibuprofen (ADVIL,MOTRIN) 800 MG tablet Take 1 tablet (800 mg total) by mouth 3 (three) times daily. 21 tablet 0   levocetirizine (XYZAL) 5 MG tablet TAKE 1 TABLET BY MOUTH EVERY MORNING AS NEEDED FOR ALLERGIES 360 tablet 0   lisdexamfetamine (VYVANSE) 40  MG capsule Take 1 capsule (40 mg total) by mouth in the morning. (Please fill 30 days after the previous Vyvanse fill) 30 capsule 0   lisdexamfetamine (VYVANSE) 40 MG capsule Take 1 capsule (40 mg total) by mouth in the morning.  (Please fill 30 days after the previous Vyvanse fill) 30 capsule 0   lisdexamfetamine (VYVANSE) 40 MG capsule Take 1 capsule (40 mg total) by mouth in the morning. 30 capsule 0   lisdexamfetamine (VYVANSE) 40 MG capsule Take 1 capsule (40 mg total) by mouth every morning. 30 capsule 0   lisdexamfetamine (VYVANSE) 40 MG capsule Take 1 capsule (40 mg total) by mouth every morning. 30 capsule 0   lisdexamfetamine (VYVANSE) 40 MG capsule Take 1 capsule (40 mg total) by mouth every morning. 30 capsule 0   lisdexamfetamine (VYVANSE) 50 MG capsule Take 60 mg by mouth every morning.      methocarbamol (ROBAXIN) 500 MG tablet Take 2 tablets (1,000 mg total) by mouth 4 (four) times daily. 20 tablet 0   TRULICITY 1.5 CW/8.8QB SOPN Inject 0.5 mLs into the skin every 7 (seven) days.      No facility-administered medications prior to visit.    PAST MEDICAL HISTORY: Past Medical History:  Diagnosis Date   Allergy    Asthma    Depression    Diabetes mellitus    diet control   Glaucoma    Obesity    Pseudotumor cerebri    Sarcoidosis 2012     PAST SURGICAL HISTORY: Past Surgical History:  Procedure Laterality Date   ABDOMINAL HYSTERECTOMY     CARDIAC CATHETERIZATION     CHOLECYSTECTOMY     EYE SURGERY Left    Removed vitreous membrane   glaucoma shunt     INCISION AND DRAINAGE Right 05/01/2014   Procedure: INCISION AND DRAINAGE RIGHT INDEX FLEXOR SHEATH  ;  Surgeon: Charlotte Crumb, MD;  Location: [place];  Service: Orthopedics;  Laterality: Right;   left eye surgery      FAMILY HISTORY: Family History  Adopted: Yes  Problem Relation Age of Onset   Healthy Daughter    Cancer Maternal Grandmother    Alcoholism Mother    Diabetes Paternal Grandmother     SOCIAL HISTORY: Social History   Socioeconomic History   Marital status: Single    Spouse name: Not on file   Number of children: 1   Years of education: Not on file   Highest education level: Not on file  Occupational History   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   She lives alone.  She has one grown daughter (31).   Social Determinants of Health   Financial Resource Strain: Not on file  Food Insecurity: Not on file  Transportation Needs: Not on file  Physical Activity: Not on file  Stress: Not on file  Social Connections: Not on file  Intimate Partner Violence: Not on file     PHYSICAL EXAM   GENERAL EXAM/CONSTITUTIONAL: Vitals:  Vitals:  02/02/21 0816  BP: 135/83  Pulse: 88  Weight: 277 lb 8 oz (125.9 kg)  Height: 5' 1.5" (1.562 m)   Body mass index is 51.58  kg/m. Wt Readings from Last 3 Encounters:  02/02/21 277 lb 8 oz (125.9 kg)  04/21/19 300 lb (136.1 kg)  05/27/18 260 lb (117.9 kg)   Patient is in no distress; well developed, nourished and groomed; neck is supple. Obese patient   EYES: Pupils round and reactive to light, Visual fields full to confrontation, Extraocular movements intacts,   MUSCULOSKELETAL: Gait, strength, tone, movements noted in Neurologic exam below  NEUROLOGIC: MENTAL STATUS:  No flowsheet data found. awake, alert, oriented to person, place and time recent and remote memory intact normal attention and concentration language fluent, comprehension intact, naming intact fund of knowledge appropriate  CRANIAL NERVE:  Funduscopy attempted but unable to visualize 2nd, 3rd, 4th, 6th - pupils equal and reactive to light, visual fields full to confrontation, extraocular muscles intact, no nystagmus 5th - facial sensation symmetric 7th - facial strength symmetric 8th - hearing intact 9th - palate elevates symmetrically, uvula midline 11th - shoulder shrug symmetric 12th - tongue protrusion midline  MOTOR:  normal bulk and tone, full strength in the BUE, BLE  SENSORY:  normal and symmetric to light touch, pinprick, temperature, vibration  COORDINATION:  finger-nose-finger, fine finger movements normal  REFLEXES:  deep tendon reflexes present and symmetric  GAIT/STATION:  normal   DIAGNOSTIC DATA (LABS, IMAGING, TESTING) - I reviewed patient records, labs, notes, testing and imaging myself where available.  Lab Results  Component Value Date   WBC 9.6 02/11/2018   HGB 12.4 02/11/2018   HCT 39.4 02/11/2018   MCV 94.9 02/11/2018   PLT 424 (H) 02/11/2018      Component Value Date/Time   NA 140 02/11/2018 2052   K 3.5 02/11/2018 2052   CL 107 02/11/2018 2052   CO2 25 02/11/2018 2052   GLUCOSE 91 02/11/2018 2052   BUN 10 02/11/2018 2052   CREATININE 1.09 (H) 02/11/2018 2052   CREATININE 1.03  07/16/2017 1126   CALCIUM 9.1 02/11/2018 2052   PROT 7.6 02/11/2018 2052   ALBUMIN 4.2 02/11/2018 2052   AST 21 02/11/2018 2052   ALT 17 02/11/2018 2052   ALKPHOS 62 02/11/2018 2052   BILITOT 0.7 02/11/2018 2052   GFRNONAA >60 02/11/2018 2052   GFRNONAA 76 05/26/2013 1840   GFRAA >60 02/11/2018 2052   GFRAA 88 05/26/2013 1840   Lab Results  Component Value Date   CHOL  07/05/2009    124        ATP III CLASSIFICATION:  <200     mg/dL   Desirable  200-239  mg/dL   Borderline High  >=240    mg/dL   High          HDL 37 (L) 07/05/2009   LDLCALC  07/05/2009    61        Total Cholesterol/HDL:CHD Risk Coronary Heart Disease Risk Table                     Men   Women  1/2 Average Risk   3.4   3.3  Average Risk       5.0   4.4  2 X Average Risk   9.6   7.1  3 X Average Risk  23.4   11.0        Use the calculated Patient Ratio above and the CHD Risk  Table to determine the patient's CHD Risk.        ATP III CLASSIFICATION (LDL):  <100     mg/dL   Optimal  100-129  mg/dL   Near or Above                    Optimal  130-159  mg/dL   Borderline  160-189  mg/dL   High  >190     mg/dL   Very High   TRIG 130 07/05/2009   CHOLHDL 3.4 07/05/2009   Lab Results  Component Value Date   HGBA1C (H) 07/05/2009    6.7 (NOTE) The ADA recommends the following therapeutic goal for glycemic control related to Hgb A1c measurement: Goal of therapy: <6.5 Hgb A1c  Reference: American Diabetes Association: Clinical Practice Recommendations 2010, Diabetes Care, 2010, 33: (Suppl  1).   Lab Results  Component Value Date   [id number] 567 07/16/2017   Lab Results  Component Value Date   TSH  07/05/2009    1.103 (NOTE) Please note change in reference ranges for ages 26W to 73Y. Test methodology is 3rd generation TSH    Head CT 2022: No acute intracranial abnormalities. Empty Sella   MRI Brain 2019 1.  No acute intracranial abnormality. 2. No abnormal intracranial enhancement or dural  thickening to suggest neurosarcoidosis. Mild nonspecific subcortical cerebral white matter signal changes may have progressed since 2015. 3. Chronic partially empty sella and prominence of the optic nerve root sleeves again compatible with idiopathic intracranial hypertension (pseudotumor cerebri). 4. Mild bilateral maxillary sinus inflammation with small fluid levels is new since 2018.   I personally review the Brain Images.     ASSESSMENT AND PLAN  45 y.o. year old female with past medical history of idiopathic intracranial hypertension, obesity, diabetes mellitus, sarcoidosis and sleep apnea who is presenting for management of her diagnosis of idiopathic intracranial hypertension.  Patient reported she currently still have headaches, on average she have headaches every 2 to 3 days and they can last the entire day.  She is compliant with her Diamox 500 mg twice daily.  Due to obesity I will start the patient on topiramate XR 200 mg nightly and have her continue Diamox 500 mg twice daily in hopes that the topiramate XR can help her with her weight management.  I will see her in 3 months for follow-up, if there are no improvement in her headaches I will then increase her Diamox to 500 mg 3 times daily. I also advised her about CPAP compliance   1. Idiopathic intracranial hypertension   2. Obstructive sleep apnea syndrome   3. Morbid obesity (HCC)     PLAN: Continue Diamox 500 mg twice daily  Start Trokendi 200 mg night nightly  Return in 3 months   No orders of the defined types were placed in this encounter.   Meds ordered this encounter  Medications   Topiramate ER (TROKENDI XR) 200 MG CP24    Sig: Take 1 capsule by mouth at bedtime.    Dispense:  90 capsule    Refill:  3    Return in about 3 months (around 05/05/2021).    Alric Ran, MD 02/02/2021, 10:41 AM  [facility] [address] [place], [place] [address] [phone]

## 2021-02-02 NOTE — Patient Instructions (Signed)
Continue Diamox 500 mg twice daily  Start Trokendi 200 mg night nightly  Return in 3 months

## 2021-02-14 ENCOUNTER — Other Ambulatory Visit (HOSPITAL_COMMUNITY): Payer: Self-pay

## 2021-02-15 ENCOUNTER — Other Ambulatory Visit (HOSPITAL_COMMUNITY): Payer: Self-pay

## 2021-02-16 ENCOUNTER — Other Ambulatory Visit (HOSPITAL_COMMUNITY): Payer: Self-pay

## 2021-02-16 MED ORDER — VYVANSE 40 MG PO CAPS
40.0000 mg | ORAL_CAPSULE | Freq: Every morning | ORAL | 0 refills | Status: DC
  Filled 2021-02-16: qty 30, 30d supply, fill #0

## 2021-02-18 ENCOUNTER — Other Ambulatory Visit: Payer: Self-pay

## 2021-02-18 ENCOUNTER — Other Ambulatory Visit (HOSPITAL_COMMUNITY): Payer: Self-pay

## 2021-02-18 ENCOUNTER — Ambulatory Visit
Admission: EM | Admit: 2021-02-18 | Discharge: 2021-02-18 | Disposition: A | Discharge status: Home / Self Care | Payer: 59 | Attending: Physician Assistant | Admitting: Physician Assistant

## 2021-02-18 ENCOUNTER — Encounter: Payer: Self-pay | Admitting: Emergency Medicine

## 2021-02-18 DIAGNOSIS — J0120 Acute ethmoidal sinusitis, unspecified: Secondary | ICD-10-CM

## 2021-02-18 LAB — POCT INFLUENZA A/B
Influenza A, POC: NEGATIVE
Influenza B, POC: NEGATIVE

## 2021-02-18 MED ORDER — AMOXICILLIN-POT CLAVULANATE 875-125 MG PO TABS
1.0000 | ORAL_TABLET | Freq: Two times a day (BID) | ORAL | 0 refills | Status: AC
  Filled 2021-02-18: qty 20, 10d supply, fill #0

## 2021-02-18 NOTE — ED Triage Notes (Addendum)
Wednesday began having nasal congestion, productive cough with yellow mucus, facial tenderness, generalized body aches, and states mild nausea and diarrhea. Denies fever. Negative covid test at home.

## 2021-02-18 NOTE — Discharge Instructions (Addendum)
Return if any problems.

## 2021-02-18 NOTE — ED Provider Notes (Signed)
[name]    CSN: [id number] Arrival date & time: 02/18/21  3903      History   Chief Complaint Chief Complaint  Patient presents with   URI    HPI Denise Cook is a 45 y.o. female.   The history is provided by the patient. No language interpreter was used.  URI Presenting symptoms: congestion and cough   Cough:    Cough characteristics:  Non-productive   Sputum characteristics:  Nondescript   Severity:  Moderate   Onset quality:  Gradual   Duration:  3 days   Progression:  Worsening   Chronicity:  New Severity:  Moderate Onset quality:  Gradual Duration:  3 days Timing:  Constant Progression:  Worsening Chronicity:  New Relieved by:  Nothing Ineffective treatments:  None tried Risk factors: no sick contacts    Past Medical History:  Diagnosis Date   Allergy    Asthma    Depression    Diabetes mellitus    diet control   Glaucoma    Obesity    Pseudotumor cerebri    Sarcoidosis 2012    Patient Active Problem List   Diagnosis Date Noted   Bipolar 1 disorder ([place]) 04/29/2019   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   PVD (posterior vitreous detachment), bilateral 09/20/2018   Borderline diabetes 09/20/2018   Paresthesias 09/12/2018   Lipoma of left upper extremity 05/04/2017   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, right eye, mild stage 05/14/2015   Sarcoid 12/18/2014   Encounter for long-term (current) use of high-risk medication 12/18/2014   Uveitic glaucoma of both eyes, 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 11/20/2014   Panuveitis, 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      Home Medications    Prior to Admission medications   Medication Sig Start Date End Date Taking? Authorizing Provider  acetaminophen (TYLENOL) 500 MG tablet Take 1,000 mg by mouth every 6 (six) hours as needed for headache. Reported on 07/06/2015    [provider]  acetaZOLAMIDE ER (DIAMOX) 500 MG capsule Take 1 capsule (500 mg total) by mouth 2 (two) times daily. 09/06/20     albuterol (PROVENTIL HFA;VENTOLIN HFA) 108 (90 Base) MCG/ACT inhaler Inhale 2 puffs into the lungs every 6 (six) hours as needed for wheezing or shortness of breath.    [provider]  albuterol (VENTOLIN HFA) 108 (90 Base) MCG/ACT inhaler Inhale into the lungs.    [provider]  ARIPiprazole (ABILIFY) 5 MG tablet Take 1 tablet (5 mg total) by mouth at bedtime. 09/06/20     Armodafinil 250 MG tablet Take 1 tablet (250 mg total) by mouth daily. 09/30/20     atorvastatin (LIPITOR) 10 MG tablet Take 1 tablet (10 mg total) by mouth once a week. 07/08/20     azaTHIOprine (IMURAN) 50 MG tablet TAKE 4 TABLETS (200  MG TOTAL) BY MOUTH ONCE DAILY 04/09/20 04/09/21  Ian Malkin, MD  Blood Glucose Monitoring Suppl (FIFTY50 GLUCOSE METER 2.0) w/Device KIT USE TO CHECK BLOOD SUGAR ONCE DAILY. ALTERNATING MORNINGS AND EVENINGS BEFORE MEALS 08/20/17   [provider]  brimonidine-timolol (COMBIGAN) 0.2-0.5 % ophthalmic solution Apply to eye. 09/20/18   [provider]  budesonide-formoterol (SYMBICORT) 80-4.5 MCG/ACT inhaler Inhale into the lungs.    [provider]  Cholecalciferol (VITAMIN D3) 5000 units CAPS Take 5,000 Units by mouth every morning.     [provider]  DULoxetine (CYMBALTA) 60 MG capsule Take 1 capsule (60 mg total) by mouth daily. 09/06/20      levocetirizine (XYZAL) 5 MG tablet Take 5 mg by mouth 2 (two) times daily.     [provider]  lisdexamfetamine (VYVANSE) 40 MG capsule Take 1 capsule (40 mg total) by mouth every morning. 02/16/21     lisdexamfetamine (VYVANSE) 60 MG capsule Take 60 mg by mouth every morning.    [provider]  Magnesium 250 MG TABS Take 250 mg by mouth 2 (two) times daily.     [provider]  metoCLOPramide (REGLAN) 10 MG tablet Take 1 tablet (10 mg total) by mouth every 8 (eight) hours as needed for nausea. 11/26/19   Tasia Catchings, Amy V, PA-C  montelukast (SINGULAIR) 10 MG tablet Take by mouth.    [provider]  ondansetron (ZOFRAN) 8 MG tablet Take 4-8 mg by mouth every 8 (eight) hours as needed. 11/13/18   [provider]  ondansetron (ZOFRAN) 8 MG tablet Take 1/2 to 1 tablet by mouth every 8 hours as needed for nausea 08/10/20   Maury Dus, MD  ONE TOUCH ULTRA TEST test strip USE TO CHECK BLOOD SUGAR ONCE DAILY. ALTERNATING MORNINGS AND EVENINGS BEFORE MEALS 08/20/17   [provider]  oxybutynin (DITROPAN) 5 MG tablet Take 1 tablet (5 mg total) by mouth every 8 (eight) hours as needed for urinary incontinence 09/06/20     Semaglutide,0.25 or 0.5MG/DOS, (OZEMPIC, 0.25 OR 0.5 MG/DOSE,) 2 MG/1.5ML SOPN titrate 0.25 mg once weekly for 4 weeks then 0.5 mg once weekly for 2 weeks Subcutaneous once a week Patient taking differently: Inject 0.5 mg into the skin once a week. 09/06/20     tiZANidine (ZANAFLEX) 2 MG tablet Take 1 tablet (2 mg total) by mouth every 8 (eight) hours as needed for muscle spasms. 11/26/19   Tasia Catchings, Amy V, PA-C  Topiramate ER (TROKENDI XR) 200 MG CP24 Take 1 capsule by mouth at bedtime. 02/02/21   Alric Ran, MD    Family History Family History  Adopted: Yes  Problem Relation Age of Onset   Healthy Daughter    Cancer Maternal Grandmother    Alcoholism Mother    Diabetes Paternal Grandmother     Social History Social History   Tobacco  Use   Smoking status: Never   Smokeless tobacco: Never  Vaping Use   Vaping Use: Never used  Substance Use Topics   Alcohol use: No   Drug use: No     Allergies   Other and Metformin and related   Review of Systems Review of Systems  HENT:  Positive for congestion.   Respiratory:  Positive for cough.   All other systems reviewed and are negative.   Physical Exam Triage Vital Signs ED Triage Vitals [02/18/21 1206]  Enc Vitals Group     BP 130/85     Pulse Rate 83  Resp 16     Temp 97.8 F (36.6 C)     Temp Source Oral     SpO2 100 %     Weight      Height      Head Circumference      Peak Flow      Pain Score 4     Pain Loc      Pain Edu?      Excl. in [place]?    No data found.  Updated Vital Signs BP 130/85 (BP Location: Left Arm)   Pulse 83   Temp 97.8 F (36.6 C) (Oral)   Resp 16   SpO2 100%   Visual Acuity Right Eye Distance:   Left Eye Distance:   Bilateral Distance:    Right Eye Near:   Left Eye Near:    Bilateral Near:     Physical Exam Constitutional:      Appearance: She is well-developed.  HENT:     Head: Normocephalic and atraumatic.     Mouth/Throat:     Pharynx: Posterior oropharyngeal erythema present.  Eyes:     Conjunctiva/sclera: Conjunctivae normal.     Pupils: Pupils are equal, round, and reactive to light.  Pulmonary:     Effort: Pulmonary effort is normal.  Abdominal:     Palpations: Abdomen is soft.  Musculoskeletal:        General: Normal range of motion.     Cervical back: Normal range of motion and neck supple.  Skin:    General: Skin is warm and dry.  Neurological:     Mental Status: She is alert and oriented to person, place, and time.     UC Treatments / Results  Labs (all labs ordered are listed, but only abnormal results are displayed) Labs Reviewed  POCT INFLUENZA A/B    EKG   Radiology No results found.  Procedures Procedures (including critical care time)  Medications Ordered in  UC Medications - No data to display  Initial Impression / Assessment and Plan / UC Course  I have reviewed the triage vital signs and the nursing notes.  Pertinent labs & imaging results that were available during my care of the patient were reviewed by me and considered in my medical decision making (see chart for details).      Final Clinical Impressions(s) / UC Diagnoses   Final diagnoses:  Acute ethmoidal sinusitis, recurrence not specified   Discharge Instructions   None    ED Prescriptions     Medication Sig Dispense Auth. Provider   amoxicillin-clavulanate (AUGMENTIN) 875-125 MG tablet Take 1 tablet by mouth 2 (two) times daily for 10 days. 20 tablet Fransico Meadow, [place]      PDMP not reviewed this encounter.   Fransico Meadow, [place] 02/20/21 1406

## 2021-02-21 ENCOUNTER — Other Ambulatory Visit: Payer: Self-pay

## 2021-02-21 ENCOUNTER — Other Ambulatory Visit (HOSPITAL_COMMUNITY): Payer: Self-pay

## 2021-02-22 ENCOUNTER — Telehealth: Payer: Self-pay | Admitting: *Deleted

## 2021-02-22 NOTE — Telephone Encounter (Signed)
Dr. Candis Schatz,  This pt has a BMI of 51.59.  She is coming in for a screening colonoscopy.  Would you like her to be scheduled at the hospital directly or would you like an OV first?  Please advise,  Thanks, Cyril Mourning

## 2021-02-25 ENCOUNTER — Other Ambulatory Visit (HOSPITAL_COMMUNITY): Payer: Self-pay

## 2021-02-28 ENCOUNTER — Other Ambulatory Visit (HOSPITAL_COMMUNITY): Payer: Self-pay

## 2021-03-01 ENCOUNTER — Other Ambulatory Visit (HOSPITAL_COMMUNITY): Payer: Self-pay

## 2021-03-02 ENCOUNTER — Other Ambulatory Visit (HOSPITAL_COMMUNITY): Payer: Self-pay

## 2021-03-03 ENCOUNTER — Other Ambulatory Visit (HOSPITAL_COMMUNITY): Payer: Self-pay

## 2021-03-03 MED ORDER — OZEMPIC (0.25 OR 0.5 MG/DOSE) 2 MG/1.5ML ~~LOC~~ SOPN
0.5000 mg | PEN_INJECTOR | SUBCUTANEOUS | 2 refills | Status: DC
  Filled 2021-03-03: qty 1.5, 28d supply, fill #0

## 2021-03-08 NOTE — Telephone Encounter (Signed)
Dr. Candis Schatz you do not have any hospital slots available. It does not look like she has been seen by anyone else. Are you ok with her being scheduled with another provider? If so Dr. Ardis Hughs has 1/23 and 1/24. Please advise.

## 2021-03-09 NOTE — Telephone Encounter (Signed)
Called patient, no answer. Left a message for her to call back.

## 2021-03-09 NOTE — Telephone Encounter (Signed)
Patient added to hospital list.

## 2021-03-09 NOTE — Telephone Encounter (Signed)
Maya do you have a list of patients that need to be scheduled once he has more hospital slots? If so will you add Ms. Dimmitt to the list?

## 2021-03-10 NOTE — Telephone Encounter (Signed)
Spoke with the patient, she request to cancel her colonoscopy because her PCP referred her to [facility] not . Colon and PV cancelled at this time.

## 2021-03-11 ENCOUNTER — Other Ambulatory Visit (HOSPITAL_COMMUNITY): Payer: Self-pay

## 2021-03-11 DIAGNOSIS — H40041 Steroid responder, right eye: Secondary | ICD-10-CM | POA: Diagnosis not present

## 2021-03-11 DIAGNOSIS — G932 Benign intracranial hypertension: Secondary | ICD-10-CM | POA: Diagnosis not present

## 2021-03-11 DIAGNOSIS — M7711 Lateral epicondylitis, right elbow: Secondary | ICD-10-CM | POA: Diagnosis not present

## 2021-03-11 DIAGNOSIS — H43813 Vitreous degeneration, bilateral: Secondary | ICD-10-CM | POA: Diagnosis not present

## 2021-03-11 DIAGNOSIS — Z79899 Other long term (current) drug therapy: Secondary | ICD-10-CM | POA: Diagnosis not present

## 2021-03-11 DIAGNOSIS — H44113 Panuveitis, bilateral: Secondary | ICD-10-CM | POA: Diagnosis not present

## 2021-03-11 DIAGNOSIS — R7303 Prediabetes: Secondary | ICD-10-CM | POA: Diagnosis not present

## 2021-03-11 DIAGNOSIS — D869 Sarcoidosis, unspecified: Secondary | ICD-10-CM | POA: Diagnosis not present

## 2021-03-11 MED ORDER — BRIMONIDINE TARTRATE 0.2 % OP SOLN
1.0000 [drp] | Freq: Three times a day (TID) | OPHTHALMIC | 12 refills | Status: DC
  Filled 2021-03-11: qty 5, 34d supply, fill #0
  Filled 2021-06-16: qty 5, 34d supply, fill #1
  Filled 2021-09-15: qty 5, 34d supply, fill #2
  Filled 2022-02-22: qty 5, 34d supply, fill #3

## 2021-03-15 ENCOUNTER — Other Ambulatory Visit (HOSPITAL_COMMUNITY): Payer: Self-pay

## 2021-03-15 DIAGNOSIS — Z13220 Encounter for screening for lipoid disorders: Secondary | ICD-10-CM | POA: Diagnosis not present

## 2021-03-15 DIAGNOSIS — G932 Benign intracranial hypertension: Secondary | ICD-10-CM | POA: Diagnosis not present

## 2021-03-15 DIAGNOSIS — J45991 Cough variant asthma: Secondary | ICD-10-CM | POA: Diagnosis not present

## 2021-03-15 DIAGNOSIS — Z0000 Encounter for general adult medical examination without abnormal findings: Secondary | ICD-10-CM | POA: Diagnosis not present

## 2021-03-15 DIAGNOSIS — J309 Allergic rhinitis, unspecified: Secondary | ICD-10-CM | POA: Diagnosis not present

## 2021-03-15 DIAGNOSIS — Z6841 Body Mass Index (BMI) 40.0 and over, adult: Secondary | ICD-10-CM | POA: Diagnosis not present

## 2021-03-15 DIAGNOSIS — M25562 Pain in left knee: Secondary | ICD-10-CM | POA: Diagnosis not present

## 2021-03-15 DIAGNOSIS — F3181 Bipolar II disorder: Secondary | ICD-10-CM | POA: Diagnosis not present

## 2021-03-15 DIAGNOSIS — G4733 Obstructive sleep apnea (adult) (pediatric): Secondary | ICD-10-CM | POA: Diagnosis not present

## 2021-03-15 DIAGNOSIS — E1165 Type 2 diabetes mellitus with hyperglycemia: Secondary | ICD-10-CM | POA: Diagnosis not present

## 2021-03-15 MED ORDER — ALBUTEROL SULFATE HFA 108 (90 BASE) MCG/ACT IN AERS
1.0000 | INHALATION_SPRAY | RESPIRATORY_TRACT | 5 refills | Status: DC | PRN
  Filled 2021-03-15: qty 18, 17d supply, fill #0
  Filled 2021-07-14: qty 18, 17d supply, fill #1
  Filled 2022-01-13 – 2022-02-22 (×2): qty 18, 17d supply, fill #2

## 2021-03-15 MED ORDER — ACETAZOLAMIDE ER 500 MG PO CP12
500.0000 mg | ORAL_CAPSULE | Freq: Two times a day (BID) | ORAL | 1 refills | Status: DC
  Filled 2021-03-15: qty 180, 90d supply, fill #0

## 2021-03-15 MED ORDER — ARIPIPRAZOLE 5 MG PO TABS
5.0000 mg | ORAL_TABLET | Freq: Every day | ORAL | 1 refills | Status: DC
  Filled 2021-03-15 – 2021-04-08 (×2): qty 90, 90d supply, fill #0
  Filled 2021-07-14: qty 90, 90d supply, fill #1

## 2021-03-15 MED ORDER — VYVANSE 40 MG PO CAPS
40.0000 mg | ORAL_CAPSULE | Freq: Every morning | ORAL | 0 refills | Status: DC
  Filled 2021-03-15 – 2021-03-24 (×2): qty 90, 90d supply, fill #0

## 2021-03-15 MED ORDER — DULOXETINE HCL 60 MG PO CPEP
60.0000 mg | ORAL_CAPSULE | Freq: Every day | ORAL | 1 refills | Status: DC
  Filled 2021-03-15 – 2021-04-08 (×2): qty 90, 90d supply, fill #0
  Filled 2021-06-16 – 2021-07-14 (×2): qty 90, 90d supply, fill #1

## 2021-03-15 MED ORDER — ONDANSETRON HCL 4 MG PO TABS
4.0000 mg | ORAL_TABLET | Freq: Three times a day (TID) | ORAL | 2 refills | Status: AC | PRN
  Filled 2021-03-15 – 2021-06-16 (×3): qty 30, 10d supply, fill #0

## 2021-03-15 MED ORDER — MONTELUKAST SODIUM 10 MG PO TABS
10.0000 mg | ORAL_TABLET | Freq: Every evening | ORAL | 1 refills | Status: DC
  Filled 2021-03-15 – 2021-07-14 (×2): qty 90, 90d supply, fill #0
  Filled 2022-02-22: qty 90, 90d supply, fill #1

## 2021-03-15 MED ORDER — OZEMPIC (0.25 OR 0.5 MG/DOSE) 2 MG/1.5ML ~~LOC~~ SOPN
PEN_INJECTOR | SUBCUTANEOUS | 1 refills | Status: AC
  Filled 2021-03-15: qty 1.5, 28d supply, fill #0
  Filled 2021-04-08: qty 4.5, fill #0
  Filled 2021-06-16: qty 1.5, 42d supply, fill #0

## 2021-03-16 ENCOUNTER — Other Ambulatory Visit (HOSPITAL_COMMUNITY): Payer: Self-pay

## 2021-03-16 ENCOUNTER — Encounter (HOSPITAL_BASED_OUTPATIENT_CLINIC_OR_DEPARTMENT_OTHER): Payer: Self-pay

## 2021-03-16 ENCOUNTER — Emergency Department (HOSPITAL_BASED_OUTPATIENT_CLINIC_OR_DEPARTMENT_OTHER)
Admission: EM | Admit: 2021-03-16 | Discharge: 2021-03-16 | Disposition: A | Discharge status: Home / Self Care | Payer: 59 | Attending: Emergency Medicine | Admitting: Emergency Medicine

## 2021-03-16 ENCOUNTER — Other Ambulatory Visit: Payer: Self-pay

## 2021-03-16 ENCOUNTER — Other Ambulatory Visit (HOSPITAL_BASED_OUTPATIENT_CLINIC_OR_DEPARTMENT_OTHER): Payer: Self-pay

## 2021-03-16 ENCOUNTER — Emergency Department (HOSPITAL_BASED_OUTPATIENT_CLINIC_OR_DEPARTMENT_OTHER): Payer: 59

## 2021-03-16 DIAGNOSIS — U071 COVID-19: Secondary | ICD-10-CM | POA: Diagnosis not present

## 2021-03-16 DIAGNOSIS — Z79899 Other long term (current) drug therapy: Secondary | ICD-10-CM | POA: Insufficient documentation

## 2021-03-16 DIAGNOSIS — E119 Type 2 diabetes mellitus without complications: Secondary | ICD-10-CM | POA: Diagnosis not present

## 2021-03-16 DIAGNOSIS — J45909 Unspecified asthma, uncomplicated: Secondary | ICD-10-CM | POA: Diagnosis not present

## 2021-03-16 DIAGNOSIS — R059 Cough, unspecified: Secondary | ICD-10-CM | POA: Diagnosis present

## 2021-03-16 DIAGNOSIS — R0602 Shortness of breath: Secondary | ICD-10-CM | POA: Diagnosis not present

## 2021-03-16 LAB — COMPREHENSIVE METABOLIC PANEL
ALT: 16 U/L (ref 0–44)
AST: 24 U/L (ref 15–41)
Albumin: 3.6 g/dL (ref 3.5–5.0)
Alkaline Phosphatase: 74 U/L (ref 38–126)
Anion gap: 9 (ref 5–15)
BUN: 7 mg/dL (ref 6–20)
CO2: 21 mmol/L — ABNORMAL LOW (ref 22–32)
Calcium: 8.5 mg/dL — ABNORMAL LOW (ref 8.9–10.3)
Chloride: 106 mmol/L (ref 98–111)
Creatinine, Ser: 0.94 mg/dL (ref 0.44–1.00)
GFR, Estimated: >60 mL/min (ref >60)
Glucose, Bld: 103 mg/dL — ABNORMAL HIGH (ref 70–99)
Potassium: 3.4 mmol/L — ABNORMAL LOW (ref 3.5–5.1)
Sodium: 136 mmol/L (ref 135–145)
Total Bilirubin: 0.2 mg/dL — ABNORMAL LOW (ref 0.3–1.2)
Total Protein: 7.3 g/dL (ref 6.5–8.1)

## 2021-03-16 LAB — CBC WITH DIFFERENTIAL/PLATELET
Abs Immature Granulocytes: 0.05 10*3/uL (ref 0.00–0.07)
Basophils Absolute: 0 10*3/uL (ref 0.0–0.1)
Basophils Relative: 0 %
Eosinophils Absolute: 0 10*3/uL (ref 0.0–0.5)
Eosinophils Relative: 0 %
HCT: 32.6 % — ABNORMAL LOW (ref 36.0–46.0)
Hemoglobin: 10.9 g/dL — ABNORMAL LOW (ref 12.0–15.0)
Immature Granulocytes: 1 %
Lymphocytes Relative: 9 %
Lymphs Abs: 0.7 10*3/uL (ref 0.7–4.0)
MCH: 28.5 pg (ref 26.0–34.0)
MCHC: 33.4 g/dL (ref 30.0–36.0)
MCV: 85.3 fL (ref 80.0–100.0)
Monocytes Absolute: 0.7 10*3/uL (ref 0.1–1.0)
Monocytes Relative: 9 %
Neutro Abs: 6.1 10*3/uL (ref 1.7–7.7)
Neutrophils Relative %: 81 %
Platelets: 346 10*3/uL (ref 150–400)
RBC: 3.82 MIL/uL — ABNORMAL LOW (ref 3.87–5.11)
RDW: 16.3 % — ABNORMAL HIGH (ref 11.5–15.5)
WBC: 7.6 10*3/uL (ref 4.0–10.5)
nRBC: 0 % (ref 0.0–0.2)

## 2021-03-16 LAB — D-DIMER, QUANTITATIVE: D-Dimer, Quant: 0.42 ug/mL-FEU (ref 0.00–0.50)

## 2021-03-16 LAB — TROPONIN I (HIGH SENSITIVITY): Troponin I (High Sensitivity): 2 ng/L (ref <18)

## 2021-03-16 MED ORDER — NIRMATRELVIR/RITONAVIR (PAXLOVID)TABLET
3.0000 | ORAL_TABLET | Freq: Two times a day (BID) | ORAL | 0 refills | Status: DC
  Filled 2021-03-16: qty 30, 5d supply, fill #0

## 2021-03-16 MED ORDER — SODIUM CHLORIDE 0.9 % IV BOLUS
1000.0000 mL | Freq: Once | INTRAVENOUS | Status: AC
  Administered 2021-03-16: 15:00:00 1000 mL via INTRAVENOUS

## 2021-03-16 MED ORDER — IBUPROFEN 800 MG PO TABS
800.0000 mg | ORAL_TABLET | Freq: Once | ORAL | Status: AC
  Administered 2021-03-16: 13:00:00 800 mg via ORAL
  Filled 2021-03-16: qty 1

## 2021-03-16 MED ORDER — NIRMATRELVIR/RITONAVIR (PAXLOVID)TABLET
3.0000 | ORAL_TABLET | Freq: Two times a day (BID) | ORAL | 0 refills | Status: AC
  Filled 2021-03-16: qty 30, 5d supply, fill #0

## 2021-03-16 MED ORDER — SODIUM CHLORIDE 0.9 % IV BOLUS
1000.0000 mL | Freq: Once | INTRAVENOUS | Status: AC
  Administered 2021-03-16: 14:00:00 1000 mL via INTRAVENOUS

## 2021-03-16 MED ORDER — ACETAMINOPHEN 325 MG PO TABS
650.0000 mg | ORAL_TABLET | Freq: Once | ORAL | Status: AC
  Administered 2021-03-16: 14:00:00 650 mg via ORAL
  Filled 2021-03-16: qty 2

## 2021-03-16 MED ORDER — ONDANSETRON HCL 4 MG/2ML IJ SOLN
4.0000 mg | Freq: Once | INTRAMUSCULAR | Status: AC
  Administered 2021-03-16: 14:00:00 4 mg via INTRAVENOUS
  Filled 2021-03-16: qty 2

## 2021-03-16 NOTE — ED Provider Notes (Signed)
[place] EMERGENCY DEPARTMENT Provider Note   CSN: [id number] Arrival date & time: 03/16/21  1217     History Chief Complaint  Patient presents with   Covid Positive    Denise Cook is a 45 y.o. female.  Presented to ER with concern for COVID symptoms.  Patient reports that symptoms have been ongoing since yesterday, has had cough, congestion, difficulty breathing, body aches.  States pain is all over, no particular place.  Fever.  Has history of asthma, diabetes  Works with [employer].  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, right eye, mild stage 05/14/2015   Sarcoid 12/18/2014   Encounter for long-term (current) use of high-risk medication 12/18/2014   Uveitic glaucoma of both eyes, 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 11/20/2014   Panuveitis, 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     Family History  Adopted: Yes  Problem Relation Age of Onset   Healthy Daughter    Cancer Maternal Grandmother    Alcoholism Mother    Diabetes Paternal Grandmother     Social History   Tobacco Use   Smoking status: Never   Smokeless tobacco: Never  Vaping Use   Vaping Use: Never used  Substance Use Topics   Alcohol use: No   Drug use: No    Home Medications Prior to Admission medications   Medication Sig Start Date End Date Taking? Authorizing Provider  nirmatrelvir/ritonavir EUA (PAXLOVID) 20 x 150 MG & 10 x 100MG TABS Take 3 tablets by mouth 2 (two) times daily for 5 days. Patient GFR is >60. Take nirmatrelvir (150 mg) two tablets twice daily for 5 days and ritonavir (100 mg) one tablet twice daily for 5 days. 03/16/21 03/21/21 Yes Yuna Pizzolato, Ellwood Dense, MD  acetaminophen (TYLENOL) 500 MG tablet Take 1,000 mg by mouth every 6 (six) hours as needed for headache. Reported on 07/06/2015    [provider]  acetaZOLAMIDE ER (DIAMOX) 500 MG capsule Take 1 capsule (500 mg total) by mouth 2 (two) times daily. 03/15/21     acetaZOLAMIDE ER (DIAMOX) 500 MG capsule Take 1 capsule (500 mg total) by mouth 2 (two) times  daily. 09/06/20     albuterol (PROVENTIL HFA;VENTOLIN HFA) 108 (90 Base) MCG/ACT inhaler Inhale 2 puffs into the lungs every 6 (six) hours as needed for wheezing or shortness of breath.    [provider]  albuterol (VENTOLIN HFA) 108 (90 Base) MCG/ACT inhaler Inhale into the lungs.    [provider]  albuterol (VENTOLIN HFA) 108 (90 Base) MCG/ACT inhaler Inhale 1-2 puffs into the lungs every 4 (four) hours as needed for wheezing 03/15/21     ARIPiprazole (ABILIFY) 5 MG tablet Take 1 tablet (5 mg total) by mouth at bedtime. 09/06/20     ARIPiprazole (ABILIFY) 5 MG tablet Take 1 tablet (5 mg total) by mouth at bedtime. 03/15/21     Armodafinil 250 MG tablet  Take 1 tablet (250 mg total) by mouth daily. 09/30/20     atorvastatin (LIPITOR) 10 MG tablet Take 1 tablet (10 mg total) by mouth once a week. 07/08/20     azaTHIOprine (IMURAN) 50 MG tablet TAKE 4 TABLETS (200 MG TOTAL) BY MOUTH ONCE DAILY 04/09/20 04/09/21  Marlana Salvage, Rex, MD  Blood Glucose Monitoring Suppl (FIFTY50 GLUCOSE METER 2.0) w/Device KIT USE TO CHECK BLOOD SUGAR ONCE DAILY. ALTERNATING MORNINGS AND EVENINGS BEFORE MEALS 08/20/17   [provider]  brimonidine (ALPHAGAN) 0.2 % ophthalmic solution Place 1 drop into the left eye 3 (three) times daily. 03/11/21     brimonidine-timolol (COMBIGAN) 0.2-0.5 % ophthalmic solution Apply to eye. 09/20/18   [provider]  budesonide-formoterol (SYMBICORT) 80-4.5 MCG/ACT inhaler Inhale into the lungs.    [provider]  Cholecalciferol (VITAMIN D3) 5000 units CAPS Take 5,000 Units by mouth every morning.     [provider]  DULoxetine (CYMBALTA) 60 MG capsule Take 1 capsule (60 mg total) by mouth daily. 09/06/20     DULoxetine (CYMBALTA) 60 MG capsule Take 1 capsule (60 mg total) by mouth daily. 03/15/21     levocetirizine (XYZAL) 5 MG tablet Take 5 mg by mouth 2 (two) times daily.     [provider]  lisdexamfetamine (VYVANSE) 40 MG capsule Take 1 capsule (40 mg total) by mouth in the morning. 03/15/21     lisdexamfetamine (VYVANSE) 60 MG capsule Take 60 mg by mouth every morning.    [provider]  Magnesium 250 MG TABS Take 250 mg by mouth 2 (two) times daily.     [provider]  metoCLOPramide (REGLAN) 10 MG tablet Take 1 tablet (10 mg total) by mouth every 8 (eight) hours as needed for nausea. 11/26/19   Tasia Catchings, Amy V, PA-C  montelukast (SINGULAIR) 10 MG tablet Take by mouth.    [provider]  montelukast (SINGULAIR) 10 MG tablet Take 1 tablet (10 mg total) by mouth every evening. 03/15/21     ondansetron (ZOFRAN) 4 MG tablet Take 1 tablet (4 mg total) by mouth every 8 (eight) hours  as needed for nausea 03/15/21     ondansetron (ZOFRAN) 8 MG tablet Take 4-8 mg by mouth every 8 (eight) hours as needed. 11/13/18   [provider]  ondansetron (ZOFRAN) 8 MG tablet Take 1/2 to 1 tablet by mouth every 8 hours as needed for nausea 08/10/20   Maury Dus, MD  ONE TOUCH ULTRA TEST test strip USE TO CHECK BLOOD SUGAR ONCE DAILY. ALTERNATING MORNINGS AND EVENINGS BEFORE MEALS 08/20/17   [provider]  oxybutynin (DITROPAN) 5 MG tablet Take 1 tablet (5 mg total) by mouth every 8 (eight) hours as needed for  urinary incontinence 09/06/20     Semaglutide,0.25 or 0.5MG/DOS, (OZEMPIC, 0.25 OR 0.5 MG/DOSE,) 2 MG/1.5ML SOPN Inject 0.5 mg into the skin once a week. 03/03/21     Semaglutide,0.25 or 0.5MG/DOS, (OZEMPIC, 0.25 OR 0.5 MG/DOSE,) 2 MG/1.5ML SOPN titrate 0.25 mg once weekly for 4 weeks then 0.5 mg once weekly for 2 weeks Subcutaneous once a week 03/15/21     tiZANidine (ZANAFLEX) 2 MG tablet Take 1 tablet (2 mg total) by mouth every 8 (eight) hours as needed for muscle spasms. 11/26/19   Tasia Catchings, Amy V, PA-C  Topiramate ER (TROKENDI XR) 200 MG CP24 Take 1 capsule by mouth at bedtime. 02/02/21   Alric Ran, MD    Allergies    Metformin, Other, and Metformin and related  Review of Systems   Review of Systems  Constitutional:  Positive for chills, fatigue and fever.  HENT:  Positive for sore throat. Negative for ear pain.   Eyes:  Negative for pain and visual disturbance.  Respiratory:  Positive for cough and shortness of breath.   Cardiovascular:  Negative for chest pain and palpitations.  Gastrointestinal:  Negative for abdominal pain and vomiting.  Genitourinary:  Negative for dysuria and hematuria.  Musculoskeletal:  Positive for arthralgias and myalgias. Negative for back pain.  Skin:  Negative for color change and rash.  Neurological:  Negative for seizures and syncope.  All other systems reviewed and are negative.  Physical Exam Updated Vital Signs BP 123/75     Pulse (!) 110    Temp (!) 101.3 F (38.5 C)    Resp (!) 22    Ht 5' 1"  (1.549 m)    Wt 126.1 kg    SpO2 99%    BMI 52.53 kg/m   Physical Exam Vitals and nursing note reviewed.  Constitutional:      General: She is not in acute distress.    Appearance: She is well-developed.  HENT:     Head: Normocephalic and atraumatic.  Eyes:     Conjunctiva/sclera: Conjunctivae normal.  Cardiovascular:     Rate and Rhythm: Regular rhythm. Tachycardia present.     Heart sounds: No murmur heard. Pulmonary:     Breath sounds: Normal breath sounds.     Comments: Mild tachypnea but speaking full sentences Abdominal:     Palpations: Abdomen is soft.     Tenderness: There is no abdominal tenderness.  Musculoskeletal:        General: No swelling.     Cervical back: Neck supple.  Skin:    General: Skin is warm and dry.     Capillary Refill: Capillary refill takes less than 2 seconds.  Neurological:     Mental Status: She is alert.  Psychiatric:        Mood and Affect: Mood normal.    ED Results / Procedures / Treatments   Labs (all labs ordered are listed, but only abnormal results are displayed) Labs Reviewed  COMPREHENSIVE METABOLIC PANEL - Abnormal; Notable for the following components:      Result Value   Potassium 3.4 (*)    CO2 21 (*)    Glucose, Bld 103 (*)    Calcium 8.5 (*)    Total Bilirubin 0.2 (*)    All other components within normal limits  CBC WITH DIFFERENTIAL/PLATELET - Abnormal; Notable for the following components:   RBC 3.82 (*)    Hemoglobin 10.9 (*)    HCT 32.6 (*)    RDW 16.3 (*)    All  other components within normal limits  D-DIMER, QUANTITATIVE  TROPONIN I (HIGH SENSITIVITY)  TROPONIN I (HIGH SENSITIVITY)    EKG None  Radiology DG Chest Portable 1 View  Result Date: 03/16/2021 CLINICAL DATA:  Shortness of breath, COVID. EXAM: PORTABLE CHEST 1 VIEW COMPARISON:  None. FINDINGS: The heart size and mediastinal contours are within normal limits. Both  lungs are clear. The visualized skeletal structures are unremarkable. IMPRESSION: No focal consolidation or pleural effusion. Electronically Signed   By: Keane Police D.O.   On: 03/16/2021 14:16    Procedures Procedures   Medications Ordered in ED Medications  ibuprofen (ADVIL) tablet 800 mg (800 mg Oral Given 03/16/21 1253)  acetaminophen (TYLENOL) tablet 650 mg (650 mg Oral Given 03/16/21 1354)  ondansetron (ZOFRAN) injection 4 mg (4 mg Intravenous Given 03/16/21 1359)  sodium chloride 0.9 % bolus 1,000 mL (0 mLs Intravenous Stopped 03/16/21 1458)  sodium chloride 0.9 % bolus 1,000 mL (1,000 mLs Intravenous New Bag/Given 03/16/21 1501)    ED Course  I have reviewed the triage vital signs and the nursing notes.  Pertinent labs & imaging results that were available during my care of the patient were reviewed by me and considered in my medical decision making (see chart for details).    MDM Rules/Calculators/A&P                          45 year old lady presenting to ER with concern for cough, fever, shortness of breath.  Reports positive for COVID-19.  Symptoms for about 1 day.  On arrival here patient was noted to be tachypneic, febrile, tachycardic.  Still speaking in full sentences, not in frank distress.  CXR negative.  D-dimer within normal limits.  EKG without ischemic change and troponin within normal limits, doubt ACS.  Suspect all her symptoms are related to [place].  On reassessment after antipyretic, fluids, symptoms and vital signs have all improved.  Believe she is appropriate for outpatient management currently.  Gave Rx for Paxlovid. Discussed strict return precautions.   After the discussed management above, the patient was determined to be safe for discharge.  The patient was in agreement with this plan and all questions regarding their care were answered.  ED return precautions were discussed and the patient will return to the ED with any significant worsening of  condition.  Smt. Loder was evaluated in Emergency Department on 03/16/2021 for the symptoms described in the history of present illness. She was evaluated in the context of the global COVID-19 pandemic, which necessitated consideration that the patient might be at risk for infection with the SARS-CoV-2 virus that causes COVID-19. Institutional protocols and algorithms that pertain to the evaluation of patients at risk for COVID-19 are in a state of rapid change based on information released by regulatory bodies including the CDC and federal and state organizations. These policies and algorithms were followed during the patient's care in the ED.  Final Clinical Impression(s) / ED Diagnoses Final diagnoses:  COVID-19    Rx / DC Orders ED Discharge Orders          Ordered    nirmatrelvir/ritonavir EUA (PAXLOVID) 20 x 150 MG & 10 x 100MG TABS  2 times daily        03/16/21 1518             Lucrezia Starch, MD 03/16/21 [phone]

## 2021-03-16 NOTE — ED Triage Notes (Signed)
Pt c/o flu like sx started yesterday-reports +covid home test today-DOE-steady gait

## 2021-03-16 NOTE — ED Notes (Signed)
ED Provider at bedside. 

## 2021-03-16 NOTE — Discharge Instructions (Addendum)
Follow CDC guidelines regarding isolation precautions.  Drink plenty of fluids, take Tylenol or Motrin as needed for fever.  If you develop difficulty breathing, chest pain or other new concerning symptom, come back to ER for reassessment.

## 2021-03-20 ENCOUNTER — Telehealth: Payer: 59 | Admitting: Physician Assistant

## 2021-03-20 DIAGNOSIS — U071 COVID-19: Secondary | ICD-10-CM

## 2021-03-20 DIAGNOSIS — R0602 Shortness of breath: Secondary | ICD-10-CM

## 2021-03-20 NOTE — Progress Notes (Signed)
Based on what you shared with me, I feel your condition warrants further evaluation and I recommend that you be seen in a face to face visit. You are high risk for complications from COVID.  Sometimes that includes pneumonia, but there are other more serious complications that need to be evaluated for as well.  Also, given your shortness of breath with exertion (and irritation of your anal fissure) you need a physical exam to determine the best course of action and treatment.  You may see any of the urgent care centers listed below or go back to [place] if you are more comfortable there.    NOTE: There will be NO CHARGE for this eVisit   If you are having a true medical emergency please call 911.      For an urgent face to face visit, [facility] has six urgent care centers for your convenience:     [facility] [place] [facility] [place] Get Driving Directions [phone] [address] [place], [place] [address]    [facility]) Get Driving Directions [phone] [place], [place] [address]  [facility] [place] [facility]) Get Driving Directions [phone] [address] [place],  [place]  [address]  [facility] Get Driving Directions [phone] [address] [place], [place], [place] [address]   [facility] Get Driving Directions  [phone] [address] [place], [place] [address]   [facility] [place] Get Driving Directions [phone] [address] [place], [place] [address]  Your MyChart E-visit questionnaire answers were reviewed by a board certified advanced clinical practitioner to complete your personal care plan based on your specific symptoms.  Thank you for using e-Visits.     Greater than 5 minutes, yet less than 10 minutes of time have been  spent researching, coordinating, and implementing care for this patient today

## 2021-03-22 ENCOUNTER — Encounter: Payer: 59 | Admitting: Gastroenterology

## 2021-03-24 ENCOUNTER — Other Ambulatory Visit (HOSPITAL_COMMUNITY): Payer: Self-pay

## 2021-03-26 ENCOUNTER — Telehealth: Payer: 59 | Admitting: Nurse Practitioner

## 2021-03-26 DIAGNOSIS — B9689 Other specified bacterial agents as the cause of diseases classified elsewhere: Secondary | ICD-10-CM

## 2021-03-26 DIAGNOSIS — J208 Acute bronchitis due to other specified organisms: Secondary | ICD-10-CM | POA: Diagnosis not present

## 2021-03-26 MED ORDER — AZITHROMYCIN 250 MG PO TABS: ORAL_TABLET | ORAL | 0 refills | Status: AC

## 2021-03-26 MED ORDER — PROMETHAZINE-DM 6.25-15 MG/5ML PO SYRP: 5.0000 mL | ORAL_SOLUTION | Freq: Four times a day (QID) | ORAL | 0 refills | Status: DC | PRN

## 2021-03-26 MED ORDER — PREDNISONE 10 MG PO TABS
10.0000 mg | ORAL_TABLET | Freq: Every day | ORAL | 0 refills | Status: AC
Start: 2021-03-26 — End: 2021-04-01

## 2021-03-26 NOTE — Progress Notes (Signed)
We are sorry that you are not feeling well.  Here is how we plan to help!  Based on your presentation I believe you most likely have A cough due to bacteria.  When patients have a fever and a productive cough with a change in color or increased sputum production, we are concerned about bacterial bronchitis.  If left untreated it can progress to pneumonia.  If your symptoms do not improve with your treatment plan it is important that you contact your provider.   I have prescribed Azithromyin 250 mg: two tablets now and then one tablet daily for 4 additonal days    In addition you may use a prescription cough syrup that has been sent.  Prednisone 10 mg daily for 6 days (see taper instructions below)  From your responses in the eVisit questionnaire you describe inflammation in the upper respiratory tract which is causing a significant cough.  This is commonly called Bronchitis and has four common causes:   Allergies Viral Infections Acid Reflux Bacterial Infection Allergies, viruses and acid reflux are treated by controlling symptoms or eliminating the cause. An example might be a cough caused by taking certain blood pressure medications. You stop the cough by changing the medication. Another example might be a cough caused by acid reflux. Controlling the reflux helps control the cough.  USE OF BRONCHODILATOR ("RESCUE") INHALERS: There is a risk from using your bronchodilator too frequently.  The risk is that over-reliance on a medication which only relaxes the muscles surrounding the breathing tubes can reduce the effectiveness of medications prescribed to reduce swelling and congestion of the tubes themselves.  Although you feel brief relief from the bronchodilator inhaler, your asthma may actually be worsening with the tubes becoming more swollen and filled with mucus.  This can delay other crucial treatments, such as oral steroid medications. If you need to use a bronchodilator inhaler daily,  several times per day, you should discuss this with your provider.  There are probably better treatments that could be used to keep your asthma under control.     HOME CARE Only take medications as instructed by your medical team. Complete the entire course of an antibiotic. Drink plenty of fluids and get plenty of rest. Avoid close contacts especially the very young and the elderly Cover your mouth if you cough or cough into your sleeve. Always remember to wash your hands A steam or ultrasonic humidifier can help congestion.   GET HELP RIGHT AWAY IF: You develop worsening fever. You become short of breath You cough up blood. Your symptoms persist after you have completed your treatment plan MAKE SURE YOU  Understand these instructions. Will watch your condition. Will get help right away if you are not doing well or get worse.    Thank you for choosing an e-visit.  Your e-visit answers were reviewed by a board certified advanced clinical practitioner to complete your personal care plan. Depending upon the condition, your plan could have included both over the counter or prescription medications.  Please review your pharmacy choice. Make sure the pharmacy is open so you can pick up prescription now. If there is a problem, you may contact your provider through [employer] and have the prescription routed to another pharmacy.  Your safety is important to [place]. If you have drug allergies check your prescription carefully.   For the next 24 hours you can use MyChart to ask questions about today's visit, request a non-urgent call back, or ask for a  work or school excuse. You will get an email in the next two days asking about your experience. I hope that your e-visit has been valuable and will speed your recovery.

## 2021-03-26 NOTE — Progress Notes (Signed)
I have spent 5 minutes in review of e-visit questionnaire, review and updating patient chart, medical decision making and response to patient.  ° °Phu Record W Denise Marcelli, NP ° °  °

## 2021-04-08 ENCOUNTER — Other Ambulatory Visit (HOSPITAL_COMMUNITY): Payer: Self-pay

## 2021-04-08 MED ORDER — LEVOCETIRIZINE DIHYDROCHLORIDE 5 MG PO TABS
5.0000 mg | ORAL_TABLET | Freq: Every day | ORAL | 3 refills | Status: DC
  Filled 2021-04-08: qty 90, 90d supply, fill #0
  Filled 2021-07-14: qty 90, 90d supply, fill #1
  Filled 2021-10-21: qty 90, 90d supply, fill #2
  Filled 2022-01-13: qty 90, 90d supply, fill #3

## 2021-04-09 ENCOUNTER — Other Ambulatory Visit (HOSPITAL_COMMUNITY): Payer: Self-pay

## 2021-04-11 ENCOUNTER — Other Ambulatory Visit (HOSPITAL_COMMUNITY): Payer: Self-pay

## 2021-04-11 MED ORDER — ARMODAFINIL 250 MG PO TABS
250.0000 mg | ORAL_TABLET | Freq: Every day | ORAL | 1 refills | Status: DC
  Filled 2021-04-11: qty 90, 90d supply, fill #0
  Filled 2021-07-14: qty 90, 90d supply, fill #1

## 2021-04-15 DIAGNOSIS — H40041 Steroid responder, right eye: Secondary | ICD-10-CM | POA: Diagnosis not present

## 2021-04-23 DIAGNOSIS — M7711 Lateral epicondylitis, right elbow: Secondary | ICD-10-CM | POA: Diagnosis not present

## 2021-04-26 ENCOUNTER — Other Ambulatory Visit: Payer: Self-pay | Admitting: Gastroenterology

## 2021-04-26 DIAGNOSIS — M7711 Lateral epicondylitis, right elbow: Secondary | ICD-10-CM | POA: Diagnosis not present

## 2021-05-09 ENCOUNTER — Ambulatory Visit: Payer: 59 | Admitting: Neurology

## 2021-05-09 ENCOUNTER — Other Ambulatory Visit (HOSPITAL_COMMUNITY): Payer: Self-pay

## 2021-05-09 ENCOUNTER — Encounter: Payer: Self-pay | Admitting: Neurology

## 2021-05-09 VITALS — BP 118/83 | HR 86 | Ht 61.0 in | Wt 280.0 lb

## 2021-05-09 DIAGNOSIS — G932 Benign intracranial hypertension: Secondary | ICD-10-CM | POA: Diagnosis not present

## 2021-05-09 DIAGNOSIS — G4733 Obstructive sleep apnea (adult) (pediatric): Secondary | ICD-10-CM

## 2021-05-09 MED ORDER — ACETAZOLAMIDE ER 500 MG PO CP12
500.0000 mg | ORAL_CAPSULE | Freq: Two times a day (BID) | ORAL | 4 refills | Status: DC
  Filled 2021-05-09 – 2021-07-14 (×2): qty 180, 90d supply, fill #0
  Filled 2022-01-13: qty 180, 90d supply, fill #1
  Filled 2022-04-25: qty 180, 90d supply, fill #2

## 2021-05-09 NOTE — Patient Instructions (Signed)
Continue Diamox 500 mg twice daily Continue with Trokendi 200 mg at nighttime Follow-up with primary care doctor for referral to psychologist or therapist regarding your bipolar disorder.   Follow-up in 1 year, with NP Call for any question or concerns.

## 2021-05-09 NOTE — Progress Notes (Signed)
[facility]  PATIENT: Denise Cook DOB: 28-Apr-1975  REFERRING CLINICIAN: Maury Dus, MD HISTORY FROM: Patient  REASON FOR VISIT: Headaches, history of IIH here for follow up    HISTORICAL  CHIEF COMPLAINT:  Chief Complaint  Patient presents with   Follow-up    Rm 12. Alone. Pt continues on diamox 500 mg bid and trokendi 200 mg nightly. Pt reports mild daily headaches.    INTERVAL HISTORY 05/09/2021:  Patient presents today for follow-up, last visit in November plan was to start her Trokendi with her Diamox and to monitor the headaches.  I also advised her to follow-up with her sleep doctor regarding her sleep apnea.  She reports compliance with the topiramate with the Diamox 500 mg twice daily, reported headaches very mild, she does not take any abortive medications.  She is currently satisfied with her current frequency.   She has not followed with her sleep doctors, still has not been using her CPAP machine.  She reported she tried exercising but is difficult for her, will have leg heaviness after 10 to 15 minutes of walking and she had to take a break.  Denies any recent falls.  Patient also reports she is interested in the gastric bypass surgery, she is approved for it but because of her history of bipolar disorder she is unsure if this will be good for her.  She does not have a psychiatrist or therapist that manage her bipolar.     HISTORY OF PRESENT ILLNESS:  This is a 45 year old woman with past medical history of pseudotumor cerebri, sarcoidosis, diabetes mellitus and sleep apnea who is presenting to establish care regarding her diagnosis of pseudotumor cerebri.  Patient stated that he has been diagnosed in 2015, has been prescribed on Diamox, currently she is taking Diamox 500 mg twice daily.  She had multiple lumbar punctures, a total of 6, reported lumbar puncture was abnormal, was told that her pressure was high.  Since diagnosis she continues to  have headaches, but she reported they are not as bad as before, headache described as heaviness and achiness on top of head, they can last all day.  No photophobia or phonophobia, no migrainous feature but there is occasional visual obscuration present.  She also had a history of obesity and sleep apnea, stated that she qualify for weight loss surgery but she is scared of the surgery.  In regard of her sleep apnea she is not compliant with her CPap machine.    Headache History and Characteristics: Onset: 2015 Location: Top of head  Quality:  heaviness and achy  Intensity:4 /10.  Duration: can last a day  Migrainous Features: Photophobia, phonophobia, nausea, vomiting.  Aura: No  History of brain injury or tumor: No  Prior prophylaxis: Propranolol: No  Verapamil:No TCA: No Topamax: No Depakote: No Effexor: No Cymbalta: No Neurontin:No  Prior abortives: Triptan: No Anti-emetic: No Steroids: No Ergotamine suppository: No   OTHER MEDICAL CONDITIONS: cataract uveitis, uveitis, DM, Hypersomnia, CKD, sarcoid    REVIEW OF SYSTEMS: Full 14 system review of systems performed and negative with exception of: As noted in the HPI.  ALLERGIES: Allergies  Allergen Reactions   Metformin    Methotrexate Other (See Comments)   Other Other (See Comments)    PEPPER sriracha turned tongue black    Metformin And Related Itching    HOME MEDICATIONS: Outpatient Medications Prior to Visit  Medication Sig Dispense Refill   acetaminophen (TYLENOL) 500 MG tablet Take 1,000 mg by mouth  every 6 (six) hours as needed for headache. Reported on 07/06/2015     albuterol (VENTOLIN HFA) 108 (90 Base) MCG/ACT inhaler Inhale 1-2 puffs into the lungs every 4 (four) hours as needed for wheezing 18 g 5   ARIPiprazole (ABILIFY) 5 MG tablet Take 1 tablet (5 mg total) by mouth at bedtime. 90 tablet 1   Armodafinil 250 MG tablet Take 1 tablet (250 mg total) by mouth daily. 90 tablet 1   Armodafinil 250 MG  tablet Take 1 tablet (250 mg total) by mouth daily. 90 tablet 1   atorvastatin (LIPITOR) 10 MG tablet Take 1 tablet (10 mg total) by mouth once a week. 13 tablet 3   Blood Glucose Monitoring Suppl (FIFTY50 GLUCOSE METER 2.0) w/Device KIT USE TO CHECK BLOOD SUGAR ONCE DAILY. ALTERNATING MORNINGS AND EVENINGS BEFORE MEALS     brimonidine (ALPHAGAN) 0.2 % ophthalmic solution Place 1 drop into the left eye 3 (three) times daily. 5 mL 12   brimonidine-timolol (COMBIGAN) 0.2-0.5 % ophthalmic solution Apply to eye.     Cholecalciferol (VITAMIN D3) 5000 units CAPS Take 5,000 Units by mouth every morning.      DULoxetine (CYMBALTA) 60 MG capsule Take 1 capsule (60 mg total) by mouth daily. 90 capsule 1   DULoxetine (CYMBALTA) 60 MG capsule Take 1 capsule (60 mg total) by mouth daily. 90 capsule 1   levocetirizine (XYZAL) 5 MG tablet Take 1 tablet (5 mg total) by mouth every morning as needed for allergies. 90 tablet 3   lisdexamfetamine (VYVANSE) 40 MG capsule Take 1 capsule (40 mg total) by mouth in the morning. 90 capsule 0   Magnesium 250 MG TABS Take 250 mg by mouth 2 (two) times daily.      metoCLOPramide (REGLAN) 10 MG tablet Take 1 tablet (10 mg total) by mouth every 8 (eight) hours as needed for nausea. 15 tablet 0   montelukast (SINGULAIR) 10 MG tablet Take 1 tablet (10 mg total) by mouth every evening. 90 tablet 1   ondansetron (ZOFRAN) 4 MG tablet Take 1 tablet (4 mg total) by mouth every 8 (eight) hours as needed for nausea 30 tablet 2   ondansetron (ZOFRAN) 8 MG tablet Take 1/2 to 1 tablet by mouth every 8 hours as needed for nausea 20 tablet 0   ONE TOUCH ULTRA TEST test strip USE TO CHECK BLOOD SUGAR ONCE DAILY. ALTERNATING MORNINGS AND EVENINGS BEFORE MEALS  3   oxybutynin (DITROPAN) 5 MG tablet Take 1 tablet (5 mg total) by mouth every 8 (eight) hours as needed for urinary incontinence 270 tablet 1   promethazine-dextromethorphan (PROMETHAZINE-DM) 6.25-15 MG/5ML syrup Take 5 mLs by mouth 4  (four) times daily as needed for cough. 240 mL 0   Semaglutide,0.25 or 0.5MG/DOS, (OZEMPIC, 0.25 OR 0.5 MG/DOSE,) 2 MG/1.5ML SOPN Inject into the skin once weekly. Titrate 0.25 mg once weekly for 4 weeks then 0.5 mg once weekly for 2 weeks. 4.5 mL 1   tiZANidine (ZANAFLEX) 2 MG tablet Take 1 tablet (2 mg total) by mouth every 8 (eight) hours as needed for muscle spasms. 15 tablet 0   Topiramate ER (TROKENDI XR) 200 MG CP24 Take 1 capsule by mouth at bedtime. 90 capsule 3   acetaZOLAMIDE ER (DIAMOX) 500 MG capsule Take 1 capsule (500 mg total) by mouth 2 (two) times daily. 180 capsule 1   acetaZOLAMIDE ER (DIAMOX) 500 MG capsule Take 1 capsule (500 mg total) by mouth 2 (two) times daily. 180 capsule 1  albuterol (PROVENTIL HFA;VENTOLIN HFA) 108 (90 Base) MCG/ACT inhaler Inhale 2 puffs into the lungs every 6 (six) hours as needed for wheezing or shortness of breath.     albuterol (VENTOLIN HFA) 108 (90 Base) MCG/ACT inhaler Inhale into the lungs.     ARIPiprazole (ABILIFY) 5 MG tablet Take 1 tablet (5 mg total) by mouth at bedtime. 90 tablet 1   budesonide-formoterol (SYMBICORT) 80-4.5 MCG/ACT inhaler Inhale into the lungs.     levocetirizine (XYZAL) 5 MG tablet Take 5 mg by mouth 2 (two) times daily.      lisdexamfetamine (VYVANSE) 60 MG capsule Take 60 mg by mouth every morning.     montelukast (SINGULAIR) 10 MG tablet Take by mouth.     ondansetron (ZOFRAN) 8 MG tablet Take 4-8 mg by mouth every 8 (eight) hours as needed.     Semaglutide,0.25 or 0.5MG/DOS, (OZEMPIC, 0.25 OR 0.5 MG/DOSE,) 2 MG/1.5ML SOPN Inject 0.5 mg into the skin once a week. 4.5 mL 2   No facility-administered medications prior to visit.    PAST MEDICAL HISTORY: Past Medical History:  Diagnosis Date   Allergy    Asthma    Depression    Diabetes mellitus    diet control   Glaucoma    Obesity    Pseudotumor cerebri    Sarcoidosis 2012    PAST SURGICAL HISTORY: Past Surgical History:  Procedure Laterality Date    ABDOMINAL HYSTERECTOMY     CARDIAC CATHETERIZATION     CHOLECYSTECTOMY     EYE SURGERY Left    Removed vitreous membrane   glaucoma shunt     INCISION AND DRAINAGE Right 05/01/2014   Procedure: INCISION AND DRAINAGE RIGHT INDEX FLEXOR SHEATH  ;  Surgeon: Charlotte Crumb, MD;  Location: [facility];  Service: Orthopedics;  Laterality: Right;   left eye surgery      FAMILY HISTORY: Family History  Adopted: Yes  Problem Relation Age of Onset   Healthy Daughter    Cancer Maternal Grandmother    Alcoholism Mother    Diabetes Paternal Grandmother     SOCIAL HISTORY: Social History   Socioeconomic History   Marital status: Single    Spouse name: Not on file   Number of children: 1   Years of education: Not on file   Highest education level: Not on file  Occupational History   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   She lives alone.  She has one grown daughter (83).   Social Determinants of Health   Financial Resource Strain: Not on file  Food Insecurity: Not on file  Transportation Needs: Not on file  Physical Activity: Not on file  Stress: Not on file  Social Connections: Not on file  Intimate Partner Violence: Not on file     PHYSICAL EXAM   GENERAL EXAM/CONSTITUTIONAL: Vitals:  Vitals:   05/09/21 0912  BP: 118/83  Pulse: 86  Weight: 280 lb (127 kg)  Height: 5' 1"  (1.549 m)    Body  mass index is 52.91 kg/m. Wt Readings from Last 3 Encounters:  05/09/21 280 lb (127 kg)  03/16/21 278 lb (126.1 kg)   02/02/21 277 lb 8 oz (125.9 kg)   Patient is in no distress; well developed, nourished and groomed; neck is supple. Obese patient   EYES: Pupils round and reactive to light, Visual fields full to confrontation, Extraocular movements intacts,   MUSCULOSKELETAL: Gait, strength, tone, movements noted in Neurologic exam below  NEUROLOGIC: MENTAL STATUS:  No flowsheet data found. awake, alert, oriented to person, place and time recent and remote memory intact normal attention and concentration language fluent, comprehension intact, naming intact fund of knowledge appropriate  CRANIAL NERVE:  2nd, 3rd, 4th, 6th - pupils equal and reactive to light, visual fields full to confrontation, extraocular muscles intact, no nystagmus 5th - facial sensation symmetric 7th - facial strength symmetric 8th - hearing intact 9th - palate elevates symmetrically, uvula midline 11th - shoulder shrug symmetric 12th - tongue protrusion midline  MOTOR:  normal bulk and tone, full strength in the BUE, BLE  SENSORY:  normal and symmetric to light touch, pinprick, temperature, vibration  COORDINATION:  finger-nose-finger, fine finger movements normal  REFLEXES:  deep tendon reflexes present and symmetric  GAIT/STATION:  normal   DIAGNOSTIC DATA (LABS, IMAGING, TESTING) - I reviewed patient records, labs, notes, testing and imaging myself where available.  Lab Results  Component Value Date   WBC 7.6 03/16/2021   HGB 10.9 (L) 03/16/2021   HCT 32.6 (L) 03/16/2021   MCV 85.3 03/16/2021   PLT 346 03/16/2021      Component Value Date/Time   NA 136 03/16/2021 1353   K 3.4 (L) 03/16/2021 1353   CL 106 03/16/2021 1353   CO2 21 (L) 03/16/2021 1353   GLUCOSE 103 (H) 03/16/2021 1353   BUN 7 03/16/2021 1353   CREATININE 0.94 03/16/2021 1353   CREATININE 1.03 07/16/2017 1126   CALCIUM 8.5 (L) 03/16/2021 1353   PROT 7.3 03/16/2021 1353   ALBUMIN 3.6 03/16/2021 1353   AST 24 03/16/2021 1353    ALT 16 03/16/2021 1353   ALKPHOS 74 03/16/2021 1353   BILITOT 0.2 (L) 03/16/2021 1353   GFRNONAA >60 03/16/2021 1353   GFRNONAA 76 05/26/2013 1840   GFRAA >60 02/11/2018 2052   GFRAA 88 05/26/2013 1840   Lab Results  Component Value Date   CHOL  07/05/2009    124        ATP III CLASSIFICATION:  <200     mg/dL   Desirable  200-239  mg/dL   Borderline High  >=240    mg/dL   High          HDL 37 (L) 07/05/2009   LDLCALC  07/05/2009    61        Total Cholesterol/HDL:CHD Risk Coronary Heart Disease Risk Table                     Men   Women  1/2 Average Risk   3.4   3.3  Average Risk       5.0   4.4  2 X Average Risk   9.6   7.1  3 X Average Risk  23.4   11.0        Use the calculated Patient Ratio above and the CHD Risk Table to determine the patient's CHD Risk.        ATP III CLASSIFICATION (LDL):  <100     mg/dL  Optimal  100-129  mg/dL   Near or Above                    Optimal  130-159  mg/dL   Borderline  160-189  mg/dL   High  >190     mg/dL   Very High   TRIG 130 07/05/2009   CHOLHDL 3.4 07/05/2009   Lab Results  Component Value Date   HGBA1C (H) 07/05/2009    6.7 (NOTE) The ADA recommends the following therapeutic goal for glycemic control related to Hgb A1c measurement: Goal of therapy: <6.5 Hgb A1c  Reference: American Diabetes Association: Clinical Practice Recommendations 2010, Diabetes Care, 2010, 33: (Suppl  1).   Lab Results  Component Value Date   FUXNATFT73 220 07/16/2017   Lab Results  Component Value Date   TSH  07/05/2009    1.103 (NOTE) Please note change in reference ranges for ages 52W to 65Y. Test methodology is 3rd generation TSH    Head CT 2022: No acute intracranial abnormalities. Empty Sella   MRI Brain 2019 1.  No acute intracranial abnormality. 2. No abnormal intracranial enhancement or dural thickening to suggest neurosarcoidosis. Mild nonspecific subcortical cerebral white matter signal changes may have progressed since  2015. 3. Chronic partially empty sella and prominence of the optic nerve root sleeves again compatible with idiopathic intracranial hypertension (pseudotumor cerebri). 4. Mild bilateral maxillary sinus inflammation with small fluid levels is new since 2018.   I personally review the Brain Images.     ASSESSMENT AND PLAN  46 y.o. year old female with past medical history of idiopathic intracranial hypertension, obesity, diabetes mellitus, sarcoidosis, bipolar disorder and sleep apnea who is presenting for follow-up for her idiopathic intracranial hypertension.  She is currently on Diamox 500 mg twice daily and Trokendi 200 mg at nighttime.  She reports improvement of her headaches, she still get them but they are very mild, she does not take any abortive medications.  She reports satisfaction with her current frequency.   In terms of her sleep apnea, she has not follow-up with her sleep doctors, currently does not does not use her CPAP machine.  I spent a lot of time discussing with patient importance of compliance with the CPAP machine, that some of the headache that she is having may be actually related to sleep apnea and not to IIH.  I did advise her to follow-up with her doctors, try to get a better mask.   In terms of her weight, she reports exercising, walking but will usually have leg heaviness after 10 to 15-minutes and she has to take a break.  I encourage patient to continue exercise even though she is having heaviness, I have explained to her that this is most likely signs of deconditioning.  She reported being approved for weight loss surgery but she is little hesitant due to her history of bipolar disorder.  Her bipolar disorder has been managed by her primary care doctor, I did advise her to follow-up with primary care doctor to obtain a referral to a therapist or psychologist to discuss about her disease and also to discuss about the weight loss surgery.  She will follow-up in 1 year with  the nurse practitioner, likely Butler Denmark, I have provided her 1 year supply of medications.  She also knows to contact me if she has any questions or concerns.    1. Idiopathic intracranial hypertension   2. Morbid obesity ([place])   3. Obstructive sleep  apnea syndrome      Patient Instructions  Continue Diamox 500 mg twice daily Continue with Trokendi 200 mg at nighttime Follow-up with primary care doctor for referral to psychologist or therapist regarding your bipolar disorder.   Follow-up in 1 year, with NP Call for any question or concerns.     No orders of the defined types were placed in this encounter.    Meds ordered this encounter  Medications   acetaZOLAMIDE ER (DIAMOX) 500 MG capsule    Sig: Take 1 capsule (500 mg total) by mouth 2 (two) times daily.    Dispense:  180 capsule    Refill:  4    Return in about 1 year (around 05/09/2022).    Alric Ran, MD 05/09/2021, 9:39 AM  [facility] [address] [place], [place] [address] [phone]

## 2021-05-12 DIAGNOSIS — M25521 Pain in right elbow: Secondary | ICD-10-CM | POA: Diagnosis not present

## 2021-05-12 DIAGNOSIS — M7711 Lateral epicondylitis, right elbow: Secondary | ICD-10-CM | POA: Diagnosis not present

## 2021-05-26 DIAGNOSIS — M25521 Pain in right elbow: Secondary | ICD-10-CM | POA: Diagnosis not present

## 2021-05-26 DIAGNOSIS — M7711 Lateral epicondylitis, right elbow: Secondary | ICD-10-CM | POA: Diagnosis not present

## 2021-05-31 DIAGNOSIS — M7711 Lateral epicondylitis, right elbow: Secondary | ICD-10-CM | POA: Diagnosis not present

## 2021-05-31 DIAGNOSIS — M25521 Pain in right elbow: Secondary | ICD-10-CM | POA: Diagnosis not present

## 2021-06-10 DIAGNOSIS — M25521 Pain in right elbow: Secondary | ICD-10-CM | POA: Diagnosis not present

## 2021-06-10 DIAGNOSIS — M7711 Lateral epicondylitis, right elbow: Secondary | ICD-10-CM | POA: Diagnosis not present

## 2021-06-13 ENCOUNTER — Encounter (HOSPITAL_COMMUNITY): Payer: Self-pay | Admitting: Gastroenterology

## 2021-06-13 NOTE — Progress Notes (Signed)
Attempted to obtain medical history via telephone, unable to reach at this time.Due to invalid number listed I was unable to leave a voicemail to return pre surgical testing department's phone call.  ?

## 2021-06-14 ENCOUNTER — Other Ambulatory Visit (HOSPITAL_COMMUNITY): Payer: Self-pay

## 2021-06-14 DIAGNOSIS — M25521 Pain in right elbow: Secondary | ICD-10-CM | POA: Diagnosis not present

## 2021-06-14 DIAGNOSIS — M7711 Lateral epicondylitis, right elbow: Secondary | ICD-10-CM | POA: Diagnosis not present

## 2021-06-14 MED ORDER — PEG 3350-KCL-NA BICARB-NACL 420 G PO SOLR
ORAL | 0 refills | Status: DC
  Filled 2021-06-14: qty 4000, 1d supply, fill #0

## 2021-06-16 ENCOUNTER — Other Ambulatory Visit (HOSPITAL_COMMUNITY): Payer: Self-pay

## 2021-06-17 ENCOUNTER — Other Ambulatory Visit (HOSPITAL_COMMUNITY): Payer: Self-pay

## 2021-06-20 NOTE — H&P (Signed)
45/female for a screening colonoscopy. ? ?ROS:  ?GI PROCEDURE:  ?Pacemaker/ AICD no. Artificial heart valves no. MI/heart attack no. Abnormal heart rhythm no. Angina no. CVA no. Hypertension no. Hypotension no. Asthma, COPD YES- asthma. Sleep apnea YES, using CPAP sometimes. Seizure disorders no. Artificial joints no. Severe DJD no. Diabetes YES, type II. Significant headaches no. Vertigo YES. Depression/anxiety YES. Abnormal bleeding no. Kidney Disease YES-CKD stage 3. Liver disease YES- fatty liver. Chance of pregnancy no. Blood transfusion no.  ?  ?  ? ? 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, on Imuran - rheumatologist at [facility], PPD positive s/p INH and Rifampin, Type 2 diabetes mellitus without complications, Proteinuria, Migraine headache, Low back pain, Cough variant asthma, Anxiety, Pseudotumor cerebri, Sarcoidosis, Gastritis, unspecified, with bleeding, Fatty liver disease, nonalcoholic, Inflammatory polyarthropathy, Overweight, Muscle tension headache, Asthma, Other long term (current) drug therapy, Panuveitis, both eyes, Binge eating disorder, Chronic pruritus, Meralgia paresthetica of right side, Vitamin D deficiency, Right upper extremity numbness, Allergic rhinitis, BMI 45.0-49.9, adult, PSG 05/2018: AHI 6.7/hr; REM AHI 20.5/hr; [id number]%)-REM dominant OSA; MSLT- (+) IH- SOL -46 seconds, no SOREMPs, 5/5 naps, BPD-II- Dx 2020, APSD: usign UV light therapy, OSA- uses CPAP sometimes. ?  ?   ? ? Surgical History: hysterectomy for treatment of endometriosis , gall bladder removed , right index finger pulley release 04/2014, eye surgery- bilateral (L) eye exploratory, (R) eye aqueous shunt , wisdom teeth removed , Aqueous shunt (R) eye .   ?   ? ? Hospitalization/Major Diagnostic Procedure: bronchitis , childbirth , Not in the past year 04/2021.   ?   ? ? Family History: Father: alive, Adopted father- living . Mother: alive, Adopted mother, Biological  mother- deceased, alcoholic issues , diagnosed with Diabetes. Paternal GHigh AmanaFather: alive. Paternal Grand Mother: alive. Maternal Grand Father: alive. Maternal Grand Mother: deceased, Stomach cancer.  ?Patient was adopted ?Family history is unknown to the patient other than one grandmother that died of stomach cancer, and another grandparent with diabetes.  ?   ? ? Social History:  ?General:  ?Tobacco use  ?cigarettes: Never smoked ?Tobacco history last updated 04/26/2021 ?Vaping No ?Alcohol: yes, Rare, wine.  ?Caffeine: 2 servings daily hot cocoa, tea, occasionally.  ?no Recreational drug use, never.  ?no Exercise.  ?Marital Status: single.  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  ?Religion: Christian.  ?Seat belt use: yes.   ?   ? ? Medications: Taking Armodafinil 250 MG Tablet 1 tablet Orally Once a day, Notes: 6am, Taking Levocetirizine Dihydrochloride 5 MG Tablet 1 tablet by mouth every morning as needed for allergies, Taking Aleve(Naproxen) 220 MG Tablet 2 tablets Orally every 12 hrs, Notes: as needed, Taking ARIPiprazole 5 MG Tablet 1 tablet by mouth at bedtime, Taking Cymbalta(DULoxetine HCl) 60 MG Capsule Delayed Release Particles 1 capsule by mouth Once a day, Taking Ozempic 0.25 or 0.5 MG/DOSE Solution Pen-injector titrate 0.25 mg once weekly for 4 weeks then 0.5 mg once weekly for 2 weeks Subcutaneous once a week, Taking Diamox Sequels(acetaZOLAMIDE ER) 500 MG Capsule Extended Release 12 Hour 1 capsule Orally twice a day, Taking Trokendi XR(Topiramate ER) 200 MG Capsule Extended Release 24 Hour 1 capsule Orally Once a day, Taking Vyvanse(Lisdexamfetamine Dimesylate) 40 MG Capsule 1 capsule by mouth every morning - Please fill 30 days after the previous Vyvanse fill, Taking Montelukast Sodium 10 MG Tablet 1 tablet by mouth every evening, Taking  Albuterol Sulfate HFA 108 (90 Base) MCG/ACT Aerosol  Solution 1 puff as needed Inhalation every 4 hrs, Notes: as needed, Taking ProAir HFA(Albuterol Sulfate HFA) 108 (90 Base) MCG/ACT Aerosol Solution 1-2 puffs Inhalation every 4 hrs, as needed for wheezing, Taking Ondansetron HCl 4 MG Tablet 1 tablet by mouth every 8 hrs, as needed for nausea, Taking Oxybutynin Chloride 5 MG Tablet 1 tablet by mouth every 8 hrs, as needed for urinary incontinence, Taking Pred Forte(prednisoLONE Acetate) 1 % Suspension 1 drop into affected eye Ophthalmic Twice a day, Taking Cosopt(Dorzolamide HCl-Timolol Mal) 22.3-6.8 MG/ML Solution 1 drop into affected eye Ophthalmic Twice a day, Taking Atorvastatin Calcium 10 MG Tablet 1 tablet by mouth once weekly, Taking Imuran(azaTHIOprine) 50 MG Tablet 4 capsules Orally daily, Taking Tylenol(APAP) 325 MG Tablet 2 tablets Orally every 6 hrs, as needed for pain, Taking Magnesium 250 MG Tablet 1 tablet with a meal Orally twice a day, Taking Vitamin D3 Maximum Strength(Cholecalciferol) 5000 UNIT Capsule 1 capsule Orally Once a day, Not-Taking OneTouch Verio Reflect w/Device Kit as directed , Not-Taking OneTouch Verio(Blood Glucose Test) - Strip for use when checking blood sugars In Vitro- DX: E11.65 once daily, alternating mornings and evenings before meals, Not-Taking OneTouch Delica Plus [id number] - Miscellaneous for use when checking blood sugars in vitro-DX E11.65 once daily, alternating mornings and evenings before meals, Not-Taking Vyvanse(Lisdexamfetamine Dimesylate) 40 MG Capsule 1 capsule by mouth every morning - Please fill 30 days after last fill, Not-Taking Vyvanse(Lisdexamfetamine Dimesylate) 40 MG Capsule 1 capsule by mouth every morning - Please fill 30 days after the previous Vyvanse fill, Not-Taking Flonase(Fluticasone Propionate) 50 MCG/ACT Suspension 2 sprays Nasally at bedtime, Medication List reviewed and reconciled with the patient  ?   ? ? Allergies: Methotrexate: ?intolerance, metformin: hives.   ?   ? ?Objective: ?  ? ?  Vitals: Wt 271.3, Wt change -4.7 lb, Ht 61.5, BMI 50.43 ?01/24- Pt weighed in office.  ?   ? ? ?Assessment: ?  ?Screen for colon cancer ?Obese ? ?Plan: ?Colonoscopy at [facility] ?

## 2021-06-20 NOTE — Anesthesia Preprocedure Evaluation (Addendum)
Anesthesia Evaluation  ?Patient identified by MRN, date of birth, ID band ?Patient awake ? ? ? ?Reviewed: ?Allergy & Precautions, NPO status , Patient's Chart, lab work & pertinent test results ? ?Airway ?Mallampati: III ? ?TM Distance: >3 FB ?Neck ROM: Full ? ? ? Dental ?no notable dental hx. ?(+) Teeth Intact, Dental Advisory Given ?  ?Pulmonary ?asthma , sleep apnea and Continuous Positive Airway Pressure Ventilation ,  ?Hx of Sarcoidosis ?  ?Pulmonary exam normal ?breath sounds clear to auscultation ? ? ? ? ? ? Cardiovascular ?Exercise Tolerance: Good ?Normal cardiovascular exam ?Rhythm:Regular Rate:Normal ? ? ?  ?Neuro/Psych ?PSYCHIATRIC DISORDERS Depression   ? GI/Hepatic ?  ?Endo/Other  ?diabetes, Type 2Morbid obesity (BMI 50.4) ? Renal/GU ?Renal disease  ? ?  ?Musculoskeletal ?negative musculoskeletal ROS ?(+)  ? Abdominal ?(+) + obese,   ?Peds ? Hematology ?  ?Anesthesia Other Findings ?All: to methotrexate and metformin ? Reproductive/Obstetrics ? ?  ? ? ? ? ? ? ? ? ? ? ? ? ? ?  ?  ? ? ? ? ? ? ? ?Anesthesia Physical ?Anesthesia Plan ? ?ASA: 4 ? ?Anesthesia Plan: MAC  ? ?Post-op Pain Management:   ? ?Induction:  ? ?PONV Risk Score and Plan: Treatment may vary due to age or medical condition ? ?Airway Management Planned: Natural Airway and Nasal Cannula ? ?Additional Equipment: None ? ?Intra-op Plan:  ? ?Post-operative Plan:  ? ?Informed Consent: I have reviewed the patients History and Physical, chart, labs and discussed the procedure including the risks, benefits and alternatives for the proposed anesthesia with the patient or authorized representative who has indicated his/her understanding and acceptance.  ? ? ? ?Dental advisory given ? ?Plan Discussed with: CRNA and Anesthesiologist ? ?Anesthesia Plan Comments: (Screening colonoscopy under MAC)  ? ? ? ? ? ?Anesthesia Quick Evaluation ? ?

## 2021-06-21 ENCOUNTER — Other Ambulatory Visit: Payer: Self-pay

## 2021-06-21 ENCOUNTER — Ambulatory Visit (HOSPITAL_BASED_OUTPATIENT_CLINIC_OR_DEPARTMENT_OTHER): Payer: 59 | Admitting: Anesthesiology

## 2021-06-21 ENCOUNTER — Encounter (HOSPITAL_COMMUNITY): Payer: Self-pay | Admitting: Gastroenterology

## 2021-06-21 ENCOUNTER — Encounter (HOSPITAL_COMMUNITY)
Admission: RE | Disposition: A | Discharge status: Home / Self Care | Payer: Self-pay | Source: Ambulatory Visit | Attending: Gastroenterology

## 2021-06-21 ENCOUNTER — Ambulatory Visit (HOSPITAL_COMMUNITY)
Admission: RE | Admit: 2021-06-21 | Discharge: 2021-06-21 | Disposition: A | Discharge status: Home / Self Care | Payer: 59 | Source: Ambulatory Visit | Attending: Gastroenterology | Admitting: Gastroenterology

## 2021-06-21 ENCOUNTER — Ambulatory Visit (HOSPITAL_COMMUNITY): Payer: 59 | Admitting: Anesthesiology

## 2021-06-21 DIAGNOSIS — Z1211 Encounter for screening for malignant neoplasm of colon: Secondary | ICD-10-CM

## 2021-06-21 DIAGNOSIS — Z6841 Body Mass Index (BMI) 40.0 and over, adult: Secondary | ICD-10-CM | POA: Insufficient documentation

## 2021-06-21 DIAGNOSIS — E119 Type 2 diabetes mellitus without complications: Secondary | ICD-10-CM

## 2021-06-21 DIAGNOSIS — K635 Polyp of colon: Secondary | ICD-10-CM | POA: Insufficient documentation

## 2021-06-21 DIAGNOSIS — F32A Depression, unspecified: Secondary | ICD-10-CM | POA: Diagnosis not present

## 2021-06-21 DIAGNOSIS — Z139 Encounter for screening, unspecified: Secondary | ICD-10-CM | POA: Diagnosis not present

## 2021-06-21 DIAGNOSIS — J45909 Unspecified asthma, uncomplicated: Secondary | ICD-10-CM | POA: Diagnosis not present

## 2021-06-21 DIAGNOSIS — K648 Other hemorrhoids: Secondary | ICD-10-CM

## 2021-06-21 DIAGNOSIS — Z9989 Dependence on other enabling machines and devices: Secondary | ICD-10-CM | POA: Diagnosis not present

## 2021-06-21 DIAGNOSIS — G4733 Obstructive sleep apnea (adult) (pediatric): Secondary | ICD-10-CM | POA: Insufficient documentation

## 2021-06-21 HISTORY — PX: BIOPSY: SHX5522

## 2021-06-21 HISTORY — PX: COLONOSCOPY WITH PROPOFOL: SHX5780

## 2021-06-21 LAB — GLUCOSE, CAPILLARY: Glucose-Capillary: 124 mg/dL — ABNORMAL HIGH (ref 70–99)

## 2021-06-21 SURGERY — COLONOSCOPY WITH PROPOFOL
Anesthesia: Monitor Anesthesia Care

## 2021-06-21 MED ORDER — PROPOFOL 10 MG/ML IV BOLUS
INTRAVENOUS | Status: AC
  Filled 2021-06-21: qty 20

## 2021-06-21 MED ORDER — PROPOFOL 1000 MG/100ML IV EMUL
INTRAVENOUS | Status: AC
  Filled 2021-06-21: qty 100

## 2021-06-21 MED ORDER — PROPOFOL 500 MG/50ML IV EMUL
INTRAVENOUS | Status: DC | PRN
  Administered 2021-06-21: 150 ug/kg/min via INTRAVENOUS

## 2021-06-21 MED ORDER — LACTATED RINGERS IV SOLN: INTRAVENOUS | Status: DC | PRN

## 2021-06-21 MED ORDER — LIDOCAINE 2% (20 MG/ML) 5 ML SYRINGE
INTRAMUSCULAR | Status: DC | PRN
  Administered 2021-06-21: 100 mg via INTRAVENOUS

## 2021-06-21 MED ORDER — PROPOFOL 10 MG/ML IV BOLUS
INTRAVENOUS | Status: DC | PRN
  Administered 2021-06-21: 80 mg via INTRAVENOUS

## 2021-06-21 MED ORDER — PROPOFOL 500 MG/50ML IV EMUL
INTRAVENOUS | Status: AC
Start: 2021-06-21 — End: ?
  Filled 2021-06-21: qty 50

## 2021-06-21 SURGICAL SUPPLY — 22 items

## 2021-06-21 NOTE — Op Note (Signed)
[facility] ?Patient Name: Denise Cook ?Procedure Date: 06/21/2021 ?MRN: [id number] ?Attending MD: Ronnette Juniper , MD ?Date of Birth: 03-May-1975 ?CSN: [id number] ?Age: 46 ?Admit Type: Outpatient ?Procedure:                Colonoscopy ?Indications:              Screening for colorectal malignant neoplasm, This  ?                          is the patient's first colonoscopy ?Providers:                Ronnette Juniper, MD, Ladoris Gene, RN, Elberta Fortis  ?                          Johnella Moloney, Technician ?Referring MD:             Herbie Baltimore Reade,MD ?Medicines:                Monitored Anesthesia Care ?Complications:            No immediate complications. Estimated blood loss:  ?                          Minimal. ?Estimated Blood Loss:     Estimated blood loss was minimal. ?Procedure:                Pre-Anesthesia Assessment: ?                          - Prior to the procedure, a History and Physical  ?                          was performed, and patient medications and  ?                          allergies were reviewed. The patient's tolerance of  ?                          previous anesthesia was also reviewed. The risks  ?                          and benefits of the procedure and the sedation  ?                          options and risks were discussed with the patient.  ?                          All questions were answered, and informed consent  ?                          was obtained. Prior Anticoagulants: The patient has  ?                          taken no previous anticoagulant or antiplatelet  ?                          agents. ASA Grade Assessment:  IV - A patient with  ?                          severe systemic disease that is a constant threat  ?                          to life. After reviewing the risks and benefits,  ?                          the patient was deemed in satisfactory condition to  ?                          undergo the procedure. ?                          After obtaining informed  consent, the colonoscope  ?                          was passed under direct vision. Throughout the  ?                          procedure, the patient's blood pressure, pulse, and  ?                          oxygen saturations were monitored continuously. The  ?                          CF-HQ190L ([id number]) Olympus colonoscope was  ?                          introduced through the anus and advanced to the the  ?                          terminal ileum. The colonoscopy was performed  ?                          without difficulty. The patient tolerated the  ?                          procedure well. The quality of the bowel  ?                          preparation was good. ?Scope In: 8:30:26 AM ?Scope Out: 3:47:42 AM ?Scope Withdrawal Time: 0 hours 10 minutes 12 seconds  ?Total Procedure Duration: 0 hours 15 minutes 50 seconds  ?Findings: ?     The perianal and digital rectal examinations were normal. ?     The terminal ileum appeared normal. ?     Four sessile polyps were found in the sigmoid colon. The polyps were 3  ?     to 5 mm in size. These polyps were removed with a piecemeal technique  ?     using a cold biopsy forceps. Resection and retrieval were complete. ?     Non-bleeding internal hemorrhoids were found during retroflexion. The  ?     hemorrhoids were small. ?  The exam was otherwise without abnormality. ?Impression:               - The examined portion of the ileum was normal. ?                          - Four 3 to 5 mm polyps in the sigmoid colon,  ?                          removed piecemeal using a cold biopsy forceps.  ?                          Resected and retrieved. ?                          - Non-bleeding internal hemorrhoids. ?                          - The examination was otherwise normal. ?Moderate Sedation: ?     Patient did not receive moderate sedation for this procedure, but  ?     instead received monitored anesthesia care. ?Recommendation:           - Patient has a contact number  available for  ?                          emergencies. The signs and symptoms of potential  ?                          delayed complications were discussed with the  ?                          patient. Return to normal activities tomorrow.  ?                          Written discharge instructions were provided to the  ?                          patient. ?                          - Resume regular diet. ?                          - Continue present medications. ?                          - Await pathology results. ?                          - Repeat colonoscopy for surveillance based on  ?                          pathology results. ?Procedure Code(s):        --- Professional --- ?                          [phone], Colonoscopy, flexible; with biopsy, single  ?  or multiple ?Diagnosis Code(s):        --- Professional --- ?                          Z12.11, Encounter for screening for malignant  ?                          neoplasm of colon ?                          K64.8, Other hemorrhoids ?                          K63.5, Polyp of colon ?CPT copyright 2019 [employer] Medical [employer]. All rights reserved. ?The codes documented in this report are preliminary and upon coder review may  ?be revised to meet current compliance requirements. ?Ronnette Juniper, MD ?06/21/2021 8:50:51 AM ?This report has been signed electronically. ?Number of Addenda: 0 ?

## 2021-06-21 NOTE — Discharge Instructions (Signed)

## 2021-06-21 NOTE — Interval H&P Note (Signed)
History and Physical Interval Note: ? ?06/21/2021 ?7:36 AM ? ?Denise Cook  has presented today for colonoscopy, with the diagnosis of Screening.  The various methods of treatment have been discussed with the patient and family. After consideration of risks, benefits and other options for treatment, the patient has consented to  Procedure(s): ?COLONOSCOPY WITH PROPOFOL (N/A) as a surgical intervention.  The patient's history has been reviewed, patient examined, no change in status, stable for surgery.  I have reviewed the patient's chart and labs.  Questions were answered to the patient's satisfaction.   ? ? ?Ronnette Juniper ? ? ?

## 2021-06-21 NOTE — Transfer of Care (Signed)
Immediate Anesthesia Transfer of Care Note ? ?Patient: Denise Cook ? ?Procedure(s) Performed: COLONOSCOPY WITH PROPOFOL ?BIOPSY ? ?Patient Location: Endoscopy Unit ? ?Anesthesia Type:MAC ? ?Level of Consciousness: awake, alert , oriented and patient cooperative ? ?Airway & Oxygen Therapy: Patient Spontanous Breathing and Patient connected to face mask oxygen ? ?Post-op Assessment: Report given to RN, Post -op Vital signs reviewed and stable and Patient moving all extremities ? ?Post vital signs: Reviewed and stable ? ?Last Vitals:  ?Vitals Value Taken Time  ?BP 104/68 06/21/21 0851  ?Temp    ?Pulse    ?Resp 26 06/21/21 0852  ?SpO2    ?Vitals shown include unvalidated device data. ? ?Last Pain:  ?Vitals:  ? 06/21/21 0732  ?TempSrc: Oral  ?PainSc: 0-No pain  ?   ? ?  ? ?Complications: No notable events documented. ?

## 2021-06-21 NOTE — Anesthesia Postprocedure Evaluation (Signed)
Anesthesia Post Note ? ?Patient: Denise Cook ? ?Procedure(s) Performed: COLONOSCOPY WITH PROPOFOL ?BIOPSY ? ?  ? ?Patient location during evaluation: Endoscopy ?Anesthesia Type: MAC ?Level of consciousness: awake and alert ?Pain management: pain level controlled ?Vital Signs Assessment: post-procedure vital signs reviewed and stable ?Respiratory status: spontaneous breathing, nonlabored ventilation, respiratory function stable and patient connected to nasal cannula oxygen ?Cardiovascular status: blood pressure returned to baseline and stable ?Postop Assessment: no apparent nausea or vomiting ?Anesthetic complications: no ? ? ?No notable events documented. ? ?Last Vitals:  ?Vitals:  ? 06/21/21 0900 06/21/21 0910  ?BP: 131/90 (!) 119/97  ?Pulse: 84 80  ?Resp: 19 (!) 21  ?Temp: (!) 36.3 ?C   ?SpO2: 96% 94%  ?  ?Last Pain:  ?Vitals:  ? 06/21/21 0910  ?TempSrc:   ?PainSc: 0-No pain  ? ? ?  ?  ?  ?  ?  ?  ? ?[name] ? ? ? ? ?

## 2021-06-22 LAB — SURGICAL PATHOLOGY

## 2021-06-23 DIAGNOSIS — M25521 Pain in right elbow: Secondary | ICD-10-CM | POA: Diagnosis not present

## 2021-06-23 DIAGNOSIS — M7711 Lateral epicondylitis, right elbow: Secondary | ICD-10-CM | POA: Diagnosis not present

## 2021-06-24 ENCOUNTER — Other Ambulatory Visit (HOSPITAL_COMMUNITY): Payer: Self-pay

## 2021-06-24 DIAGNOSIS — D869 Sarcoidosis, unspecified: Secondary | ICD-10-CM | POA: Diagnosis not present

## 2021-06-24 DIAGNOSIS — H44113 Panuveitis, bilateral: Secondary | ICD-10-CM | POA: Diagnosis not present

## 2021-06-24 DIAGNOSIS — M5442 Lumbago with sciatica, left side: Secondary | ICD-10-CM | POA: Diagnosis not present

## 2021-06-24 DIAGNOSIS — Z79899 Other long term (current) drug therapy: Secondary | ICD-10-CM | POA: Diagnosis not present

## 2021-06-24 DIAGNOSIS — M2550 Pain in unspecified joint: Secondary | ICD-10-CM | POA: Diagnosis not present

## 2021-06-24 DIAGNOSIS — Z79624 Long term (current) use of inhibitors of nucleotide synthesis: Secondary | ICD-10-CM | POA: Diagnosis not present

## 2021-06-24 DIAGNOSIS — M5441 Lumbago with sciatica, right side: Secondary | ICD-10-CM | POA: Diagnosis not present

## 2021-06-24 DIAGNOSIS — M7711 Lateral epicondylitis, right elbow: Secondary | ICD-10-CM | POA: Diagnosis not present

## 2021-06-24 DIAGNOSIS — G932 Benign intracranial hypertension: Secondary | ICD-10-CM | POA: Diagnosis not present

## 2021-06-24 DIAGNOSIS — G8929 Other chronic pain: Secondary | ICD-10-CM | POA: Diagnosis not present

## 2021-06-24 MED ORDER — PREVNAR 13 IM SUSP
0.5000 mL | Freq: Every day | INTRAMUSCULAR | 0 refills | Status: DC
  Filled 2021-06-24 (×2): qty 0.5, 1d supply, fill #0

## 2021-06-24 MED ORDER — AZATHIOPRINE 50 MG PO TABS
200.0000 mg | ORAL_TABLET | Freq: Every day | ORAL | 3 refills | Status: DC
  Filled 2021-06-24: qty 360, 90d supply, fill #0
  Filled 2021-09-15: qty 360, 90d supply, fill #1
  Filled 2022-01-13: qty 310, 77d supply, fill #2
  Filled 2022-01-17: qty 50, 13d supply, fill #2
  Filled 2022-04-25: qty 360, 90d supply, fill #3

## 2021-06-27 DIAGNOSIS — G4712 Idiopathic hypersomnia without long sleep time: Secondary | ICD-10-CM | POA: Diagnosis not present

## 2021-06-27 DIAGNOSIS — G4733 Obstructive sleep apnea (adult) (pediatric): Secondary | ICD-10-CM | POA: Diagnosis not present

## 2021-06-28 ENCOUNTER — Other Ambulatory Visit (HOSPITAL_COMMUNITY): Payer: Self-pay

## 2021-06-29 ENCOUNTER — Other Ambulatory Visit (HOSPITAL_COMMUNITY): Payer: Self-pay

## 2021-06-30 ENCOUNTER — Other Ambulatory Visit (HOSPITAL_COMMUNITY): Payer: Self-pay

## 2021-06-30 DIAGNOSIS — M25521 Pain in right elbow: Secondary | ICD-10-CM | POA: Diagnosis not present

## 2021-06-30 DIAGNOSIS — M7711 Lateral epicondylitis, right elbow: Secondary | ICD-10-CM | POA: Diagnosis not present

## 2021-07-01 ENCOUNTER — Other Ambulatory Visit (HOSPITAL_COMMUNITY): Payer: Self-pay

## 2021-07-01 MED ORDER — VYVANSE 40 MG PO CAPS
40.0000 mg | ORAL_CAPSULE | Freq: Every morning | ORAL | 0 refills | Status: DC
  Filled 2021-07-01: qty 90, 90d supply, fill #0

## 2021-07-05 ENCOUNTER — Other Ambulatory Visit (HOSPITAL_COMMUNITY): Payer: Self-pay

## 2021-07-08 ENCOUNTER — Other Ambulatory Visit (HOSPITAL_COMMUNITY): Payer: Self-pay

## 2021-07-14 DIAGNOSIS — G4733 Obstructive sleep apnea (adult) (pediatric): Secondary | ICD-10-CM | POA: Diagnosis not present

## 2021-07-14 DIAGNOSIS — J069 Acute upper respiratory infection, unspecified: Secondary | ICD-10-CM | POA: Diagnosis not present

## 2021-07-14 DIAGNOSIS — J45901 Unspecified asthma with (acute) exacerbation: Secondary | ICD-10-CM | POA: Diagnosis not present

## 2021-07-15 ENCOUNTER — Other Ambulatory Visit (HOSPITAL_COMMUNITY): Payer: Self-pay

## 2021-07-29 DIAGNOSIS — H40041 Steroid responder, right eye: Secondary | ICD-10-CM | POA: Diagnosis not present

## 2021-08-08 ENCOUNTER — Other Ambulatory Visit (HOSPITAL_COMMUNITY): Payer: Self-pay

## 2021-08-08 DIAGNOSIS — G4712 Idiopathic hypersomnia without long sleep time: Secondary | ICD-10-CM | POA: Diagnosis not present

## 2021-08-08 DIAGNOSIS — E119 Type 2 diabetes mellitus without complications: Secondary | ICD-10-CM | POA: Diagnosis not present

## 2021-08-08 DIAGNOSIS — F5081 Binge eating disorder: Secondary | ICD-10-CM | POA: Diagnosis not present

## 2021-08-08 DIAGNOSIS — G4733 Obstructive sleep apnea (adult) (pediatric): Secondary | ICD-10-CM | POA: Diagnosis not present

## 2021-08-08 MED ORDER — METHYLPHENIDATE HCL 10 MG PO TABS
10.0000 mg | ORAL_TABLET | Freq: Every day | ORAL | 0 refills | Status: DC
  Filled 2021-08-08: qty 30, 30d supply, fill #0

## 2021-08-11 ENCOUNTER — Other Ambulatory Visit (HOSPITAL_COMMUNITY): Payer: Self-pay

## 2021-09-15 ENCOUNTER — Other Ambulatory Visit (HOSPITAL_COMMUNITY): Payer: Self-pay

## 2021-09-16 ENCOUNTER — Other Ambulatory Visit (HOSPITAL_COMMUNITY): Payer: Self-pay

## 2021-09-16 MED ORDER — ATORVASTATIN CALCIUM 10 MG PO TABS
10.0000 mg | ORAL_TABLET | ORAL | 1 refills | Status: DC
  Filled 2021-09-16: qty 13, 90d supply, fill #0
  Filled 2022-01-13: qty 13, 90d supply, fill #1

## 2021-09-16 MED ORDER — METHYLPHENIDATE HCL 10 MG PO TABS
10.0000 mg | ORAL_TABLET | Freq: Every day | ORAL | 0 refills | Status: DC
  Filled 2021-09-16: qty 30, 30d supply, fill #0

## 2021-09-16 MED ORDER — VYVANSE 40 MG PO CAPS
40.0000 mg | ORAL_CAPSULE | Freq: Every morning | ORAL | 0 refills | Status: DC
  Filled 2021-09-29: qty 90, 90d supply, fill #0

## 2021-09-28 ENCOUNTER — Other Ambulatory Visit (HOSPITAL_COMMUNITY): Payer: Self-pay

## 2021-09-28 DIAGNOSIS — E119 Type 2 diabetes mellitus without complications: Secondary | ICD-10-CM | POA: Diagnosis not present

## 2021-09-28 DIAGNOSIS — F5081 Binge eating disorder: Secondary | ICD-10-CM | POA: Diagnosis not present

## 2021-09-28 DIAGNOSIS — G4712 Idiopathic hypersomnia without long sleep time: Secondary | ICD-10-CM | POA: Diagnosis not present

## 2021-09-28 DIAGNOSIS — G4733 Obstructive sleep apnea (adult) (pediatric): Secondary | ICD-10-CM | POA: Diagnosis not present

## 2021-09-28 MED ORDER — ARMODAFINIL 250 MG PO TABS
250.0000 mg | ORAL_TABLET | Freq: Every day | ORAL | 1 refills | Status: DC
  Filled 2021-09-28 – 2021-10-26 (×3): qty 90, 90d supply, fill #0
  Filled 2022-01-13 – 2022-02-03 (×2): qty 90, 90d supply, fill #1

## 2021-09-30 ENCOUNTER — Other Ambulatory Visit (HOSPITAL_COMMUNITY): Payer: Self-pay

## 2021-10-03 ENCOUNTER — Other Ambulatory Visit (HOSPITAL_COMMUNITY): Payer: Self-pay

## 2021-10-03 DIAGNOSIS — M25562 Pain in left knee: Secondary | ICD-10-CM | POA: Diagnosis not present

## 2021-10-03 DIAGNOSIS — R070 Pain in throat: Secondary | ICD-10-CM | POA: Diagnosis not present

## 2021-10-03 DIAGNOSIS — F3181 Bipolar II disorder: Secondary | ICD-10-CM | POA: Diagnosis not present

## 2021-10-03 DIAGNOSIS — E1165 Type 2 diabetes mellitus with hyperglycemia: Secondary | ICD-10-CM | POA: Diagnosis not present

## 2021-10-03 DIAGNOSIS — F5081 Binge eating disorder: Secondary | ICD-10-CM | POA: Diagnosis not present

## 2021-10-03 DIAGNOSIS — Z03818 Encounter for observation for suspected exposure to other biological agents ruled out: Secondary | ICD-10-CM | POA: Diagnosis not present

## 2021-10-03 DIAGNOSIS — G4733 Obstructive sleep apnea (adult) (pediatric): Secondary | ICD-10-CM | POA: Diagnosis not present

## 2021-10-03 DIAGNOSIS — R059 Cough, unspecified: Secondary | ICD-10-CM | POA: Diagnosis not present

## 2021-10-03 DIAGNOSIS — J45991 Cough variant asthma: Secondary | ICD-10-CM | POA: Diagnosis not present

## 2021-10-03 MED ORDER — ARIPIPRAZOLE 5 MG PO TABS
5.0000 mg | ORAL_TABLET | Freq: Every evening | ORAL | 1 refills | Status: DC
  Filled 2021-10-03: qty 90, 90d supply, fill #0
  Filled 2022-01-13: qty 90, 90d supply, fill #1

## 2021-10-03 MED ORDER — OZEMPIC (1 MG/DOSE) 4 MG/3ML ~~LOC~~ SOPN
1.0000 mg | PEN_INJECTOR | SUBCUTANEOUS | 1 refills | Status: DC
  Filled 2021-10-03: qty 3, 28d supply, fill #0
  Filled 2021-10-21 – 2021-11-04 (×2): qty 3, 28d supply, fill #1
  Filled 2021-12-12: qty 3, 28d supply, fill #2
  Filled 2022-01-13: qty 3, 28d supply, fill #3
  Filled 2022-02-22: qty 3, 28d supply, fill #4
  Filled 2022-04-25: qty 3, 28d supply, fill #5

## 2021-10-03 MED ORDER — ONDANSETRON HCL 4 MG PO TABS
4.0000 mg | ORAL_TABLET | Freq: Three times a day (TID) | ORAL | 2 refills | Status: DC | PRN
  Filled 2021-10-03: qty 30, 10d supply, fill #0
  Filled 2021-10-21: qty 30, 10d supply, fill #1
  Filled 2022-02-22: qty 30, 10d supply, fill #2

## 2021-10-03 MED ORDER — DULOXETINE HCL 60 MG PO CPEP
60.0000 mg | ORAL_CAPSULE | Freq: Every day | ORAL | 1 refills | Status: DC
  Filled 2021-10-03: qty 90, 90d supply, fill #0
  Filled 2022-02-22: qty 90, 90d supply, fill #1

## 2021-10-03 MED ORDER — MONTELUKAST SODIUM 10 MG PO TABS
10.0000 mg | ORAL_TABLET | Freq: Every evening | ORAL | 1 refills | Status: DC
  Filled 2021-10-03: qty 90, 90d supply, fill #0

## 2021-10-07 DIAGNOSIS — H40041 Steroid responder, right eye: Secondary | ICD-10-CM | POA: Diagnosis not present

## 2021-10-07 DIAGNOSIS — Z79899 Other long term (current) drug therapy: Secondary | ICD-10-CM | POA: Diagnosis not present

## 2021-10-07 DIAGNOSIS — G932 Benign intracranial hypertension: Secondary | ICD-10-CM | POA: Diagnosis not present

## 2021-10-07 DIAGNOSIS — R21 Rash and other nonspecific skin eruption: Secondary | ICD-10-CM | POA: Diagnosis not present

## 2021-10-07 DIAGNOSIS — H44113 Panuveitis, bilateral: Secondary | ICD-10-CM | POA: Diagnosis not present

## 2021-10-07 DIAGNOSIS — R7303 Prediabetes: Secondary | ICD-10-CM | POA: Diagnosis not present

## 2021-10-07 DIAGNOSIS — H43813 Vitreous degeneration, bilateral: Secondary | ICD-10-CM | POA: Diagnosis not present

## 2021-10-07 DIAGNOSIS — D869 Sarcoidosis, unspecified: Secondary | ICD-10-CM | POA: Diagnosis not present

## 2021-10-21 ENCOUNTER — Other Ambulatory Visit (HOSPITAL_COMMUNITY): Payer: Self-pay

## 2021-10-25 ENCOUNTER — Other Ambulatory Visit (HOSPITAL_COMMUNITY): Payer: Self-pay

## 2021-10-26 ENCOUNTER — Other Ambulatory Visit (HOSPITAL_COMMUNITY): Payer: Self-pay

## 2021-10-31 ENCOUNTER — Ambulatory Visit
Admission: RE | Admit: 2021-10-31 | Discharge: 2021-10-31 | Disposition: A | Discharge status: Home / Self Care | Payer: 59 | Source: Ambulatory Visit | Attending: Physician Assistant | Admitting: Physician Assistant

## 2021-10-31 ENCOUNTER — Ambulatory Visit (INDEPENDENT_AMBULATORY_CARE_PROVIDER_SITE_OTHER): Payer: 59

## 2021-10-31 VITALS — BP 147/89 | HR 101 | Temp 98.1°F | Resp 18

## 2021-10-31 DIAGNOSIS — M25562 Pain in left knee: Secondary | ICD-10-CM | POA: Diagnosis not present

## 2021-10-31 DIAGNOSIS — S8992XA Unspecified injury of left lower leg, initial encounter: Secondary | ICD-10-CM | POA: Diagnosis not present

## 2021-10-31 MED ORDER — PREDNISONE 20 MG PO TABS
40.0000 mg | ORAL_TABLET | Freq: Every day | ORAL | 0 refills | Status: AC
  Filled 2021-10-31: qty 10, 5d supply, fill #0

## 2021-10-31 NOTE — ED Provider Notes (Signed)
[name]    CSN: [id number] Arrival date & time: 10/31/21  1637      History   Chief Complaint Chief Complaint  Patient presents with   Knee Pain    Entered by patient    HPI Denise Cook is a 46 y.o. female.   Patient here today for evaluation of pain and swelling in her left anterior knee after a fall a week ago.  She reports that she was stepping off a curb and fell onto her left knee.  She has been taking Tylenol and ibuprofen as well as using ice and elevation with mild relief.  She is able to bear weight but this is painful.  The history is provided by the patient.  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, right eye, mild stage 05/14/2015   Sarcoid 12/18/2014   Encounter for long-term (current) use of high-risk medication 12/18/2014   Uveitic glaucoma of both eyes, 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 11/20/2014   Panuveitis, 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   IIH (idiopathic intracranial hypertension) 04/21/2013   Leg pain, bilateral 04/15/2013   Sprain of ankle 06/24/2011    Past Surgical History:  Procedure Laterality Date   ABDOMINAL HYSTERECTOMY     BIOPSY  06/21/2021   Procedure: BIOPSY;  Surgeon: Ronnette Juniper, MD;  Location: [facility];  Service: Gastroenterology;;   CARDIAC CATHETERIZATION     CHOLECYSTECTOMY     COLONOSCOPY  WITH PROPOFOL N/A 06/21/2021   Procedure: COLONOSCOPY WITH PROPOFOL;  Surgeon: Ronnette Juniper, MD;  Location: [facility];  Service: Gastroenterology;  Laterality: N/A;   EYE SURGERY Left    Removed vitreous membrane   glaucoma shunt     INCISION AND DRAINAGE Right 05/01/2014   Procedure: INCISION AND DRAINAGE RIGHT INDEX FLEXOR SHEATH  ;  Surgeon: Charlotte Crumb, MD;  Location: [place] [facility];  Service: Orthopedics;  Laterality: Right;   left eye surgery      OB History   No obstetric history on file.      Home Medications    Prior to Admission medications   Medication Sig Start Date End Date Taking? Authorizing Provider  predniSONE (DELTASONE) 20 MG tablet Take 2 tablets (40 mg total) by mouth daily with breakfast for 5 days. 10/31/21 11/05/21 Yes Francene Finders, PA-C  acetaminophen (TYLENOL) 500 MG tablet Take 1,000 mg by mouth every 6 (six) hours as needed for headache. Reported on 07/06/2015    [provider]  acetaZOLAMIDE ER (DIAMOX) 500 MG capsule Take 1 capsule (500 mg total) by mouth 2 (two) times daily. 05/09/21 10/13/21  Alric Ran, MD  albuterol (VENTOLIN HFA) 108 (90 Base) MCG/ACT inhaler Inhale 1-2 puffs into the lungs every 4 (four) hours as needed for wheezing 03/15/21     ARIPiprazole (ABILIFY) 5 MG  tablet Take 1 tablet (5 mg total) by mouth at bedtime. 10/03/21     Armodafinil 250 MG tablet Take 1 tablet (250 mg total) by mouth daily. Patient not taking: Reported on 06/16/2021 09/30/20     Armodafinil 250 MG tablet Take 1 tablet (250 mg total) by mouth daily. 09/28/21     atorvastatin (LIPITOR) 10 MG tablet Take 1 tablet (10 mg total) by mouth once a week. 09/16/21     azaTHIOprine (IMURAN) 50 MG tablet Take 200 mg by mouth daily.    [provider]  azaTHIOprine (IMURAN) 50 MG tablet Take 4 tablets (200 mg total) by mouth daily. 06/24/21     Blood Glucose Monitoring Suppl (FIFTY50 GLUCOSE METER 2.0) w/Device KIT USE TO CHECK BLOOD SUGAR ONCE  DAILY. ALTERNATING MORNINGS AND EVENINGS BEFORE MEALS 08/20/17   [provider]  brimonidine (ALPHAGAN) 0.2 % ophthalmic solution Place 1 drop into the left eye 3 (three) times daily. Patient taking differently: Place 1 drop into the left eye daily. 03/11/21     brimonidine-timolol (COMBIGAN) 0.2-0.5 % ophthalmic solution Place 1 drop into both eyes daily. 09/20/18   [provider]  Cholecalciferol (VITAMIN D3) 5000 units CAPS Take 5,000 Units by mouth every morning.     [provider]  DULoxetine (CYMBALTA) 60 MG capsule Take 1 capsule (60 mg total) by mouth daily. Patient not taking: Reported on 06/16/2021 09/06/20     DULoxetine (CYMBALTA) 60 MG capsule Take 1 capsule (60 mg total) by mouth daily. 10/03/21     levocetirizine (XYZAL) 5 MG tablet Take 1 tablet (5 mg total) by mouth every morning as needed for allergies. 04/08/21     lisdexamfetamine (VYVANSE) 40 MG capsule Take 1 capsule (40 mg total) by mouth every morning. 06/30/21     lisdexamfetamine (VYVANSE) 40 MG capsule Take 1 capsule (40 mg total) by mouth in the morning. 09/28/21     lisdexamfetamine (VYVANSE) 60 MG capsule Take 60 mg by mouth in the morning.    [provider]  Magnesium 250 MG TABS Take 250 mg by mouth daily.    [provider]  methylphenidate (RITALIN) 10 MG tablet Take 1 tablet (10 mg total) by mouth daily. 09/16/21     metoCLOPramide (REGLAN) 10 MG tablet Take 1 tablet (10 mg total) by mouth every 8 (eight) hours as needed for nausea. 11/26/19   Tasia Catchings, Amy V, PA-C  montelukast (SINGULAIR) 10 MG tablet Take 1 tablet (10 mg total) by mouth every evening. 03/15/21     montelukast (SINGULAIR) 10 MG tablet Take 1 tablet (10 mg total) by mouth every evening. 10/03/21     ondansetron (ZOFRAN) 4 MG tablet Take 1 tablet (4 mg total) by mouth every 8 (eight) hours as needed for nausea Patient not taking: Reported on 06/16/2021 03/15/21     ondansetron (ZOFRAN) 4 MG tablet Take 1 tablet (4 mg  total) by mouth every 8 (eight) hours as needed for nausea 10/03/21     ondansetron (ZOFRAN) 8 MG tablet Take 1/2 to 1 tablet by mouth every 8 hours as needed for nausea 08/10/20   Maury Dus, MD  ONE TOUCH ULTRA TEST test strip USE TO CHECK BLOOD SUGAR ONCE DAILY. ALTERNATING MORNINGS AND EVENINGS BEFORE MEALS 08/20/17   [provider]  oxybutynin (DITROPAN) 5 MG tablet Take 1 tablet (5 mg total) by mouth every 8 (eight) hours as needed for urinary incontinence Patient not taking: Reported on 06/16/2021 09/06/20     pneumococcal  13-valent conjugate vaccine (PREVNAR 13) SUSP injection Inject 0.5 mLs into the muscle once for 1 dose 06/24/21     polyethylene glycol-electrolytes (NULYTELY) 420 g solution Take as directed 04/26/21     prednisoLONE acetate (PRED FORTE) 1 % ophthalmic suspension Place 1 drop into both eyes in the morning and at bedtime. 06/20/19   [provider]  promethazine-dextromethorphan (PROMETHAZINE-DM) 6.25-15 MG/5ML syrup Take 5 mLs by mouth 4 (four) times daily as needed for cough. 03/26/21   Gildardo Pounds, NP  Semaglutide, 1 MG/DOSE, (OZEMPIC, 1 MG/DOSE,) 4 MG/3ML SOPN Inject 1 mg into the skin once a week. 10/03/21     tiZANidine (ZANAFLEX) 2 MG tablet Take 1 tablet (2 mg total) by mouth every 8 (eight) hours as needed for muscle spasms. Patient not taking: Reported on 06/16/2021 11/26/19   Ok Edwards, PA-C  Topiramate ER (TROKENDI XR) 200 MG CP24 Take 1 capsule by mouth at bedtime. 02/02/21   Alric Ran, MD    Family History Family History  Problem Relation Age of Onset   Alcoholism Mother    Cancer Maternal Grandmother    Diabetes Paternal Grandmother    Healthy Daughter     Social History Social History   Tobacco Use   Smoking status: Never   Smokeless tobacco: Never  Vaping Use   Vaping Use: Never used  Substance Use Topics   Alcohol use: No   Drug use: No     Allergies   Methotrexate, Other, and Metformin and related   Review of  Systems Review of Systems  Constitutional:  Negative for chills and fever.  Eyes:  Negative for discharge and redness.  Gastrointestinal:  Negative for abdominal pain, nausea and vomiting.  Musculoskeletal:  Positive for arthralgias.  Neurological:  Negative for numbness.     Physical Exam Triage Vital Signs ED Triage Vitals  Enc Vitals Group     BP      Pulse      Resp      Temp      Temp src      SpO2      Weight      Height      Head Circumference      Peak Flow      Pain Score      Pain Loc      Pain Edu?      Excl. in [place]?    No data found.  Updated Vital Signs BP (!) 147/89   Pulse (!) 101   Temp 98.1 F (36.7 C)   Resp 18   SpO2 98%      Physical Exam Vitals and nursing note reviewed.  Constitutional:      General: She is not in acute distress.    Appearance: Normal appearance. She is not ill-appearing.  HENT:     Head: Normocephalic and atraumatic.  Eyes:     Conjunctiva/sclera: Conjunctivae normal.  Cardiovascular:     Rate and Rhythm: Normal rate.  Pulmonary:     Effort: Pulmonary effort is normal.  Musculoskeletal:     Comments: Full range of motion of left knee.  Mild swelling appreciated to medial aspect of distal knee joint.  Tenderness palpation noted to same.  Healing abrasion noted to anterior knee without erythema, bleeding or drainage  Neurological:     Mental Status: She is alert.  Psychiatric:        Mood and Affect: Mood normal.        Behavior:  Behavior normal.        Thought Content: Thought content normal.      UC Treatments / Results  Labs (all labs ordered are listed, but only abnormal results are displayed) Labs Reviewed - No data to display  EKG   Radiology DG Knee AP/LAT W/[place] Left  Result Date: 10/31/2021 CLINICAL DATA:  Injury. EXAM: LEFT KNEE 3 VIEWS COMPARISON:  None Available. FINDINGS: No joint effusion. Joint spaces are preserved. No acute fracture is seen. No dislocation. Minimal chronic enthesopathic  change at the quadriceps insertion on the patella. IMPRESSION: No acute fracture. Electronically Signed   By: Yvonne Kendall M.D.   On: 10/31/2021 17:38    Procedures Procedures (including critical care time)  Medications Ordered in UC Medications - No data to display  Initial Impression / Assessment and Plan / UC Course  I have reviewed the triage vital signs and the nursing notes.  Pertinent labs & imaging results that were available during my care of the patient were reviewed by me and considered in my medical decision making (see chart for details).    Knee x-ray without fracture.  Will treat with steroids and recommended rice therapy.  Encouraged follow-up with ortho if symptoms fail to improve or worsen.  Final Clinical Impressions(s) / UC Diagnoses   Final diagnoses:  Injury of left knee, initial encounter   Discharge Instructions   None    ED Prescriptions     Medication Sig Dispense Auth. Provider   predniSONE (DELTASONE) 20 MG tablet Take 2 tablets (40 mg total) by mouth daily with breakfast for 5 days. 10 tablet Francene Finders, PA-C      PDMP not reviewed this encounter.   Francene Finders, PA-C 10/31/21 [phone]

## 2021-10-31 NOTE — ED Triage Notes (Signed)
Pt reports pain and swelling in L knee following an injury from stepping off a curb and hitting it. Pt reports she has been doing tylenol. Motrin, ice and elevation with minimal improvement in symptoms

## 2021-11-01 ENCOUNTER — Other Ambulatory Visit (HOSPITAL_COMMUNITY): Payer: Self-pay

## 2021-11-01 DIAGNOSIS — L818 Other specified disorders of pigmentation: Secondary | ICD-10-CM | POA: Diagnosis not present

## 2021-11-07 ENCOUNTER — Other Ambulatory Visit (HOSPITAL_COMMUNITY): Payer: Self-pay

## 2021-11-28 ENCOUNTER — Other Ambulatory Visit (HOSPITAL_COMMUNITY): Payer: Self-pay

## 2021-11-28 DIAGNOSIS — M5412 Radiculopathy, cervical region: Secondary | ICD-10-CM | POA: Diagnosis not present

## 2021-11-28 MED ORDER — GABAPENTIN 100 MG PO CAPS
100.0000 mg | ORAL_CAPSULE | Freq: Three times a day (TID) | ORAL | 0 refills | Status: DC
Start: 2021-11-28 — End: 2022-05-09
  Filled 2021-11-28: qty 21, 7d supply, fill #0

## 2021-11-28 MED ORDER — METHOCARBAMOL 750 MG PO TABS
750.0000 mg | ORAL_TABLET | Freq: Three times a day (TID) | ORAL | 0 refills | Status: DC
Start: 2021-11-28 — End: 2022-05-09
  Filled 2021-11-28: qty 21, 7d supply, fill #0

## 2021-11-30 DIAGNOSIS — U071 COVID-19: Secondary | ICD-10-CM | POA: Diagnosis not present

## 2021-12-07 ENCOUNTER — Other Ambulatory Visit: Payer: Self-pay | Admitting: Family Medicine

## 2021-12-07 DIAGNOSIS — R220 Localized swelling, mass and lump, head: Secondary | ICD-10-CM

## 2021-12-12 ENCOUNTER — Other Ambulatory Visit (HOSPITAL_COMMUNITY): Payer: Self-pay

## 2021-12-12 ENCOUNTER — Ambulatory Visit
Admission: RE | Admit: 2021-12-12 | Discharge: 2021-12-12 | Disposition: A | Discharge status: Home / Self Care | Payer: 59 | Source: Ambulatory Visit | Attending: Family Medicine | Admitting: Family Medicine

## 2021-12-12 DIAGNOSIS — J3489 Other specified disorders of nose and nasal sinuses: Secondary | ICD-10-CM | POA: Diagnosis not present

## 2021-12-12 DIAGNOSIS — R220 Localized swelling, mass and lump, head: Secondary | ICD-10-CM

## 2021-12-12 MED ORDER — GADOBENATE DIMEGLUMINE 529 MG/ML IV SOLN
20.0000 mL | Freq: Once | INTRAVENOUS | Status: AC | PRN
  Administered 2021-12-12: 20 mL via INTRAVENOUS

## 2022-01-02 ENCOUNTER — Other Ambulatory Visit (HOSPITAL_COMMUNITY): Payer: Self-pay

## 2022-01-02 DIAGNOSIS — G4712 Idiopathic hypersomnia without long sleep time: Secondary | ICD-10-CM | POA: Diagnosis not present

## 2022-01-02 DIAGNOSIS — G4733 Obstructive sleep apnea (adult) (pediatric): Secondary | ICD-10-CM | POA: Diagnosis not present

## 2022-01-02 MED ORDER — METHYLPHENIDATE HCL 10 MG PO TABS
10.0000 mg | ORAL_TABLET | Freq: Every day | ORAL | 0 refills | Status: DC
  Filled 2022-01-02: qty 8, 8d supply, fill #0
  Filled 2022-01-02: qty 22, 22d supply, fill #0

## 2022-01-04 ENCOUNTER — Encounter: Payer: Self-pay | Admitting: Pulmonary Disease

## 2022-01-04 ENCOUNTER — Ambulatory Visit: Payer: 59 | Admitting: Pulmonary Disease

## 2022-01-04 ENCOUNTER — Ambulatory Visit (INDEPENDENT_AMBULATORY_CARE_PROVIDER_SITE_OTHER): Payer: 59

## 2022-01-04 VITALS — BP 136/74 | HR 96 | Temp 98.4°F | Ht 61.5 in | Wt 286.4 lb

## 2022-01-04 DIAGNOSIS — D869 Sarcoidosis, unspecified: Secondary | ICD-10-CM | POA: Diagnosis not present

## 2022-01-04 DIAGNOSIS — G4711 Idiopathic hypersomnia with long sleep time: Secondary | ICD-10-CM

## 2022-01-04 NOTE — Assessment & Plan Note (Signed)
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  The lesion on the forearm appears to be postinflammatory scarring rather than sarcoid but she does have a history suggestive of sarcoid skin lesions. 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  She will use Symbicort on an as-needed basis

## 2022-01-04 NOTE — Patient Instructions (Signed)
X chest x-ray for sarcoidosis  Trial of Mucinex 600 mg twice a day for 7 days

## 2022-01-04 NOTE — Progress Notes (Signed)
Subjective:    Patient ID: Denise Cook, female    DOB: Oct 09, 1975, 46 y.o.   MRN: [id number]  HPI  Denise Cook is a 46 year old [employer] employee who works at [employer] and presents for evaluation of sarcoidosis. She was seen by dermatologist for scarlike lesion on right forearm and referred for evaluation of sarcoidosis. She reports a diagnosis of sarcoidosis in 2010 .  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  I note CT chest in 2006 and 2009 which did not show any lymphadenopathy or parenchymal infiltrates ACE level was 69 in 2011.  She reports dyspnea on exertion.  She reports cough productive of stringy thick mucus which occasionally makes her choke, this has been going on for years. Chest x-ray 03/2021 was clear. CT angiogram chest 09/2015 shows clear lungs and fatty liver  I have reviewed extensive PCP notes. I have reviewed sleep evaluation by Dr. Elenore Cook and Denise Cook and previous sleep studies.  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  Significant tests/ events reviewed   PFTs 09/2018 ([facility] ) ratio 77, FEV1 78%, FVC 87%, TLC 95%, DLCO 21.9/109% 05/2018 NPSG >> very mild, AHI 6.7/hour, RDI 8/hour, worse during REM, lowest desaturation 87% MSLT -sleep latency of 46 seconds, no SoREMs   Past Medical History:  Diagnosis Date   Allergy    Asthma    Depression    Diabetes mellitus    diet control   Glaucoma    Obesity    Pseudotumor cerebri    Sarcoidosis 2012   Past Surgical History:  Procedure Laterality Date   ABDOMINAL HYSTERECTOMY     BIOPSY  06/21/2021   Procedure: BIOPSY;  Surgeon: Denise Juniper, MD;  Location: [facility];  Service: Gastroenterology;;   CARDIAC CATHETERIZATION     CHOLECYSTECTOMY     COLONOSCOPY WITH PROPOFOL N/A 06/21/2021   Procedure: COLONOSCOPY WITH PROPOFOL;  Surgeon: Denise Juniper, MD;  Location: [facility];  Service: Gastroenterology;  Laterality: N/A;   EYE SURGERY Left    Removed vitreous membrane   glaucoma shunt     INCISION AND DRAINAGE Right 05/01/2014   Procedure: INCISION AND DRAINAGE RIGHT INDEX FLEXOR SHEATH  ;  Surgeon: Charlotte Crumb, MD;  Location: [place];  Service: Orthopedics;  Laterality: Right;   left eye surgery      Allergies  Allergen Reactions   Methotrexate Other (See Comments)    Unknown reaction   Other Other (See Comments)    PEPPER sriracha turned tongue black    Metformin And Related Itching    Social History   Socioeconomic History   Marital status: Single    Spouse name: Not on file   Number of children: 1   Years of education: Not on file   Highest education level: Not on file  Occupational History   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   She lives alone.  She has one grown daughter (60).   Social Determinants of Health   Financial Resource Strain: Not on file  Food Insecurity: Not on file  Transportation Needs: Not on file  Physical Activity: Not on file  Stress: Not on file  Social Connections: Not on file  Intimate Partner Violence: Not on file    Family History  Problem Relation Age of Onset   Alcoholism Mother    Cancer Maternal Grandmother     Diabetes Paternal Grandmother    Healthy Daughter      Review of Systems Shortness of breath with activity Irregular heartbeat Acid heartburn Anxiety and depression Joint stiffness   Constitutional: negative for anorexia, fevers and sweats  Eyes: negative for irritation, redness and visual disturbance  Ears, nose, mouth, throat, and face: negative for earaches, epistaxis, nasal congestion and sore throat  Respiratory: negative for wheezing  Cardiovascular: negative for chest pain,  lower extremity edema, orthopnea, palpitations and syncope  Gastrointestinal: negative for abdominal pain, constipation, diarrhea, melena, nausea and vomiting  Genitourinary:negative for dysuria, frequency and hematuria  Hematologic/lymphatic: negative for bleeding, easy bruising and lymphadenopathy  Musculoskeletal:negative for arthralgias, muscle weakness  Neurological: negative for coordination problems, gait problems, headaches and weakness  Endocrine: negative for diabetic symptoms including polydipsia, polyuria and weight loss     Objective:   Physical Exam  Gen. Pleasant, obese, in no distress, normal affect ENT - no pallor,icterus, no post nasal drip, class 2-3 airway Neck: No JVD, no thyromegaly, no carotid bruits Lungs: no use of accessory muscles, no dullness to percussion, decreased without rales or rhonchi  Cardiovascular: Rhythm regular, heart sounds  normal, no murmurs or gallops, no peripheral edema Abdomen: soft and non-tender, no hepatosplenomegaly, BS normal. Musculoskeletal: No deformities, no cyanosis or clubbing Neuro:  alert, non focal, no tremors       Assessment & Plan:

## 2022-01-04 NOTE — Assessment & Plan Note (Signed)
this is primarily managed by Carlos Levering at [place].  Although this has been labeled as idiopathic hypersomnia, with a body habitus, it is more likely that she has OSA and previous sleep study did not accurately pick this up.  Surprisingly only showed mild OSA. I would suggest repeating a home sleep test.  I emphasized compliance with her CPAP machine. Currently she is maintained on modafinil in the morning which she takes around 11 AM after waking up and Ritalin around 6 PM especially when she has to work until 1 AM

## 2022-01-05 ENCOUNTER — Other Ambulatory Visit (HOSPITAL_COMMUNITY): Payer: Self-pay

## 2022-01-05 MED ORDER — BETAMETHASONE DIPROPIONATE 0.05 % EX CREA
TOPICAL_CREAM | Freq: Two times a day (BID) | CUTANEOUS | 3 refills | Status: DC | PRN
  Filled 2022-01-05: qty 45, 30d supply, fill #0
  Filled 2022-02-22: qty 45, 30d supply, fill #1

## 2022-01-13 ENCOUNTER — Other Ambulatory Visit (HOSPITAL_COMMUNITY): Payer: Self-pay

## 2022-01-16 ENCOUNTER — Other Ambulatory Visit (HOSPITAL_COMMUNITY): Payer: Self-pay

## 2022-01-16 MED ORDER — ALBUTEROL SULFATE HFA 108 (90 BASE) MCG/ACT IN AERS
1.0000 | INHALATION_SPRAY | RESPIRATORY_TRACT | 3 refills | Status: DC | PRN
  Filled 2022-01-16: qty 6.7, 7d supply, fill #0

## 2022-01-16 MED ORDER — LISDEXAMFETAMINE DIMESYLATE 40 MG PO CAPS
40.0000 mg | ORAL_CAPSULE | Freq: Every morning | ORAL | 0 refills | Status: DC
  Filled 2022-01-16: qty 90, 90d supply, fill #0

## 2022-01-17 ENCOUNTER — Other Ambulatory Visit (HOSPITAL_COMMUNITY): Payer: Self-pay

## 2022-02-03 ENCOUNTER — Other Ambulatory Visit (HOSPITAL_COMMUNITY): Payer: Self-pay

## 2022-02-13 ENCOUNTER — Other Ambulatory Visit (HOSPITAL_COMMUNITY): Payer: Self-pay

## 2022-02-13 DIAGNOSIS — G4733 Obstructive sleep apnea (adult) (pediatric): Secondary | ICD-10-CM | POA: Diagnosis not present

## 2022-02-13 DIAGNOSIS — G4712 Idiopathic hypersomnia without long sleep time: Secondary | ICD-10-CM | POA: Diagnosis not present

## 2022-02-13 MED ORDER — METHYLPHENIDATE HCL 10 MG PO TABS
10.0000 mg | ORAL_TABLET | Freq: Every day | ORAL | 0 refills | Status: DC
  Filled 2022-02-13: qty 30, 30d supply, fill #0

## 2022-02-17 DIAGNOSIS — H40041 Steroid responder, right eye: Secondary | ICD-10-CM | POA: Diagnosis not present

## 2022-02-17 DIAGNOSIS — R7303 Prediabetes: Secondary | ICD-10-CM | POA: Diagnosis not present

## 2022-02-17 DIAGNOSIS — D869 Sarcoidosis, unspecified: Secondary | ICD-10-CM | POA: Diagnosis not present

## 2022-02-17 DIAGNOSIS — H43813 Vitreous degeneration, bilateral: Secondary | ICD-10-CM | POA: Diagnosis not present

## 2022-02-17 DIAGNOSIS — J45901 Unspecified asthma with (acute) exacerbation: Secondary | ICD-10-CM | POA: Diagnosis not present

## 2022-02-17 DIAGNOSIS — Z79899 Other long term (current) drug therapy: Secondary | ICD-10-CM | POA: Diagnosis not present

## 2022-02-17 DIAGNOSIS — H44113 Panuveitis, bilateral: Secondary | ICD-10-CM | POA: Diagnosis not present

## 2022-02-17 DIAGNOSIS — G932 Benign intracranial hypertension: Secondary | ICD-10-CM | POA: Diagnosis not present

## 2022-02-22 ENCOUNTER — Other Ambulatory Visit (HOSPITAL_COMMUNITY): Payer: Self-pay

## 2022-02-22 ENCOUNTER — Other Ambulatory Visit: Payer: Self-pay | Admitting: Neurology

## 2022-02-22 MED ORDER — TOPIRAMATE ER 200 MG PO CAP24
1.0000 | ORAL_CAPSULE | Freq: Every evening | ORAL | 3 refills | Status: DC
  Filled 2022-02-22: qty 90, 90d supply, fill #0

## 2022-02-22 MED ORDER — ALBUTEROL SULFATE HFA 108 (90 BASE) MCG/ACT IN AERS
1.0000 | INHALATION_SPRAY | RESPIRATORY_TRACT | 2 refills | Status: AC | PRN
  Filled 2022-02-22: qty 6.7, 17d supply, fill #0

## 2022-03-16 DIAGNOSIS — D869 Sarcoidosis, unspecified: Secondary | ICD-10-CM | POA: Diagnosis not present

## 2022-03-16 DIAGNOSIS — N3281 Overactive bladder: Secondary | ICD-10-CM | POA: Diagnosis not present

## 2022-03-16 DIAGNOSIS — R030 Elevated blood-pressure reading, without diagnosis of hypertension: Secondary | ICD-10-CM | POA: Diagnosis not present

## 2022-03-16 DIAGNOSIS — G932 Benign intracranial hypertension: Secondary | ICD-10-CM | POA: Diagnosis not present

## 2022-03-16 DIAGNOSIS — F5081 Binge eating disorder: Secondary | ICD-10-CM | POA: Diagnosis not present

## 2022-03-16 DIAGNOSIS — Z0000 Encounter for general adult medical examination without abnormal findings: Secondary | ICD-10-CM | POA: Diagnosis not present

## 2022-03-16 DIAGNOSIS — G4712 Idiopathic hypersomnia without long sleep time: Secondary | ICD-10-CM | POA: Diagnosis not present

## 2022-03-16 DIAGNOSIS — E782 Mixed hyperlipidemia: Secondary | ICD-10-CM | POA: Diagnosis not present

## 2022-03-16 DIAGNOSIS — F3181 Bipolar II disorder: Secondary | ICD-10-CM | POA: Diagnosis not present

## 2022-03-16 DIAGNOSIS — E1169 Type 2 diabetes mellitus with other specified complication: Secondary | ICD-10-CM | POA: Diagnosis not present

## 2022-04-02 DIAGNOSIS — F3181 Bipolar II disorder: Secondary | ICD-10-CM | POA: Diagnosis not present

## 2022-04-18 DIAGNOSIS — E559 Vitamin D deficiency, unspecified: Secondary | ICD-10-CM | POA: Diagnosis not present

## 2022-04-25 ENCOUNTER — Other Ambulatory Visit (HOSPITAL_COMMUNITY): Payer: Self-pay

## 2022-04-25 ENCOUNTER — Other Ambulatory Visit: Payer: Self-pay

## 2022-04-26 ENCOUNTER — Other Ambulatory Visit (HOSPITAL_COMMUNITY): Payer: Self-pay

## 2022-04-26 MED ORDER — ARIPIPRAZOLE 5 MG PO TABS
5.0000 mg | ORAL_TABLET | Freq: Every day | ORAL | 0 refills | Status: DC
  Filled 2022-04-26: qty 90, 90d supply, fill #0

## 2022-04-26 MED ORDER — LEVOCETIRIZINE DIHYDROCHLORIDE 5 MG PO TABS
5.0000 mg | ORAL_TABLET | Freq: Every morning | ORAL | 0 refills | Status: DC
  Filled 2022-04-26: qty 90, 90d supply, fill #0

## 2022-04-27 ENCOUNTER — Other Ambulatory Visit (HOSPITAL_COMMUNITY): Payer: Self-pay

## 2022-04-27 MED ORDER — LISDEXAMFETAMINE DIMESYLATE 40 MG PO CAPS
40.0000 mg | ORAL_CAPSULE | Freq: Every day | ORAL | 0 refills | Status: DC
  Filled 2022-04-27: qty 7, 7d supply, fill #0

## 2022-04-28 DIAGNOSIS — H209 Unspecified iridocyclitis: Secondary | ICD-10-CM | POA: Diagnosis not present

## 2022-04-28 DIAGNOSIS — H40043 Steroid responder, bilateral: Secondary | ICD-10-CM | POA: Diagnosis not present

## 2022-04-28 DIAGNOSIS — H4043X1 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, bilateral, mild stage: Secondary | ICD-10-CM | POA: Diagnosis not present

## 2022-05-02 ENCOUNTER — Other Ambulatory Visit (HOSPITAL_COMMUNITY): Payer: Self-pay

## 2022-05-02 DIAGNOSIS — G4733 Obstructive sleep apnea (adult) (pediatric): Secondary | ICD-10-CM | POA: Diagnosis not present

## 2022-05-02 DIAGNOSIS — Z6841 Body Mass Index (BMI) 40.0 and over, adult: Secondary | ICD-10-CM | POA: Diagnosis not present

## 2022-05-02 DIAGNOSIS — E1165 Type 2 diabetes mellitus with hyperglycemia: Secondary | ICD-10-CM | POA: Diagnosis not present

## 2022-05-02 DIAGNOSIS — F5081 Binge eating disorder: Secondary | ICD-10-CM | POA: Diagnosis not present

## 2022-05-02 MED ORDER — LISDEXAMFETAMINE DIMESYLATE 40 MG PO CAPS
40.0000 mg | ORAL_CAPSULE | Freq: Every morning | ORAL | 0 refills | Status: DC
  Filled 2022-05-02: qty 30, 30d supply, fill #0

## 2022-05-03 ENCOUNTER — Other Ambulatory Visit (HOSPITAL_COMMUNITY): Payer: Self-pay

## 2022-05-05 ENCOUNTER — Other Ambulatory Visit (HOSPITAL_COMMUNITY): Payer: Self-pay

## 2022-05-08 NOTE — Progress Notes (Unsigned)
[facility]  PATIENT: Denise Cook DOB: 01-29-1976  REFERRING CLINICIAN: Maury Dus, MD HISTORY FROM: Patient  REASON FOR VISIT: Headaches, history of IIH here for follow up    HISTORICAL  CHIEF COMPLAINT:  No chief complaint on file.  HPI:  Interval history 05/09/2022 JM: Patient returns for 1 year follow-up regarding IIH.  She continues on Diamox and topiramate, headaches ***.   Uses CPAP for OSA ***      History provided for reference purposes only Update 05/09/2021 Dr. April Manson:  Patient presents today for follow-up, last visit in November plan was to start her Trokendi with her Diamox and to monitor the headaches.  I also advised her to follow-up with her sleep doctor regarding her sleep apnea.  She reports compliance with the topiramate with the Diamox 500 mg twice daily, reported headaches very mild, she does not take any abortive medications.  She is currently satisfied with her current frequency.   She has not followed with her sleep doctors, still has not been using her CPAP machine.  She reported she tried exercising but is difficult for her, will have leg heaviness after 10 to 15 minutes of walking and she had to take a break.  Denies any recent falls.  Patient also reports she is interested in the gastric bypass surgery, she is approved for it but because of her history of bipolar disorder she is unsure if this will be good for her.  She does not have a psychiatrist or therapist that manage her bipolar.     Consult visit 02/02/2021 Dr. April Manson:  This is a 47 year old woman with past medical history of pseudotumor cerebri, sarcoidosis, diabetes mellitus and sleep apnea who is presenting to establish care regarding her diagnosis of pseudotumor cerebri.  Patient stated that he has been diagnosed in 2015, has been prescribed on Diamox, currently she is taking Diamox 500 mg twice daily.  She had multiple lumbar punctures, a total of 6, reported lumbar  puncture was abnormal, was told that her pressure was high.  Since diagnosis she continues to have headaches, but she reported they are not as bad as before, headache described as heaviness and achiness on top of head, they can last all day.  No photophobia or phonophobia, no migrainous feature but there is occasional visual obscuration present.  She also had a history of obesity and sleep apnea, stated that she qualify for weight loss surgery but she is scared of the surgery.  In regard of her sleep apnea she is not compliant with her CPap machine.    Headache History and Characteristics: Onset: 2015 Location: Top of head  Quality:  heaviness and achy  Intensity:4 /10.  Duration: can last a day  Migrainous Features: Photophobia, phonophobia, nausea, vomiting.  Aura: No  History of brain injury or tumor: No  Prior prophylaxis: Propranolol: No  Verapamil:No TCA: No Topamax: No Depakote: No Effexor: No Cymbalta: No Neurontin:No  Prior abortives: Triptan: No Anti-emetic: No Steroids: No Ergotamine suppository: No   OTHER MEDICAL CONDITIONS: cataract uveitis, uveitis, DM, Hypersomnia, CKD, sarcoid    REVIEW OF SYSTEMS: Full 14 system review of systems performed and negative with exception of: As noted in the HPI.  ALLERGIES: Allergies  Allergen Reactions   Methotrexate Other (See Comments)    Unknown reaction   Other Other (See Comments)    PEPPER sriracha turned tongue black    Metformin And Related Itching    HOME MEDICATIONS: Outpatient Medications Prior to Visit  Medication  Sig Dispense Refill   acetaminophen (TYLENOL) 500 MG tablet Take 1,000 mg by mouth every 6 (six) hours as needed for headache. Reported on 07/06/2015     acetaZOLAMIDE ER (DIAMOX) 500 MG capsule Take 1 capsule (500 mg total) by mouth 2 (two) times daily. 180 capsule 4   albuterol (VENTOLIN HFA) 108 (90 Base) MCG/ACT inhaler Inhale 1-2 puffs into the lungs every 4 (four) hours as needed for wheezing  18 g 5   albuterol (VENTOLIN HFA) 108 (90 Base) MCG/ACT inhaler Inhale 1-2 puffs into the lungs every 4 (four) hours as needed for wheezing 6.7 g 3   albuterol (VENTOLIN HFA) 108 (90 Base) MCG/ACT inhaler Inhale 1-2 puffs into the lungs every 4 (four) hours as needed for wheezing 6.7 g 2   ARIPiprazole (ABILIFY) 5 MG tablet Take 1 tablet (5 mg total) by mouth at bedtime. 90 tablet 0   Armodafinil 250 MG tablet Take 1 tablet (250 mg total) by mouth daily. 90 tablet 1   Armodafinil 250 MG tablet Take 1 tablet (250 mg total) by mouth daily. 90 tablet 1   atorvastatin (LIPITOR) 10 MG tablet Take 1 tablet (10 mg total) by mouth once a week. 13 tablet 1   azaTHIOprine (IMURAN) 50 MG tablet Take 200 mg by mouth daily.     azaTHIOprine (IMURAN) 50 MG tablet Take 4 tablets (200 mg total) by mouth daily. 360 tablet 3   betamethasone dipropionate 0.05 % cream Apply topically to affected area up to 2 (two) times daily as needed. (Not to face, groin, axilla) 45 g 3   Blood Glucose Monitoring Suppl (FIFTY50 GLUCOSE METER 2.0) w/Device KIT USE TO CHECK BLOOD SUGAR ONCE DAILY. ALTERNATING MORNINGS AND EVENINGS BEFORE MEALS     brimonidine (ALPHAGAN) 0.2 % ophthalmic solution Place 1 drop into the left eye 3 (three) times daily. (Patient taking differently: Place 1 drop into the left eye daily.) 5 mL 12   brimonidine-timolol (COMBIGAN) 0.2-0.5 % ophthalmic solution Place 1 drop into both eyes daily.     Cholecalciferol (VITAMIN D3) 5000 units CAPS Take 5,000 Units by mouth every morning.      DULoxetine (CYMBALTA) 60 MG capsule Take 1 capsule (60 mg total) by mouth daily. 90 capsule 1   DULoxetine (CYMBALTA) 60 MG capsule Take 1 capsule (60 mg total) by mouth daily. 90 capsule 1   gabapentin (NEURONTIN) 100 MG capsule Take 1 capsule (100 mg total) by mouth 3 (three) times daily. 21 capsule 0   levocetirizine (XYZAL) 5 MG tablet Take 1 tablet (5 mg total) by mouth in the morning as needed for allergies. 90 tablet 0    lisdexamfetamine (VYVANSE) 40 MG capsule Take 1 capsule (40 mg total) by mouth every morning. 90 capsule 0   lisdexamfetamine (VYVANSE) 40 MG capsule Take 1 capsule (40 mg total) by mouth in the morning. 90 capsule 0   lisdexamfetamine (VYVANSE) 40 MG capsule Take 1 capsule (40 mg total) by mouth in the morning. 90 capsule 0   lisdexamfetamine (VYVANSE) 40 MG capsule Take 1 capsule (40 mg total) by mouth in the morning. 30 capsule 0   lisdexamfetamine (VYVANSE) 60 MG capsule Take 60 mg by mouth in the morning.     Magnesium 250 MG TABS Take 250 mg by mouth daily.     methocarbamol (ROBAXIN) 750 MG tablet Take 1 tablet (750 mg total) by mouth every 8 (eight) hours. 21 tablet 0   methylphenidate (RITALIN) 10 MG tablet Take 1 tablet (10  mg total) by mouth daily. 30 tablet 0   methylphenidate (RITALIN) 10 MG tablet Take 1 tablet (10 mg total) by mouth daily. 30 tablet 0   metoCLOPramide (REGLAN) 10 MG tablet Take 1 tablet (10 mg total) by mouth every 8 (eight) hours as needed for nausea. 15 tablet 0   montelukast (SINGULAIR) 10 MG tablet Take 1 tablet (10 mg total) by mouth every evening. 90 tablet 1   montelukast (SINGULAIR) 10 MG tablet Take 1 tablet (10 mg total) by mouth every evening. 90 tablet 1   ondansetron (ZOFRAN) 4 MG tablet Take 1 tablet (4 mg total) by mouth every 8 (eight) hours as needed for nausea 30 tablet 2   ondansetron (ZOFRAN) 4 MG tablet Take 1 tablet (4 mg total) by mouth every 8 (eight) hours as needed for nausea 30 tablet 2   ondansetron (ZOFRAN) 8 MG tablet Take 1/2 to 1 tablet by mouth every 8 hours as needed for nausea 20 tablet 0   ONE TOUCH ULTRA TEST test strip USE TO CHECK BLOOD SUGAR ONCE DAILY. ALTERNATING MORNINGS AND EVENINGS BEFORE MEALS  3   oxybutynin (DITROPAN) 5 MG tablet Take 1 tablet (5 mg total) by mouth every 8 (eight) hours as needed for urinary incontinence 270 tablet 1   pneumococcal 13-valent conjugate vaccine (PREVNAR 13) SUSP injection Inject 0.5 mLs  into the muscle once for 1 dose 0.5 mL 0   polyethylene glycol-electrolytes (NULYTELY) 420 g solution Take as directed 4000 mL 0   prednisoLONE acetate (PRED FORTE) 1 % ophthalmic suspension Place 1 drop into both eyes in the morning and at bedtime.     promethazine-dextromethorphan (PROMETHAZINE-DM) 6.25-15 MG/5ML syrup Take 5 mLs by mouth 4 (four) times daily as needed for cough. 240 mL 0   Semaglutide, 1 MG/DOSE, (OZEMPIC, 1 MG/DOSE,) 4 MG/3ML SOPN Inject 1 mg into the skin once a week. 9 mL 1   tiZANidine (ZANAFLEX) 2 MG tablet Take 1 tablet (2 mg total) by mouth every 8 (eight) hours as needed for muscle spasms. 15 tablet 0   Topiramate ER (TROKENDI XR) 200 MG CP24 Take 1 capsule by mouth at bedtime. 90 capsule 3   No facility-administered medications prior to visit.    PAST MEDICAL HISTORY: Past Medical History:  Diagnosis Date   Allergy    Asthma    Depression    Diabetes mellitus    diet control   Glaucoma    Obesity    Pseudotumor cerebri    Sarcoidosis 2012    PAST SURGICAL HISTORY: Past Surgical History:  Procedure Laterality Date   ABDOMINAL HYSTERECTOMY     BIOPSY  06/21/2021   Procedure: BIOPSY;  Surgeon: Ronnette Juniper, MD;  Location: [facility];  Service: Gastroenterology;;   CARDIAC CATHETERIZATION     CHOLECYSTECTOMY     COLONOSCOPY WITH PROPOFOL N/A 06/21/2021   Procedure: COLONOSCOPY WITH PROPOFOL;  Surgeon: Ronnette Juniper, MD;  Location: [facility];  Service: Gastroenterology;  Laterality: N/A;   EYE SURGERY Left    Removed vitreous membrane   glaucoma shunt     INCISION AND DRAINAGE Right 05/01/2014   Procedure: INCISION AND DRAINAGE RIGHT INDEX FLEXOR SHEATH  ;  Surgeon: Charlotte Crumb, MD;  Location: [place];  Service: Orthopedics;  Laterality: Right;   left eye surgery      FAMILY HISTORY: Family History  Problem Relation Age of Onset   Alcoholism Mother    Cancer Maternal Grandmother    Diabetes Paternal Grandmother  Healthy  Daughter     SOCIAL HISTORY: Social History   Socioeconomic History   Marital status: Single    Spouse name: Not on file   Number of children: 1   Years of education: Not on file   Highest education level: Not on file  Occupational History   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   She lives alone.  She has one grown daughter (81).   Social Determinants of Health   Financial Resource Strain: Not on file  Food Insecurity: Not on file  Transportation Needs: Not on file  Physical Activity: Not on file  Stress: Not on file  Social Connections: Not on file  Intimate Partner Violence: Not on file     PHYSICAL EXAM   GENERAL EXAM/CONSTITUTIONAL: Vitals:  There were no vitals filed for this visit.   There is no height or weight on file to calculate BMI. Wt Readings from Last 3 Encounters:  01/04/22 286 lb 6.4 oz (129.9 kg)  06/21/21 286 lb (129.7 kg)  05/09/21 280 lb (127 kg)   Patient is in no distress; well developed, nourished and groomed; neck is supple. Obese patient   EYES: Pupils round and reactive to light, Visual fields full to confrontation, Extraocular movements intacts,   MUSCULOSKELETAL: Gait, strength, tone, movements noted in Neurologic exam below  NEUROLOGIC: MENTAL STATUS:      No data to display         awake, alert, oriented to person, place and time recent and remote memory intact normal attention and concentration language fluent, comprehension intact, naming intact fund of knowledge  appropriate  CRANIAL NERVE:  2nd, 3rd, 4th, 6th - pupils equal and reactive to light, visual fields full to confrontation, extraocular muscles intact, no nystagmus 5th - facial sensation symmetric 7th - facial strength symmetric 8th - hearing intact 9th - palate elevates symmetrically, uvula midline 11th - shoulder shrug symmetric 12th - tongue protrusion midline  MOTOR:  normal bulk and tone, full strength in the BUE, BLE  SENSORY:  normal and symmetric to light touch, pinprick, temperature, vibration  COORDINATION:  finger-nose-finger, fine finger movements normal  REFLEXES:  deep tendon reflexes present and symmetric  GAIT/STATION:  normal   DIAGNOSTIC DATA (LABS, IMAGING, TESTING) - I reviewed patient records, labs, notes, testing and imaging myself where available.  Lab Results  Component Value Date   WBC 7.6 03/16/2021   HGB 10.9 (L) 03/16/2021   HCT 32.6 (L) 03/16/2021   MCV 85.3 03/16/2021   PLT 346 03/16/2021      Component Value Date/Time   NA 136 03/16/2021 1353   K 3.4 (L) 03/16/2021 1353   CL 106 03/16/2021 1353   CO2 21 (L) 03/16/2021 1353   GLUCOSE 103 (H) 03/16/2021 1353   BUN 7 03/16/2021 1353   CREATININE 0.94 03/16/2021 1353   CREATININE 1.03 07/16/2017 1126   CALCIUM 8.5 (L) 03/16/2021 1353   PROT 7.3 03/16/2021 1353   ALBUMIN 3.6 03/16/2021 1353   AST 24  03/16/2021 1353   ALT 16 03/16/2021 1353   ALKPHOS 74 03/16/2021 1353   BILITOT 0.2 (L) 03/16/2021 1353   GFRNONAA >60 03/16/2021 1353   GFRNONAA 76 05/26/2013 1840   GFRAA >60 02/11/2018 2052   GFRAA 88 05/26/2013 1840   Lab Results  Component Value Date   CHOL  07/05/2009    124        ATP III CLASSIFICATION:  <200     mg/dL   Desirable  200-239  mg/dL   Borderline High  >=240    mg/dL   High          HDL 37 (L) 07/05/2009   LDLCALC  07/05/2009    61        Total Cholesterol/HDL:CHD Risk Coronary Heart Disease Risk Table                     Men   Women  1/2 Average Risk    3.4   3.3  Average Risk       5.0   4.4  2 X Average Risk   9.6   7.1  3 X Average Risk  23.4   11.0        Use the calculated Patient Ratio above and the CHD Risk Table to determine the patient's CHD Risk.        ATP III CLASSIFICATION (LDL):  <100     mg/dL   Optimal  100-129  mg/dL   Near or Above                    Optimal  130-159  mg/dL   Borderline  160-189  mg/dL   High  >190     mg/dL   Very High   TRIG 130 07/05/2009   CHOLHDL 3.4 07/05/2009   Lab Results  Component Value Date   HGBA1C (H) 07/05/2009    6.7 (NOTE) The ADA recommends the following therapeutic goal for glycemic control related to Hgb A1c measurement: Goal of therapy: <6.5 Hgb A1c  Reference: American Diabetes Association: Clinical Practice Recommendations 2010, Diabetes Care, 2010, 33: (Suppl  1).   Lab Results  Component Value Date   GHWEXHBZ16 967 07/16/2017   Lab Results  Component Value Date   TSH  07/05/2009    1.103 (NOTE) Please note change in reference ranges for ages 45W to 81Y. Test methodology is 3rd generation TSH    Head CT 2022: No acute intracranial abnormalities. Empty Sella   MRI Brain 2019 1.  No acute intracranial abnormality. 2. No abnormal intracranial enhancement or dural thickening to suggest neurosarcoidosis. Mild nonspecific subcortical cerebral white matter signal changes may have progressed since 2015. 3. Chronic partially empty sella and prominence of the optic nerve root sleeves again compatible with idiopathic intracranial hypertension (pseudotumor cerebri). 4. Mild bilateral maxillary sinus inflammation with small fluid levels is new since 2018.   I personally review the Brain Images.     ASSESSMENT AND PLAN  47 y.o. year old female with past medical history of idiopathic intracranial hypertension, obesity, diabetes mellitus, sarcoidosis, bipolar disorder and sleep apnea who is presenting for follow-up for her idiopathic intracranial hypertension.  Symptoms  well-controlled on Diamox 500 mg twice daily and Trokendi 200 mg at nighttime.    She reports improvement of her headaches, she still get them but they are very mild, she does not take any abortive medications.  She reports satisfaction with her current frequency.   In terms  of her sleep apnea, she has not follow-up with her sleep doctors, currently does not does not use her CPAP machine.  I spent a lot of time discussing with patient importance of compliance with the CPAP machine, that some of the headache that she is having may be actually related to sleep apnea and not to IIH.  I did advise her to follow-up with her doctors, try to get a better mask.   In terms of her weight, she reports exercising, walking but will usually have leg heaviness after 10 to 15-minutes and she has to take a break.  I encourage patient to continue exercise even though she is having heaviness, I have explained to her that this is most likely signs of deconditioning.  She reported being approved for weight loss surgery but she is little hesitant due to her history of bipolar disorder.  Her bipolar disorder has been managed by her primary care doctor, I did advise her to follow-up with primary care doctor to obtain a referral to a therapist or psychologist to discuss about her disease and also to discuss about the weight loss surgery.  She will follow-up in 1 year with the nurse practitioner, likely Butler Denmark, I have provided her 1 year supply of medications.  She also knows to contact me if she has any questions or concerns.    No diagnosis found.    There are no Patient Instructions on file for this visit.     No orders of the defined types were placed in this encounter.    No orders of the defined types were placed in this encounter.   No follow-ups on file.   I spent *** minutes of face-to-face and non-face-to-face time with patient.  This included previsit chart review, lab review, study review, order  entry, electronic health record documentation, patient education  Frann Rider, [facility]  [facility] [address] [place], [place] [address]  Phone [phone] Fax [phone] Note: This document was prepared with digital dictation and possible smart phrase technology. Any transcriptional errors that result from this process are unintentional.

## 2022-05-09 ENCOUNTER — Ambulatory Visit: Payer: Commercial Managed Care - PPO | Admitting: Adult Health

## 2022-05-09 ENCOUNTER — Encounter: Payer: Self-pay | Admitting: Adult Health

## 2022-05-09 ENCOUNTER — Other Ambulatory Visit (HOSPITAL_COMMUNITY): Payer: Self-pay

## 2022-05-09 VITALS — BP 123/76 | HR 79 | Ht 61.0 in | Wt 288.2 lb

## 2022-05-09 DIAGNOSIS — G932 Benign intracranial hypertension: Secondary | ICD-10-CM

## 2022-05-09 MED ORDER — PREDNISOLONE ACETATE 1 % OP SUSP
1.0000 [drp] | Freq: Four times a day (QID) | OPHTHALMIC | 1 refills | Status: DC
  Filled 2022-05-09: qty 5, 10d supply, fill #0

## 2022-05-09 MED ORDER — ACETAZOLAMIDE ER 500 MG PO CP12
500.0000 mg | ORAL_CAPSULE | Freq: Two times a day (BID) | ORAL | 3 refills | Status: DC
  Filled 2022-05-09 – 2022-10-02 (×2): qty 180, 90d supply, fill #0

## 2022-05-09 NOTE — Patient Instructions (Signed)
Your Plan:  Continue Diamox and topiramate at same dosages  Continue to follow routinely with ophthalmology    Follow-up in 1 year or call earlier if needed     Thank you for coming to see [place] at [facility]. I hope we have been able to provide you high quality care today.  You may receive a patient satisfaction survey over the next few weeks. We would appreciate your feedback and comments so that we may continue to improve ourselves and the health of our patients.

## 2022-05-31 ENCOUNTER — Other Ambulatory Visit (HOSPITAL_COMMUNITY): Payer: Self-pay

## 2022-05-31 MED ORDER — LISDEXAMFETAMINE DIMESYLATE 40 MG PO CAPS
40.0000 mg | ORAL_CAPSULE | Freq: Every morning | ORAL | 0 refills | Status: DC
  Filled 2022-05-31: qty 30, 30d supply, fill #0

## 2022-06-07 ENCOUNTER — Other Ambulatory Visit (HOSPITAL_COMMUNITY): Payer: Self-pay

## 2022-06-09 ENCOUNTER — Other Ambulatory Visit (HOSPITAL_COMMUNITY): Payer: Self-pay

## 2022-06-09 MED ORDER — BRIMONIDINE TARTRATE 0.2 % OP SOLN
1.0000 [drp] | Freq: Three times a day (TID) | OPHTHALMIC | 12 refills | Status: DC
  Filled 2022-06-09: qty 5, 34d supply, fill #0

## 2022-06-09 MED ORDER — DORZOLAMIDE HCL-TIMOLOL MAL 2-0.5 % OP SOLN
1.0000 [drp] | Freq: Two times a day (BID) | OPHTHALMIC | 6 refills | Status: DC
  Filled 2022-06-09: qty 10, 50d supply, fill #0

## 2022-06-20 ENCOUNTER — Other Ambulatory Visit (HOSPITAL_COMMUNITY): Payer: Self-pay

## 2022-06-20 HISTORY — PX: CATARACT EXTRACTION: SUR2

## 2022-06-20 MED ORDER — PREDNISOLONE ACETATE 1 % OP SUSP
1.0000 [drp] | Freq: Four times a day (QID) | OPHTHALMIC | 1 refills | Status: DC
  Filled 2022-06-20: qty 5, 7d supply, fill #0
  Filled 2022-07-12: qty 5, 7d supply, fill #1

## 2022-06-20 MED ORDER — CIPROFLOXACIN HCL 0.3 % OP SOLN
1.0000 [drp] | Freq: Four times a day (QID) | OPHTHALMIC | 0 refills | Status: DC
  Filled 2022-06-20: qty 5, 7d supply, fill #0

## 2022-06-21 ENCOUNTER — Other Ambulatory Visit (HOSPITAL_COMMUNITY): Payer: Self-pay

## 2022-06-21 MED ORDER — ERYTHROMYCIN 5 MG/GM OP OINT
TOPICAL_OINTMENT | OPHTHALMIC | 6 refills | Status: DC
  Filled 2022-06-21: qty 3.5, 7d supply, fill #0

## 2022-06-29 ENCOUNTER — Other Ambulatory Visit (HOSPITAL_COMMUNITY): Payer: Self-pay

## 2022-06-29 MED ORDER — LISDEXAMFETAMINE DIMESYLATE 40 MG PO CAPS
40.0000 mg | ORAL_CAPSULE | Freq: Every morning | ORAL | 0 refills | Status: DC
  Filled 2022-07-12: qty 30, 30d supply, fill #0

## 2022-06-29 MED ORDER — OZEMPIC (2 MG/DOSE) 8 MG/3ML ~~LOC~~ SOPN
2.0000 mg | PEN_INJECTOR | SUBCUTANEOUS | 0 refills | Status: DC
  Filled 2022-06-29: qty 3, 28d supply, fill #0

## 2022-07-12 ENCOUNTER — Other Ambulatory Visit: Payer: Self-pay

## 2022-07-12 ENCOUNTER — Other Ambulatory Visit (HOSPITAL_COMMUNITY): Payer: Self-pay

## 2022-07-14 ENCOUNTER — Other Ambulatory Visit (HOSPITAL_COMMUNITY): Payer: Self-pay

## 2022-07-14 MED ORDER — BRIMONIDINE TARTRATE 0.2 % OP SOLN
1.0000 [drp] | Freq: Two times a day (BID) | OPHTHALMIC | 3 refills | Status: AC
  Filled 2022-07-14: qty 10, 50d supply, fill #0
  Filled 2022-10-02: qty 10, 50d supply, fill #1
  Filled 2023-03-16: qty 10, 50d supply, fill #2

## 2022-07-21 ENCOUNTER — Other Ambulatory Visit (HOSPITAL_COMMUNITY): Payer: Self-pay

## 2022-07-21 MED ORDER — ARMODAFINIL 250 MG PO TABS
250.0000 mg | ORAL_TABLET | Freq: Every day | ORAL | 2 refills | Status: DC
  Filled 2022-07-21 (×2): qty 30, 30d supply, fill #0
  Filled 2022-08-18 – 2022-09-05 (×2): qty 30, 30d supply, fill #1
  Filled 2022-10-02: qty 30, 30d supply, fill #2

## 2022-07-27 ENCOUNTER — Encounter (HOSPITAL_COMMUNITY): Payer: Self-pay

## 2022-07-27 ENCOUNTER — Other Ambulatory Visit (HOSPITAL_COMMUNITY): Payer: Self-pay

## 2022-07-27 MED ORDER — LISDEXAMFETAMINE DIMESYLATE 40 MG PO CAPS
40.0000 mg | ORAL_CAPSULE | Freq: Every morning | ORAL | 0 refills | Status: DC
  Filled 2022-07-27 – 2022-08-11 (×3): qty 30, 30d supply, fill #0

## 2022-07-27 MED ORDER — OZEMPIC (2 MG/DOSE) 8 MG/3ML ~~LOC~~ SOPN
2.0000 mg | PEN_INJECTOR | SUBCUTANEOUS | 1 refills | Status: DC
  Filled 2022-07-27 (×2): qty 3, 28d supply, fill #0
  Filled 2022-09-04: qty 3, 28d supply, fill #1

## 2022-07-28 ENCOUNTER — Other Ambulatory Visit (HOSPITAL_COMMUNITY): Payer: Self-pay

## 2022-07-31 ENCOUNTER — Other Ambulatory Visit (HOSPITAL_COMMUNITY): Payer: Self-pay

## 2022-07-31 MED ORDER — LEVOCETIRIZINE DIHYDROCHLORIDE 5 MG PO TABS
5.0000 mg | ORAL_TABLET | Freq: Every morning | ORAL | 0 refills | Status: DC
  Filled 2022-07-31: qty 90, 90d supply, fill #0

## 2022-07-31 MED ORDER — PREDNISOLONE ACETATE 1 % OP SUSP
1.0000 [drp] | Freq: Four times a day (QID) | OPHTHALMIC | 1 refills | Status: DC
  Filled 2022-07-31: qty 5, 25d supply, fill #0

## 2022-07-31 MED ORDER — ARIPIPRAZOLE 5 MG PO TABS
5.0000 mg | ORAL_TABLET | Freq: Every day | ORAL | 0 refills | Status: DC
  Filled 2022-07-31: qty 90, 90d supply, fill #0

## 2022-08-11 ENCOUNTER — Other Ambulatory Visit (HOSPITAL_COMMUNITY): Payer: Self-pay

## 2022-08-11 MED ORDER — ATROPINE SULFATE 1 % OP SOLN
1.0000 [drp] | Freq: Three times a day (TID) | OPHTHALMIC | 2 refills | Status: DC
  Filled 2022-08-11: qty 5, 30d supply, fill #0

## 2022-08-11 MED ORDER — PREDNISOLONE ACETATE 1 % OP SUSP
1.0000 [drp] | Freq: Four times a day (QID) | OPHTHALMIC | 6 refills | Status: DC
  Filled 2022-08-11: qty 5, 25d supply, fill #0

## 2022-08-22 ENCOUNTER — Other Ambulatory Visit: Payer: Self-pay

## 2022-08-22 ENCOUNTER — Emergency Department (HOSPITAL_BASED_OUTPATIENT_CLINIC_OR_DEPARTMENT_OTHER)
Admission: EM | Admit: 2022-08-22 | Discharge: 2022-08-22 | Disposition: A | Discharge status: Home / Self Care | Payer: BC Managed Care – PPO | Attending: Emergency Medicine | Admitting: Emergency Medicine

## 2022-08-22 ENCOUNTER — Other Ambulatory Visit (HOSPITAL_BASED_OUTPATIENT_CLINIC_OR_DEPARTMENT_OTHER): Payer: Self-pay

## 2022-08-22 ENCOUNTER — Encounter (HOSPITAL_BASED_OUTPATIENT_CLINIC_OR_DEPARTMENT_OTHER): Payer: Self-pay

## 2022-08-22 ENCOUNTER — Emergency Department (HOSPITAL_BASED_OUTPATIENT_CLINIC_OR_DEPARTMENT_OTHER): Payer: BC Managed Care – PPO | Admitting: Radiology

## 2022-08-22 DIAGNOSIS — E876 Hypokalemia: Secondary | ICD-10-CM | POA: Insufficient documentation

## 2022-08-22 DIAGNOSIS — R079 Chest pain, unspecified: Secondary | ICD-10-CM

## 2022-08-22 DIAGNOSIS — E119 Type 2 diabetes mellitus without complications: Secondary | ICD-10-CM | POA: Insufficient documentation

## 2022-08-22 DIAGNOSIS — J45909 Unspecified asthma, uncomplicated: Secondary | ICD-10-CM | POA: Insufficient documentation

## 2022-08-22 DIAGNOSIS — R0789 Other chest pain: Secondary | ICD-10-CM | POA: Insufficient documentation

## 2022-08-22 LAB — CBC
HCT: 38.1 % (ref 36.0–46.0)
Hemoglobin: 12.3 g/dL (ref 12.0–15.0)
MCH: 28.4 pg (ref 26.0–34.0)
MCHC: 32.3 g/dL (ref 30.0–36.0)
MCV: 88 fL (ref 80.0–100.0)
Platelets: 466 10*3/uL — ABNORMAL HIGH (ref 150–400)
RBC: 4.33 MIL/uL (ref 3.87–5.11)
RDW: 16.1 % — ABNORMAL HIGH (ref 11.5–15.5)
WBC: 9.5 10*3/uL (ref 4.0–10.5)
nRBC: 0 % (ref 0.0–0.2)

## 2022-08-22 LAB — BASIC METABOLIC PANEL
Anion gap: 12 (ref 5–15)
BUN: 9 mg/dL (ref 6–20)
CO2: 23 mmol/L (ref 22–32)
Calcium: 9.5 mg/dL (ref 8.9–10.3)
Chloride: 105 mmol/L (ref 98–111)
Creatinine, Ser: 0.82 mg/dL (ref 0.44–1.00)
GFR, Estimated: >60 mL/min (ref >60)
Glucose, Bld: 107 mg/dL — ABNORMAL HIGH (ref 70–99)
Potassium: 3.4 mmol/L — ABNORMAL LOW (ref 3.5–5.1)
Sodium: 140 mmol/L (ref 135–145)

## 2022-08-22 LAB — TROPONIN I (HIGH SENSITIVITY)
Troponin I (High Sensitivity): <2 ng/L (ref <18)
Troponin I (High Sensitivity): 2 ng/L (ref <18)

## 2022-08-22 MED ORDER — POTASSIUM CHLORIDE CRYS ER 20 MEQ PO TBCR
20.0000 meq | EXTENDED_RELEASE_TABLET | Freq: Once | ORAL | Status: AC
  Administered 2022-08-22: 20 meq via ORAL
  Filled 2022-08-22: qty 1

## 2022-08-22 NOTE — ED Triage Notes (Signed)
Patient here POV from Home.  Endorses CP, mostly Left Sided and radiates to left Jaw since Last PM. Intermittent since. Some Nausea. No SOB. No Emesis.   NAD Noted during Triage. A&Ox4. GCS 15. Ambulatory.

## 2022-08-22 NOTE — Discharge Instructions (Signed)
You were seen in the ER today for evaluation of your chest pain.  Your EKG is unremarkable.  Your lab work shows that your potassium was mildly low which we replenish this for you today.  I make sure to follow-up with your primary care doctor for reevaluation and further checking of this to make sure that the level stays normal.  I have sent in an [facility] referral for a cardiologist.  They should reach out to the next few days, however if they have not, please make sure to call them schedule an appointment.  In the meantime, please make sure you are to follow-up with your primary care doctor for reevaluation of your problems.  If you have any concerns of any worsening symptoms, please return to the nearest emergency department for evaluation.  Contact a doctor if: Your chest pain does not go away. You feel depressed. You have a fever. Get help right away if: Your chest pain is worse. You have a cough that gets worse, or you cough up blood. You have very bad (severe) pain in your belly (abdomen). You pass out (faint). You have either of these for no clear reason: Sudden chest discomfort. Sudden discomfort in your arms, back, neck, or jaw. You have shortness of breath at any time. You suddenly start to sweat, or your skin gets clammy. You feel sick to your stomach (nauseous). You throw up (vomit). You suddenly feel lightheaded or dizzy. You feel very weak or tired. Your heart starts to beat fast, or it feels like it is skipping beats. These symptoms may be an emergency. Do not wait to see if the symptoms will go away. Get medical help right away. Call your local emergency services (911 in the [place] Do not drive yourself to the hospital.

## 2022-08-22 NOTE — ED Notes (Signed)
Pt verbalized understanding of d/c instructions, meds, and followup care. Denies questions. VSS, no distress noted. Steady gait to exit with all belongings.  ?

## 2022-08-22 NOTE — ED Provider Notes (Signed)
[name] EMERGENCY DEPARTMENT AT [facility] Provider Note   CSN: [id number] Arrival date & time: 08/22/22  1153     History Chief Complaint  Patient presents with   Chest Pain    Denise Cook is a 47 y.o. female.   Chest Pain      Home Medications Prior to Admission medications   Medication Sig Start Date End Date Taking? Authorizing Provider  acetaminophen (TYLENOL) 500 MG tablet Take 1,000 mg by mouth every 6 (six) hours as needed for headache. Reported on 07/06/2015    [provider]  acetaZOLAMIDE ER (DIAMOX) 500 MG capsule Take 1 capsule (500 mg total) by mouth 2 (two) times daily. 05/09/22   Ihor Austin, NP  albuterol (VENTOLIN HFA) 108 (90 Base) MCG/ACT inhaler Inhale 1-2 puffs into the lungs every 4 (four) hours as needed for wheezing 03/15/21     ARIPiprazole (ABILIFY) 5 MG tablet Take 1 tablet (5 mg total) by mouth at bedtime. 07/31/22     Armodafinil 250 MG tablet Take 1 tablet (250 mg total) by mouth daily. 09/28/21     Armodafinil 250 MG tablet Take 1 tablet (250 mg total) by mouth daily. 07/20/22     atorvastatin (LIPITOR) 10 MG tablet Take 1 tablet (10 mg total) by mouth once a week. 09/16/21     atropine 1 % ophthalmic solution Place 1 drop into the left eye 3 (three) times daily. 08/11/22     azaTHIOprine (IMURAN) 50 MG tablet Take 4 tablets (200 mg total) by mouth daily. 06/24/21     betamethasone dipropionate 0.05 % cream Apply topically to affected area up to 2 (two) times daily as needed. (Not to face, groin, axilla) 11/01/21   Nita Sells, MD  Blood Glucose Monitoring Suppl (FIFTY50 GLUCOSE METER 2.0) w/Device KIT USE TO CHECK BLOOD SUGAR ONCE DAILY. ALTERNATING MORNINGS AND EVENINGS BEFORE MEALS 08/20/17   [provider]  brimonidine (ALPHAGAN) 0.2 % ophthalmic solution Place 1 drop into both eyes 2 (two) times daily. 07/14/22     brimonidine-timolol (COMBIGAN) 0.2-0.5 % ophthalmic solution Place 1 drop into both eyes daily. 09/20/18    [provider]  Cholecalciferol (VITAMIN D3) 5000 units CAPS Take 5,000 Units by mouth every morning.     [provider]  ciprofloxacin (CILOXAN) 0.3 % ophthalmic solution Place 1 drop into the left eye 4 (four) times daily for 10 days 06/20/22     erythromycin ophthalmic ointment Place into the left eye at bedtime 06/21/22     levocetirizine (XYZAL) 5 MG tablet Take 1 tablet (5 mg total) by mouth every morning as needed for allergies 07/31/22     lisdexamfetamine (VYVANSE) 40 MG capsule Take 1 capsule (40 mg total) by mouth in the morning. 07/26/22     ondansetron (ZOFRAN) 4 MG tablet Take 1 tablet (4 mg total) by mouth every 8 (eight) hours as needed for nausea 03/15/21     prednisoLONE acetate (PRED FORTE) 1 % ophthalmic suspension Place 1 drop into the left eye 4 (four) times daily. 08/11/22     Semaglutide, 1 MG/DOSE, (OZEMPIC, 1 MG/DOSE,) 4 MG/3ML SOPN Inject 1 mg into the skin once a week. Patient not taking: Reported on 08/22/2022 10/03/21     Semaglutide, 2 MG/DOSE, (OZEMPIC, 2 MG/DOSE,) 8 MG/3ML SOPN Inject 2 mg into the skin once a week. 07/26/22     Topiramate ER (TROKENDI XR) 200 MG CP24 Take 1 capsule by mouth at bedtime. 02/22/22   Windell Norfolk, MD  Allergies    Methotrexate, Other, and Metformin and related    Review of Systems   Review of Systems  Cardiovascular:  Positive for chest pain.    Physical Exam Updated Vital Signs BP (!) 128/93   Pulse 87   Temp (!) 97.1 F (36.2 C) (Temporal)   Resp 17   Ht 5\' 1"  (1.549 m)   Wt 123.8 kg   SpO2 100%   BMI 51.58 kg/m  Physical Exam  ED Results / Procedures / Treatments   Labs (all labs ordered are listed, but only abnormal results are displayed) Labs Reviewed  BASIC METABOLIC PANEL - Abnormal; Notable for the following components:      Result Value   Potassium 3.4 (*)    Glucose, Bld 107 (*)    All other components within normal limits  CBC - Abnormal; Notable for the following components:    RDW 16.1 (*)    Platelets 466 (*)    All other components within normal limits  TROPONIN I (HIGH SENSITIVITY)  TROPONIN I (HIGH SENSITIVITY)    EKG EKG Interpretation  Date/Time:  Tuesday Aug 22 2022 12:03:44 EDT Ventricular Rate:  75 PR Interval:  172 QRS Duration: 78 QT Interval:  390 QTC Calculation: 435 R Axis:   11 Text Interpretation: Normal sinus rhythm Normal ECG When compared with ECG of 07-Oct-2016 18:52, No significant change since last tracing Confirmed by Alvira Monday ([id number]) on 08/22/2022 1:04:03 PM  Radiology DG Chest 2 View  Result Date: 08/22/2022 CLINICAL DATA:  Left-sided chest pain radiating to jaw. EXAM: CHEST - 2 VIEW COMPARISON:  01/04/2022 FINDINGS: The heart size and mediastinal contours are within normal limits. There is no evidence of pulmonary edema, consolidation, pneumothorax, nodule or pleural fluid. The visualized skeletal structures are unremarkable. IMPRESSION: No active cardiopulmonary disease. Electronically Signed   By: Irish Lack M.D.   On: 08/22/2022 12:26    Procedures Procedures  {Document cardiac monitor, telemetry assessment procedure when appropriate:1}  Medications Ordered in ED Medications - No data to display  ED Course/ Medical Decision Making/ A&P   {   Click here for ABCD2, HEART and other calculatorsREFRESH Note before signing :1}                          Medical Decision Making Amount and/or Complexity of Data Reviewed Labs: ordered. Radiology: ordered.   ***  {Document critical care time when appropriate:1} {Document review of labs and clinical decision tools ie heart score, Chads2Vasc2 etc:1}  {Document your independent review of radiology images, and any outside records:1} {Document your discussion with family members, caretakers, and with consultants:1} {Document social determinants of health affecting pt's care:1} {Document your decision making why or why not admission, treatments were needed:1} Final  Clinical Impression(s) / ED Diagnoses Final diagnoses:  None    Rx / DC Orders ED Discharge Orders     None

## 2022-08-23 ENCOUNTER — Other Ambulatory Visit: Payer: Self-pay

## 2022-08-26 ENCOUNTER — Other Ambulatory Visit (HOSPITAL_COMMUNITY): Payer: Self-pay

## 2022-08-31 ENCOUNTER — Other Ambulatory Visit: Payer: Self-pay

## 2022-09-04 ENCOUNTER — Other Ambulatory Visit (HOSPITAL_COMMUNITY): Payer: Self-pay

## 2022-09-05 ENCOUNTER — Other Ambulatory Visit (HOSPITAL_COMMUNITY): Payer: Self-pay

## 2022-09-07 ENCOUNTER — Other Ambulatory Visit (HOSPITAL_COMMUNITY): Payer: Self-pay

## 2022-09-07 ENCOUNTER — Ambulatory Visit: Payer: BC Managed Care – PPO | Admitting: Cardiology

## 2022-09-07 ENCOUNTER — Encounter: Payer: Self-pay | Admitting: Cardiology

## 2022-09-07 VITALS — BP 128/91 | HR 95 | Ht 61.0 in | Wt 276.0 lb

## 2022-09-07 DIAGNOSIS — I209 Angina pectoris, unspecified: Secondary | ICD-10-CM

## 2022-09-07 DIAGNOSIS — E785 Hyperlipidemia, unspecified: Secondary | ICD-10-CM

## 2022-09-07 DIAGNOSIS — I10 Essential (primary) hypertension: Secondary | ICD-10-CM

## 2022-09-07 DIAGNOSIS — R0609 Other forms of dyspnea: Secondary | ICD-10-CM

## 2022-09-07 MED ORDER — ATORVASTATIN CALCIUM 10 MG PO TABS
5.0000 mg | ORAL_TABLET | Freq: Every day | ORAL | 1 refills | Status: DC
Start: 2022-09-07 — End: 2023-06-05
  Filled 2022-09-07: qty 45, 90d supply, fill #0
  Filled 2023-03-16: qty 45, 90d supply, fill #1

## 2022-09-07 MED ORDER — VITAMIN D3 50 MCG (2000 UT) PO CAPS: 2000.0000 [IU] | ORAL_CAPSULE | Freq: Every day | ORAL | Status: DC

## 2022-09-07 MED ORDER — LISINOPRIL-HYDROCHLOROTHIAZIDE 20-12.5 MG PO TABS
1.0000 | ORAL_TABLET | ORAL | 2 refills | Status: DC
Start: 2022-09-07 — End: 2023-01-24
  Filled 2022-09-07: qty 30, 30d supply, fill #0
  Filled 2022-10-02: qty 30, 30d supply, fill #1

## 2022-09-07 MED ORDER — ASPIRIN 81 MG PO CHEW
81.0000 mg | CHEWABLE_TABLET | Freq: Every day | ORAL | Status: DC
Start: 2022-09-07 — End: 2022-10-19

## 2022-09-07 MED ORDER — AMLODIPINE BESYLATE 5 MG PO TABS
5.0000 mg | ORAL_TABLET | Freq: Every evening | ORAL | 2 refills | Status: DC
Start: 2022-09-07 — End: 2022-10-19
  Filled 2022-09-07: qty 30, 30d supply, fill #0
  Filled 2022-10-02: qty 30, 30d supply, fill #1

## 2022-09-07 NOTE — Progress Notes (Signed)
Primary Physician/Referring:  Alyson Ingles, PA-C  Patient ID: Denise Cook, female    DOB: January 08, 1976, 47 y.o.   MRN: [id number]  Chief Complaint  Patient presents with   Chest Pain   New Patient (Initial Visit)   HPI:    Denise Cook  is a 47 y.o. Patient with diabetes mellitus, vitamin D deficiency, inflammatory polyarthropathy and sarcoidosis of the skin and eyes, morbid obesity with OSA not on CPAP referred to me for evaluation of chest pain.  Patient states that over the past several months she has started noticing chest pain which she reports with 1 finger on the left side of her breast that is present on and off, with or without exertion activity.  Sometimes she feels the pain radiates to her jaw especially when she is busy and active.  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  She is in the process of going through evaluation for bariatric surgery.  On further questioning, patient has dyspnea on exertion, marked fatigue, she sleeps at 6:30 in the evening and wakes up around 5:30 in the morning and gets fatigued very easily, does not feel rested.  She is not using her CPAP as she has difficulty with wearing a mask especially in view of her problems with sarcoidosis involving the eye.  Denies PND or orthopnea, denies leg edema, no family history of premature coronary disease.  Past Medical History:  Diagnosis Date   Allergy    Asthma    Depression    Diabetes mellitus    diet control   Glaucoma    Obesity    Pseudotumor cerebri    Sarcoidosis 2012   Past Surgical History:  Procedure Laterality Date   ABDOMINAL HYSTERECTOMY     BIOPSY  06/21/2021   Procedure: BIOPSY;  Surgeon: Kerin Salen, MD;  Location: [facility];  Service: Gastroenterology;;   CARDIAC CATHETERIZATION     CHOLECYSTECTOMY     COLONOSCOPY WITH PROPOFOL N/A 06/21/2021   Procedure: COLONOSCOPY WITH PROPOFOL;  Surgeon: Kerin Salen, MD;  Location: [facility];  Service:  Gastroenterology;  Laterality: N/A;   EYE SURGERY Left    Removed vitreous membrane   glaucoma shunt     INCISION AND DRAINAGE Right 05/01/2014   Procedure: INCISION AND DRAINAGE RIGHT INDEX FLEXOR SHEATH  ;  Surgeon: Dairl Ponder, MD;  Location: [facility];  Service: Orthopedics;  Laterality: Right;   left eye surgery     Family History  Adopted: Yes  Problem Relation Age of Onset   Alcoholism Mother    Cancer Maternal Grandmother    Diabetes Paternal Grandmother    Healthy Daughter     Social History   Tobacco Use   Smoking status: Never   Smokeless tobacco: Never  Substance Use Topics   Alcohol use: No   Marital Status: Single  ROS  Review of Systems  Cardiovascular:  Positive for chest pain and dyspnea on exertion. Negative for leg swelling.   Objective      09/07/2022    3:43 PM 09/07/2022    2:56 PM 09/07/2022    2:44 PM  Vitals with BMI  Height   5\' 1"   Weight   276 lbs  BMI   52.18  Systolic 128 147 409  Diastolic 91 106 96  Pulse  95 95   Blood pressure (!) 128/91, pulse 95, height 5\' 1"  (1.549 m), weight 276 lb (125.2 kg), SpO2 97 %.  Physical Exam Constitutional:  Comments: Morbidly obese in no acute distress.  Neck:     Vascular: No carotid bruit or JVD.  Cardiovascular:     Rate and Rhythm: Normal rate and regular rhythm.     Pulses: Intact distal pulses.     Heart sounds: Normal heart sounds. No murmur heard.    No gallop.  Pulmonary:     Effort: Pulmonary effort is normal.     Breath sounds: Normal breath sounds.  Abdominal:     General: Bowel sounds are normal.     Palpations: Abdomen is soft.     Comments: Obese. Pannus present  Musculoskeletal:     Right lower leg: No edema.     Left lower leg: No edema.     Laboratory examination:   Recent Labs    08/22/22 1208  NA 140  K 3.4*  CL 105  CO2 23  GLUCOSE 107*  BUN 9  CREATININE 0.82  CALCIUM 9.5  GFRNONAA >60    Lab Results  Component Value Date    GLUCOSE 107 (H) 08/22/2022   NA 140 08/22/2022   K 3.4 (L) 08/22/2022   CL 105 08/22/2022   CO2 23 08/22/2022   BUN 9 08/22/2022   CREATININE 0.82 08/22/2022   GFRNONAA >60 08/22/2022   CALCIUM 9.5 08/22/2022   PROT 7.3 03/16/2021   ALBUMIN 3.6 03/16/2021   BILITOT 0.2 (L) 03/16/2021   ALKPHOS 74 03/16/2021   AST 24 03/16/2021   ALT 16 03/16/2021   ANIONGAP 12 08/22/2022     External labs:   Labs 04/18/2022:  Vitamin D 33.5. TSH normal at 2.73. A1c 7.1%.  Hb 12.5/HCT 38.4, platelets 401, elevated.  Normal indicis.  Serum glucose 140 mg, BUN 11, creatinine 1.12, EGFR 61 mL, potassium 4.2, LFTs normal.  Total cholesterol 159, triglycerides 159, HDL 50, LDL 82.  Non-HDL cholesterol 109.  Radiology:   Chest x-ray PA and lateral view 08/22/2022: The heart size and mediastinal contours are within normal limits. There is no evidence of pulmonary edema, consolidation, pneumothorax, nodule or pleural fluid. The visualized skeletal structures are unremarkable.  Cardiac Studies:   NA  EKG:   EKG 09/07/2022: Normal sinus rhythm at rate of 94 bpm, normal axis, incomplete right bundle branch block.  Otherwise normal EKG.    Medications and allergies   Allergies  Allergen Reactions   Methotrexate Other (See Comments)    Unknown reaction   Other Other (See Comments)    PEPPER sriracha turned tongue black    Metformin Rash   Metformin And Related Itching    Medication list   Current Outpatient Medications:    acetaminophen (TYLENOL) 500 MG tablet, Take 1,000 mg by mouth every 6 (six) hours as needed for headache. Reported on 07/06/2015, Disp: , Rfl:    acetaZOLAMIDE ER (DIAMOX) 500 MG capsule, Take 1 capsule (500 mg total) by mouth 2 (two) times daily., Disp: 180 capsule, Rfl: 3   albuterol (VENTOLIN HFA) 108 (90 Base) MCG/ACT inhaler, Inhale 1-2 puffs into the lungs every 4 (four) hours as needed for wheezing, Disp: 6.7 g, Rfl: 2   amLODipine (NORVASC) 5 MG tablet, Take 1  tablet (5 mg total) by mouth every evening., Disp: 30 tablet, Rfl: 2   ARIPiprazole (ABILIFY) 5 MG tablet, Take 1 tablet (5 mg total) by mouth at bedtime., Disp: 90 tablet, Rfl: 0   Armodafinil 250 MG tablet, Take 1 tablet (250 mg total) by mouth daily., Disp: 30 tablet, Rfl: 2   aspirin (ASPIRIN  CHILDRENS) 81 MG chewable tablet, Chew 1 tablet (81 mg total) by mouth daily., Disp: , Rfl:    atorvastatin (LIPITOR) 10 MG tablet, Take 1/2 tablet (5 mg total) by mouth daily., Disp: 45 tablet, Rfl: 1   azaTHIOprine (IMURAN) 50 MG tablet, Take 4 tablets (200 mg total) by mouth daily., Disp: 360 tablet, Rfl: 3   betamethasone dipropionate 0.05 % cream, Apply topically to affected area up to 2 (two) times daily as needed. (Not to face, groin, axilla), Disp: 45 g, Rfl: 3   Blood Glucose Monitoring Suppl (FIFTY50 GLUCOSE METER 2.0) w/Device KIT, USE TO CHECK BLOOD SUGAR ONCE DAILY. ALTERNATING MORNINGS AND EVENINGS BEFORE MEALS, Disp: , Rfl:    brimonidine (ALPHAGAN) 0.2 % ophthalmic solution, Place 1 drop into both eyes 2 (two) times daily., Disp: 30 mL, Rfl: 3   brimonidine-timolol (COMBIGAN) 0.2-0.5 % ophthalmic solution, Place 1 drop into both eyes daily., Disp: , Rfl:    budesonide-formoterol (SYMBICORT) 80-4.5 MCG/ACT inhaler, Inhale 2 puffs into the lungs 2 (two) times daily., Disp: , Rfl:    Cholecalciferol (VITAMIN D3) 50 MCG (2000 UT) capsule, Take 1 capsule (2,000 Units total) by mouth daily., Disp: , Rfl:    dorzolamide-timolol (COSOPT) 2-0.5 % ophthalmic solution, Place 1 drop into both eyes 2 (two) times daily., Disp: , Rfl:    levocetirizine (XYZAL) 5 MG tablet, Take 1 tablet (5 mg total) by mouth every morning as needed for allergies, Disp: 90 tablet, Rfl: 0   lisdexamfetamine (VYVANSE) 40 MG capsule, Take 1 capsule (40 mg total) by mouth in the morning., Disp: 30 capsule, Rfl: 0   lisinopril-hydrochlorothiazide (ZESTORETIC) 20-12.5 MG tablet, Take 1 tablet by mouth every morning., Disp: 30  tablet, Rfl: 2   ondansetron (ZOFRAN) 4 MG tablet, Take 1 tablet (4 mg total) by mouth every 8 (eight) hours as needed for nausea, Disp: 30 tablet, Rfl: 2   prednisoLONE acetate (PRED FORTE) 1 % ophthalmic suspension, Place 1 drop into the left eye 4 (four) times daily., Disp: 5 mL, Rfl: 6   Semaglutide, 2 MG/DOSE, (OZEMPIC, 2 MG/DOSE,) 8 MG/3ML SOPN, Inject 2 mg into the skin once a week., Disp: 3 mL, Rfl: 1   Topiramate ER (TROKENDI XR) 200 MG CP24, Take 1 capsule by mouth at bedtime., Disp: 90 capsule, Rfl: 3  Assessment     ICD-10-CM   1. Angina pectoris (HCC)  I20.9 EKG 12-Lead    amLODipine (NORVASC) 5 MG tablet    aspirin (ASPIRIN CHILDRENS) 81 MG chewable tablet    PCV ECHOCARDIOGRAM COMPLETE    PCV MYOCARDIAL PERFUSION WO LEXISCAN    2. Dyspnea on exertion  R06.09 PCV ECHOCARDIOGRAM COMPLETE    PCV MYOCARDIAL PERFUSION WO LEXISCAN    3. Primary hypertension  I10 lisinopril-hydrochlorothiazide (ZESTORETIC) 20-12.5 MG tablet    amLODipine (NORVASC) 5 MG tablet    Basic metabolic panel    4. Mild hyperlipidemia  E78.5 atorvastatin (LIPITOR) 10 MG tablet    5. Class 3 severe obesity due to excess calories with serious comorbidity and body mass index (BMI) of 50.0 to 59.9 in adult [facility])  E66.01    Z68.43        Orders Placed This Encounter  Procedures   Basic metabolic panel   PCV MYOCARDIAL PERFUSION WO LEXISCAN    Standing Status:   Future    Standing Expiration Date:   11/07/2022    Scheduling Instructions:     2 Day protocol   EKG 12-Lead   PCV ECHOCARDIOGRAM  COMPLETE    Standing Status:   Future    Standing Expiration Date:   09/07/2023    Meds ordered this encounter  Medications   lisinopril-hydrochlorothiazide (ZESTORETIC) 20-12.5 MG tablet    Sig: Take 1 tablet by mouth every morning.    Dispense:  30 tablet    Refill:  2   atorvastatin (LIPITOR) 10 MG tablet    Sig: Take 1/2 tablet (5 mg total) by mouth daily.    Dispense:  45 tablet    Refill:  1    amLODipine (NORVASC) 5 MG tablet    Sig: Take 1 tablet (5 mg total) by mouth every evening.    Dispense:  30 tablet    Refill:  2   aspirin (ASPIRIN CHILDRENS) 81 MG chewable tablet    Sig: Chew 1 tablet (81 mg total) by mouth daily.   Cholecalciferol (VITAMIN D3) 50 MCG (2000 UT) capsule    Sig: Take 1 capsule (2,000 Units total) by mouth daily.    Medications Discontinued During This Encounter  Medication Reason   Armodafinil 250 MG tablet Duplicate   Semaglutide, 1 MG/DOSE, (OZEMPIC, 1 MG/DOSE,) 4 MG/3ML SOPN Dose change   atorvastatin (LIPITOR) 10 MG tablet Patient Preference   atropine 1 % ophthalmic solution Completed Course   ciprofloxacin (CILOXAN) 0.3 % ophthalmic solution Completed Course   Cholecalciferol (VITAMIN D3) 5000 units CAPS Completed Course   erythromycin ophthalmic ointment Completed Course     Recommendations:   Denise Cook is a 47 y.o. Patient with diabetes mellitus, vitamin D deficiency, inflammatory polyarthropathy and sarcoidosis of the skin and eyes, morbid obesity with OSA not on CPAP referred to me for evaluation of chest pain.  1. Angina pectoris (HCC) No symptoms of exertional left-sided although focal chest pain that suggests musculoskeletal etiology is extremely difficult to delineate as she also states that breathing or pressure or turning the body in certain way does not affect the pain, pain can come at rest and also with exertion activity, sometimes feels the pain radiates to her neck all suggest angina pectoris.  She has major cardiovascular risk factors including diabetes, hypertension, morbid obesity and sleep apnea that is untreated. Patient instructed to start ASA  81mg  q daily for prophylaxis.   Her symptoms may be related to multiple factors including but not limited to atherosclerotic disease but more likely could be related to ongoing obesity hypoventilation syndrome and marked hypoxemia in presence of severe sleep apnea and lack of  treatment.  She is now been scheduled for fitting herself with a mouth guard.  - EKG 12-Lead - amLODipine (NORVASC) 5 MG tablet; Take 1 tablet (5 mg total) by mouth every evening.  Dispense: 30 tablet; Refill: 2 - aspirin (ASPIRIN CHILDRENS) 81 MG chewable tablet; Chew 1 tablet (81 mg total) by mouth daily. - PCV ECHOCARDIOGRAM COMPLETE; Future - PCV MYOCARDIAL PERFUSION WO LEXISCAN; Future  2. Dyspnea on exertion Dyspnea on exertion is related to deconditioning ongoing for several years and has remained stable.  No clinical evidence of heart failure.  No PND or orthopnea.  Patient has been scheduled for echocardiogram and nuclear stress test.  - PCV ECHOCARDIOGRAM COMPLETE; Future - PCV MYOCARDIAL PERFUSION WO LEXISCAN; Future  3. Primary hypertension Patient's blood pressure was markedly elevated upon arrival, continues to remain high, patient has been told that she has had high blood pressure many times, suspect she probably has primary hypertension and I will go ahead and initiate therapy with lisinopril HCT 20/12.5 mg along  with amlodipine 5 mg in the evening which would also be antianginal. - lisinopril-hydrochlorothiazide (ZESTORETIC) 20-12.5 MG tablet; Take 1 tablet by mouth every morning.  Dispense: 30 tablet; Refill: 2 - amLODipine (NORVASC) 5 MG tablet; Take 1 tablet (5 mg total) by mouth every evening.  Dispense: 30 tablet; Refill: 2 - Basic metabolic panel  4. Mild hyperlipidemia Her lipids were reviewed, she was on minimal dose of statin at 10 mg of atorvastatin once a week, she would frequently forget taking the medication as it was a weekly medication.  In view of diabetes mellitus, LDL goal <70, patient is only 47 years of age, I will start her on Lipitor 10 mg 1/2 tablet every day.  Office visit in 6 to 8 weeks for follow-up. - atorvastatin (LIPITOR) 10 MG tablet; Take 1/2 tablet (5 mg total) by mouth daily.  Dispense: 45 tablet; Refill: 1  Other orders -  budesonide-formoterol (SYMBICORT) 80-4.5 MCG/ACT inhaler; Inhale 2 puffs into the lungs 2 (two) times daily. - dorzolamide-timolol (COSOPT) 2-0.5 % ophthalmic solution; Place 1 drop into both eyes 2 (two) times daily. - Cholecalciferol (VITAMIN D3) 50 MCG (2000 UT) capsule; Take 1 capsule (2,000 Units total) by mouth daily.  5. Class 3 severe obesity due to excess calories with serious comorbidity and body mass index (BMI) of 50.0 to 59.9 in adult [facility])  Patient is in the process of going through bariatric surgery.  She is presently on Ozempic.  I had a very long discussion with the patient that her success for weight loss would be extremely poor if she continues to live her life the way she is.  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  By 630 she is fatigued out and asking if she comes home she goes to bed and wakes up at 5 in the morning.  Extensive discussion regarding making lifestyle changes and making a permanent dietary changes also discussed with the patient.    Yates Decamp, MD, [facility] 09/08/2022, 6:38 AM Office: [phone]

## 2022-09-18 ENCOUNTER — Encounter: Payer: Self-pay | Admitting: Cardiology

## 2022-09-18 NOTE — Telephone Encounter (Signed)
From patient.

## 2022-09-19 NOTE — Telephone Encounter (Signed)
From pt

## 2022-09-26 ENCOUNTER — Encounter: Payer: Self-pay | Admitting: Cardiology

## 2022-09-26 ENCOUNTER — Ambulatory Visit: Payer: Self-pay | Admitting: Surgery

## 2022-10-02 ENCOUNTER — Ambulatory Visit: Payer: BC Managed Care – PPO

## 2022-10-02 ENCOUNTER — Other Ambulatory Visit (HOSPITAL_COMMUNITY): Payer: Self-pay

## 2022-10-02 DIAGNOSIS — R0609 Other forms of dyspnea: Secondary | ICD-10-CM

## 2022-10-02 DIAGNOSIS — I209 Angina pectoris, unspecified: Secondary | ICD-10-CM

## 2022-10-03 ENCOUNTER — Other Ambulatory Visit (HOSPITAL_COMMUNITY): Payer: Self-pay

## 2022-10-03 ENCOUNTER — Other Ambulatory Visit: Payer: Self-pay

## 2022-10-03 MED ORDER — LISDEXAMFETAMINE DIMESYLATE 40 MG PO CAPS
40.0000 mg | ORAL_CAPSULE | Freq: Every morning | ORAL | 0 refills | Status: DC
  Filled 2022-10-03: qty 30, 30d supply, fill #0

## 2022-10-05 NOTE — Progress Notes (Signed)
Echocardiogram 10/02/2022: Study Quality: Technically difficult study (poor subcostal images, BMI 52). Normal LV systolic function with visual EF 60-65%. Left ventricle cavity is normal in size. Normal left ventricular wall thickness. Normal global wall motion. Normal diastolic filling pattern, normal LAP. Systolic anterior motion of the mitral valve. Peak LVOT gradient 5.2 mmHg.  No significant valvular heart disease. No prior study for comparison.

## 2022-10-06 ENCOUNTER — Other Ambulatory Visit (HOSPITAL_COMMUNITY): Payer: Self-pay

## 2022-10-06 ENCOUNTER — Other Ambulatory Visit: Payer: Self-pay

## 2022-10-06 MED ORDER — AZATHIOPRINE 50 MG PO TABS
200.0000 mg | ORAL_TABLET | Freq: Every day | ORAL | 3 refills | Status: AC
  Filled 2022-10-06: qty 360, 90d supply, fill #0
  Filled 2022-11-06 – 2023-03-16 (×3): qty 360, 90d supply, fill #1

## 2022-10-10 ENCOUNTER — Ambulatory Visit: Payer: BC Managed Care – PPO | Admitting: Internal Medicine

## 2022-10-12 ENCOUNTER — Other Ambulatory Visit: Payer: BC Managed Care – PPO

## 2022-10-12 NOTE — Progress Notes (Signed)
Exercise nuclear stress test 10/12/2022: Myocardial perfusion is normal. Overall LV systolic function is normal without regional wall motion abnormalities. Stress LV EF: 69%.  Normal ECG stress. The patient exercised for 4 minutes and 29 seconds of a Bruce protocol, achieving approximately 6.43 METs & 89% MPHR. Accelerated HR response. Reduced exercise tolerance. no symptoms. The blood pressure response was normal. No previous exam available for comparison. Low risk.

## 2022-10-17 NOTE — Progress Notes (Signed)
Anesthesia Review:  PCP: Glory Buff, PA [facility]  Cardiologist : Jacinto Halim- Next ov on 10/19/22 for followup  Chest x-ray : 08/22/22- 2 view  EKG : 09/07/22 - Echo : 10/05/22  Stress test: 10/12/22  Cardiac Cath :  Activity level:  Sleep Study/ CPAP : none  Fasting Blood Sugar :      / Checks Blood Sugar -- times a day:   Blood Thinner/ Instructions /Last Dose: ASA / Instructions/ Last Dose :    DM- type 2  Ozempic- Last dose on 10/14/22   Recent eye surgery and glaucoma surgeryon 06/2022.

## 2022-10-18 ENCOUNTER — Encounter: Payer: Self-pay | Admitting: Cardiology

## 2022-10-19 ENCOUNTER — Encounter: Payer: Self-pay | Admitting: Cardiology

## 2022-10-19 ENCOUNTER — Ambulatory Visit: Payer: BC Managed Care – PPO | Admitting: Cardiology

## 2022-10-19 VITALS — BP 110/77 | HR 90 | Resp 14 | Ht 61.0 in | Wt 265.8 lb

## 2022-10-19 DIAGNOSIS — I10 Essential (primary) hypertension: Secondary | ICD-10-CM

## 2022-10-19 DIAGNOSIS — R0609 Other forms of dyspnea: Secondary | ICD-10-CM

## 2022-10-19 DIAGNOSIS — Z01818 Encounter for other preprocedural examination: Secondary | ICD-10-CM

## 2022-10-19 NOTE — Progress Notes (Signed)
Primary Physician/Referring:  Alyson Ingles, PA-C  Patient ID: Denise Cook, female    DOB: 1975-07-03, 47 y.o.   MRN: [id number]  Chief Complaint  Patient presents with   Chest Pain   Hypertension   Follow-up    6 weeks   HPI:    Denise Cook  is a 47 y.o. Patient with diabetes mellitus, vitamin D deficiency, inflammatory polyarthropathy and sarcoidosis of the skin and eyes, morbid obesity with OSA not on CPAP referred to me for evaluation of chest pain and dyspnea on exertion and cardiac risk stratification for upcoming bariatric surgery.    Patient states that over the past several months she has started noticing chest pain which she reports with 1 finger on the left side of her breast that is present on and off, with or without exertion activity.  Sometimes she feels the pain radiates to her jaw especially when she is busy and active.  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  She also has chronic dyspnea on exertion.  No PND or orthopnea.  No leg edema.  She is in the process of going through evaluation for bariatric surgery.  Past Medical History:  Diagnosis Date   Allergy    Asthma    Depression    Diabetes mellitus    diet control   Glaucoma    Obesity    Pseudotumor cerebri    Sarcoidosis 2012   Past Surgical History:  Procedure Laterality Date   ABDOMINAL HYSTERECTOMY     BIOPSY  06/21/2021   Procedure: BIOPSY;  Surgeon: Kerin Salen, MD;  Location: [facility];  Service: Gastroenterology;;   CARDIAC CATHETERIZATION     CHOLECYSTECTOMY     COLONOSCOPY WITH PROPOFOL N/A 06/21/2021   Procedure: COLONOSCOPY WITH PROPOFOL;  Surgeon: Kerin Salen, MD;  Location: [facility];  Service: Gastroenterology;  Laterality: N/A;   EYE SURGERY Left    Removed vitreous membrane   glaucoma shunt     INCISION AND DRAINAGE Right 05/01/2014   Procedure: INCISION AND DRAINAGE RIGHT INDEX FLEXOR SHEATH  ;  Surgeon: Dairl Ponder, MD;  Location: [facility];  Service: Orthopedics;  Laterality: Right;   left eye surgery     Family History  Adopted: Yes  Problem Relation Age of Onset   Alcoholism Mother    Cancer Maternal Grandmother    Diabetes Paternal Grandmother    Healthy Daughter     Social History   Tobacco Use   Smoking status: Never   Smokeless tobacco: Never  Substance Use Topics   Alcohol use: No   Marital Status: Single  ROS  Review of Systems  Cardiovascular:  Positive for chest pain and dyspnea on exertion. Negative for leg swelling.   Objective      10/19/2022    3:56 PM 09/07/2022    3:43 PM 09/07/2022    2:56 PM  Vitals with BMI  Height 5\' 1"     Weight 265 lbs 13 oz    BMI 50.25    Systolic 110 128 469  Diastolic 77 91 106  Pulse 90  95   Blood pressure 110/77, pulse 90, resp. rate 14, height 5\' 1"  (1.549 m), weight 265 lb 12.8 oz (120.6 kg), SpO2 97%.  Physical Exam Constitutional:      Comments: Morbidly obese in no acute distress.  Neck:     Vascular: No carotid bruit or JVD.  Cardiovascular:     Rate and Rhythm: Normal rate  and regular rhythm.     Pulses: Intact distal pulses.     Heart sounds: Normal heart sounds. No murmur heard.    No gallop.  Pulmonary:     Effort: Pulmonary effort is normal.     Breath sounds: Normal breath sounds.  Abdominal:     General: Bowel sounds are normal.     Palpations: Abdomen is soft.     Comments: Obese. Pannus present  Musculoskeletal:     Right lower leg: No edema.     Left lower leg: No edema.    Laboratory examination:   Recent Labs    08/22/22 1208  NA 140  K 3.4*  CL 105  CO2 23  GLUCOSE 107*  BUN 9  CREATININE 0.82  CALCIUM 9.5  GFRNONAA >60    Lab Results  Component Value Date   GLUCOSE 107 (H) 08/22/2022   NA 140 08/22/2022   K 3.4 (L) 08/22/2022   CL 105 08/22/2022   CO2 23 08/22/2022   BUN 9 08/22/2022   CREATININE 0.82 08/22/2022   GFRNONAA >60 08/22/2022   CALCIUM 9.5 08/22/2022   PROT 7.3 03/16/2021    ALBUMIN 3.6 03/16/2021   BILITOT 0.2 (L) 03/16/2021   ALKPHOS 74 03/16/2021   AST 24 03/16/2021   ALT 16 03/16/2021   ANIONGAP 12 08/22/2022     External labs:   Labs 04/18/2022:  Vitamin D 33.5. TSH normal at 2.73. A1c 7.1%.  Hb 12.5/HCT 38.4, platelets 401, elevated.  Normal indicis.  Serum glucose 140 mg, BUN 11, creatinine 1.12, EGFR 61 mL, potassium 4.2, LFTs normal.  Total cholesterol 159, triglycerides 159, HDL 50, LDL 82.  Non-HDL cholesterol 109.  Radiology:   Chest x-ray PA and lateral view 08/22/2022: The heart size and mediastinal contours are within normal limits. There is no evidence of pulmonary edema, consolidation, pneumothorax, nodule or pleural fluid. The visualized skeletal structures are unremarkable.  Cardiac Studies:   PCV MYOCARDIAL PERFUSION WO LEXISCAN 10/02/2022  Narrative Exercise nuclear stress test 10/12/2022: Myocardial perfusion is normal. Overall LV systolic function is normal without regional wall motion abnormalities. Stress LV EF: 69%. Normal ECG stress. The patient exercised for 4 minutes and 29 seconds of a Bruce protocol, achieving approximately 6.43 METs & 89% MPHR. Accelerated HR response. Reduced exercise tolerance. no symptoms. The blood pressure response was normal. No previous exam available for comparison. Low risk.   PCV ECHOCARDIOGRAM COMPLETE 10/02/2022  Narrative Echocardiogram 10/02/2022: Study Quality: Technically difficult study (poor subcostal images, BMI 52). Normal LV systolic function with visual EF 60-65%. Left ventricle cavity is normal in size. Normal left ventricular wall thickness. Normal global wall motion. Normal diastolic filling pattern, normal LAP. Systolic anterior motion of the mitral valve. Peak LVOT gradient 5.2 mmHg. No significant valvular heart disease. No prior study for comparison.    EKG:   EKG 09/07/2022: Normal sinus rhythm at rate of 94 bpm, normal axis, incomplete right bundle branch  block.  Otherwise normal EKG.    Medications and allergies   Allergies  Allergen Reactions   Methotrexate Other (See Comments)    Unknown reaction   Other Other (See Comments)    PEPPER sriracha turned tongue black    Metformin Rash   Metformin And Related Itching    Medication list   Current Outpatient Medications:    acetaminophen (TYLENOL) 500 MG tablet, Take 1,000 mg by mouth every 6 (six) hours as needed for headache. Reported on 07/06/2015, Disp: , Rfl:    acetaZOLAMIDE  ER (DIAMOX) 500 MG capsule, Take 1 capsule (500 mg total) by mouth 2 (two) times daily., Disp: 180 capsule, Rfl: 3   albuterol (VENTOLIN HFA) 108 (90 Base) MCG/ACT inhaler, Inhale 1-2 puffs into the lungs every 4 (four) hours as needed for wheezing, Disp: 6.7 g, Rfl: 2   ARIPiprazole (ABILIFY) 5 MG tablet, Take 1 tablet (5 mg total) by mouth at bedtime., Disp: 90 tablet, Rfl: 0   Armodafinil 250 MG tablet, Take 1 tablet (250 mg total) by mouth daily., Disp: 30 tablet, Rfl: 2   atorvastatin (LIPITOR) 10 MG tablet, Take 1/2 tablet (5 mg total) by mouth daily., Disp: 45 tablet, Rfl: 1   azaTHIOprine (IMURAN) 50 MG tablet, Take 4 tablets (200 mg total) by mouth daily., Disp: 360 tablet, Rfl: 3   betamethasone dipropionate 0.05 % cream, Apply topically to affected area up to 2 (two) times daily as needed. (Not to face, groin, axilla), Disp: 45 g, Rfl: 3   Blood Glucose Monitoring Suppl (FIFTY50 GLUCOSE METER 2.0) w/Device KIT, USE TO CHECK BLOOD SUGAR ONCE DAILY. ALTERNATING MORNINGS AND EVENINGS BEFORE MEALS, Disp: , Rfl:    brimonidine (ALPHAGAN) 0.2 % ophthalmic solution, Place 1 drop into both eyes 2 (two) times daily., Disp: 30 mL, Rfl: 3   brimonidine-timolol (COMBIGAN) 0.2-0.5 % ophthalmic solution, Place 1 drop into both eyes daily., Disp: , Rfl:    budesonide-formoterol (SYMBICORT) 80-4.5 MCG/ACT inhaler, Inhale 2 puffs into the lungs 2 (two) times daily., Disp: , Rfl:    Cholecalciferol (VITAMIN D3) 50 MCG (2000  UT) capsule, Take 1 capsule (2,000 Units total) by mouth daily., Disp: , Rfl:    dorzolamide-timolol (COSOPT) 2-0.5 % ophthalmic solution, Place 1 drop into both eyes 2 (two) times daily., Disp: , Rfl:    levocetirizine (XYZAL) 5 MG tablet, Take 1 tablet (5 mg total) by mouth every morning as needed for allergies, Disp: 90 tablet, Rfl: 0   lisdexamfetamine (VYVANSE) 40 MG capsule, Take 1 capsule (40 mg total) by mouth in the morning., Disp: 30 capsule, Rfl: 0   lisinopril-hydrochlorothiazide (ZESTORETIC) 20-12.5 MG tablet, Take 1 tablet by mouth every morning., Disp: 30 tablet, Rfl: 2   ondansetron (ZOFRAN) 4 MG tablet, Take 1 tablet (4 mg total) by mouth every 8 (eight) hours as needed for nausea, Disp: 30 tablet, Rfl: 2   Semaglutide, 2 MG/DOSE, (OZEMPIC, 2 MG/DOSE,) 8 MG/3ML SOPN, Inject 2 mg into the skin once a week., Disp: 3 mL, Rfl: 1  Assessment     ICD-10-CM   1. Pre-op evaluation  Z01.818     2. Dyspnea on exertion  R06.09     3. Primary hypertension  I10     4. Class 3 severe obesity due to excess calories with serious comorbidity and body mass index (BMI) of 50.0 to 59.9 in adult [facility])  E66.01    Z68.43       No orders of the defined types were placed in this encounter.  No orders of the defined types were placed in this encounter.  Medications Discontinued During This Encounter  Medication Reason   prednisoLONE acetate (PRED FORTE) 1 % ophthalmic suspension Completed Course   Topiramate ER (TROKENDI XR) 200 MG CP24 Patient Preference   aspirin (ASPIRIN CHILDRENS) 81 MG chewable tablet Discontinued by provider   amLODipine (NORVASC) 5 MG tablet Discontinued by provider     Recommendations:   Leahann Lempke is a 47 y.o. Patient with diabetes mellitus, vitamin D deficiency, inflammatory polyarthropathy and sarcoidosis of the  skin and eyes, morbid obesity with OSA not on CPAP referred to me for evaluation of chest pain and dyspnea on exertion and cardiac risk  stratification for upcoming bariatric surgery.  1. Pre-op evaluation I have already sent in preop risk evaluation to surgeon, patient is low risk.  I suspect her chest pain also has exertional component, may suggest noncardiac etiology including GERD which she has overall low risk nuclear stress test.  2. Dyspnea on exertion Related to obesity and deconditioning.  We discussed weight loss again.  3. Primary hypertension Blood pressure is well-controlled on lisinopril HCT and since she has lost about 37 pounds in weight being on Ozempic over the past 6 months, she has noticed worsening dizziness and low blood pressure.  I have discontinued amlodipine.  Advised her that if she continues to lose weight, she may have to reduce lisinopril as well  4. Class 3 severe obesity due to excess calories with serious comorbidity and body mass index (BMI) of 50.0 to 59.9 in adult [facility]) Patient has lost 37 pounds in weight over the past 6 months has been on Ozempic and it appears that she is also made a pulm mind to make lifestyle changes to lose weight.  I have discussed regarding increasing her physical activity and especially water aerobics to improve weight loss as well.  Otherwise stable from cardiac standpoint, unless symptoms in spite of weight loss and exercise continue to persist, then she will need further evaluation otherwise I will see her back on a as needed basis.  Patient was encouraged to continue to lose weight with that without surgery, extensive discussion was had regarding diet and calories.  She appears very motivated.    Yates Decamp, MD, [facility] 10/19/2022, 10:04 PM Office: [phone]

## 2022-10-25 ENCOUNTER — Other Ambulatory Visit (HOSPITAL_COMMUNITY): Payer: Self-pay

## 2022-10-27 ENCOUNTER — Other Ambulatory Visit: Payer: Self-pay

## 2022-10-27 ENCOUNTER — Encounter (HOSPITAL_COMMUNITY)
Admission: RE | Disposition: A | Discharge status: Home / Self Care | Payer: Self-pay | Source: Home / Self Care | Attending: Surgery

## 2022-10-27 ENCOUNTER — Ambulatory Visit (HOSPITAL_COMMUNITY): Payer: BC Managed Care – PPO | Admitting: Anesthesiology

## 2022-10-27 ENCOUNTER — Encounter (HOSPITAL_COMMUNITY): Payer: Self-pay | Admitting: Surgery

## 2022-10-27 ENCOUNTER — Ambulatory Visit (HOSPITAL_COMMUNITY)
Admission: RE | Admit: 2022-10-27 | Discharge: 2022-10-27 | Disposition: A | Discharge status: Home / Self Care | Payer: BC Managed Care – PPO | Attending: Surgery | Admitting: Surgery

## 2022-10-27 DIAGNOSIS — K219 Gastro-esophageal reflux disease without esophagitis: Secondary | ICD-10-CM | POA: Insufficient documentation

## 2022-10-27 DIAGNOSIS — K2970 Gastritis, unspecified, without bleeding: Secondary | ICD-10-CM | POA: Diagnosis not present

## 2022-10-27 HISTORY — PX: BIOPSY: SHX5522

## 2022-10-27 HISTORY — PX: ESOPHAGOGASTRODUODENOSCOPY: SHX5428

## 2022-10-27 LAB — GLUCOSE, CAPILLARY: Glucose-Capillary: 71 mg/dL (ref 70–99)

## 2022-10-27 SURGERY — EGD (ESOPHAGOGASTRODUODENOSCOPY)
Anesthesia: Monitor Anesthesia Care

## 2022-10-27 MED ORDER — PROPOFOL 10 MG/ML IV BOLUS
INTRAVENOUS | Status: DC | PRN
  Administered 2022-10-27: 10 mg via INTRAVENOUS
  Administered 2022-10-27: 20 mg via INTRAVENOUS
  Administered 2022-10-27: 10 mg via INTRAVENOUS

## 2022-10-27 MED ORDER — PROPOFOL 500 MG/50ML IV EMUL
INTRAVENOUS | Status: DC | PRN
  Administered 2022-10-27: 120 ug/kg/min via INTRAVENOUS

## 2022-10-27 MED ORDER — LIDOCAINE HCL 1 % IJ SOLN
INTRAMUSCULAR | Status: DC | PRN
  Administered 2022-10-27: 50 mg via INTRADERMAL

## 2022-10-27 MED ORDER — LACTATED RINGERS IV SOLN: INTRAVENOUS | Status: DC

## 2022-10-27 MED ORDER — PROPOFOL 1000 MG/100ML IV EMUL
INTRAVENOUS | Status: AC
  Filled 2022-10-27: qty 100

## 2022-10-27 NOTE — H&P (Signed)
Admitting Physician: [name] Deron Poole  Service: Bariatric surgery  CC: EGD prior to bariatric surgery  Subjective   HPI: Denise Cook is an 47 y.o. female who is here for EGD prior to bariatric surgery  Past Medical History:  Diagnosis Date   Allergy    Asthma    Depression    Diabetes mellitus    diet control   Glaucoma    Obesity    Pseudotumor cerebri    Sarcoidosis 2012    Past Surgical History:  Procedure Laterality Date   ABDOMINAL HYSTERECTOMY     BIOPSY  06/21/2021   Procedure: BIOPSY;  Surgeon: Kerin Salen, MD;  Location: [facility];  Service: Gastroenterology;;   CARDIAC CATHETERIZATION     CHOLECYSTECTOMY     COLONOSCOPY WITH PROPOFOL N/A 06/21/2021   Procedure: COLONOSCOPY WITH PROPOFOL;  Surgeon: Kerin Salen, MD;  Location: [facility];  Service: Gastroenterology;  Laterality: N/A;   EYE SURGERY Left    Removed vitreous membrane   glaucoma shunt     INCISION AND DRAINAGE Right 05/01/2014   Procedure: INCISION AND DRAINAGE RIGHT INDEX FLEXOR SHEATH  ;  Surgeon: Dairl Ponder, MD;  Location: [facility];  Service: Orthopedics;  Laterality: Right;   left eye surgery      Family History  Adopted: Yes  Problem Relation Age of Onset   Alcoholism Mother    Cancer Maternal Grandmother    Diabetes Paternal Grandmother    Healthy Daughter     Social:  reports that she has never smoked. She has never used smokeless tobacco. She reports that she does not drink alcohol and does not use drugs.  Allergies:  Allergies  Allergen Reactions   Methotrexate Other (See Comments)    Unknown reaction   Other Other (See Comments)    PEPPER sriracha turned tongue black    Metformin Rash   Metformin And Related Itching    Medications: Current Outpatient Medications  Medication Instructions   acetaminophen (TYLENOL) 1,000 mg, Oral, Every 6 hours PRN, Reported on 07/06/2015   acetaZOLAMIDE ER (DIAMOX) 500 mg, Oral, 2 times daily    albuterol (VENTOLIN HFA) 108 (90 Base) MCG/ACT inhaler Inhale 1-2 puffs into the lungs every 4 (four) hours as needed for wheezing   ARIPiprazole (ABILIFY) 5 mg, Oral, Daily at bedtime   Armodafinil 250 mg, Oral, Daily   atorvastatin (LIPITOR) 10 MG tablet Take 1/2 tablet (5 mg total) by mouth daily.   azaTHIOprine (IMURAN) 200 mg, Oral, Daily   Blood Glucose Monitoring Suppl (FIFTY50 GLUCOSE METER 2.0) w/Device KIT USE TO CHECK BLOOD SUGAR ONCE DAILY. ALTERNATING MORNINGS AND EVENINGS BEFORE MEALS   brimonidine (ALPHAGAN) 0.2 % ophthalmic solution 1 drop, Both Eyes, 2 times daily   dorzolamide-timolol (COSOPT) 2-0.5 % ophthalmic solution 1 drop, Both Eyes, 2 times daily   levocetirizine (XYZAL) 5 MG tablet Take 1 tablet (5 mg total) by mouth every morning as needed for allergies   lisinopril-hydrochlorothiazide (ZESTORETIC) 20-12.5 MG tablet 1 tablet, Oral, BH-each morning   ondansetron (ZOFRAN) 4 MG tablet Take 1 tablet (4 mg total) by mouth every 8 (eight) hours as needed for nausea   Ozempic (2 MG/DOSE) 2 mg, Subcutaneous, Weekly   Vitamin D-3 5,000 Units, Oral, Daily   Vyvanse 40 mg, Oral, Every morning    ROS - all of the below systems have been reviewed with the patient and positives are indicated with bold text General: chills, fever or night sweats Eyes: blurry vision or double vision  ENT: epistaxis or sore throat Allergy/Immunology: itchy/watery eyes or nasal congestion Hematologic/Lymphatic: bleeding problems, blood clots or swollen lymph nodes Endocrine: temperature intolerance or unexpected weight changes Breast: new or changing breast lumps or nipple discharge Resp: cough, shortness of breath, or wheezing CV: chest pain or dyspnea on exertion GI: as per HPI GU: dysuria, trouble voiding, or hematuria MSK: joint pain or joint stiffness Neuro: TIA or stroke symptoms Derm: pruritus and skin lesion changes Psych: anxiety and depression  Objective   PE Blood pressure  130/80, pulse 80, temperature (!) 97.5 F (36.4 C), temperature source Temporal, resp. rate 18, height 5\' 1"  (1.549 m), weight 120.2 kg, SpO2 100%. Constitutional: NAD; conversant; no deformities Eyes: Moist conjunctiva; no lid lag; anicteric; PERRL Neck: Trachea midline; no thyromegaly Lungs: Normal respiratory effort; no tactile fremitus CV: RRR; no palpable thrills; no pitting edema GI: Abd Soft, nontender; no palpable hepatosplenomegaly MSK: Normal range of motion of extremities; no clubbing/cyanosis Psychiatric: Appropriate affect; alert and oriented x3 Lymphatic: No palpable cervical or axillary lymphadenopathy  No results found for this or any previous visit (from the past 24 hour(s)).  Imaging Orders  No imaging studies ordered today     Assessment and Plan   Denise Cook is an 47 y.o. female with obesity, here for EGD prior to bariatric surgery.  I explained my rational for performing upper endoscopy in my bariatric patients. During the procedure I will biopsy for H. Pylori, evaluate for hiatal hernia, evaluate for reflux esophagitis and look for any other abnormalities that may influence the procedure. We discussed the risks, benefits and alternative to this procedure and the patient granted consent to proceed.   Quentin Ore, MD  [facility] Use [email or web address] to contact on call provider

## 2022-10-27 NOTE — Discharge Instructions (Signed)

## 2022-10-27 NOTE — Op Note (Signed)
[facility] Patient Name: Denise Cook Procedure Date: 10/27/2022 MRN: [id number] Attending MD: Denise Cook , ,  Date of Birth: 15-Nov-1975 CSN: [id number] Age: 47 Admit Type: Outpatient Procedure:                Upper GI endoscopy Indications:               Providers:                Denise Cook, Denise Richer, RN, Denise Shire RN, RN, Denise Cook, Technician Referring MD:              Medicines:                Monitored Anesthesia Care Complications:            No immediate complications. Estimated Blood Loss:     Estimated blood loss was minimal. Procedure:                Pre-Anesthesia Assessment:                           - Prior to the procedure, a History and Physical                            was performed, and patient medications and                            allergies were reviewed. The patient is competent.                            The risks and benefits of the procedure and the                            sedation options and risks were discussed with the                            patient. All questions were answered and informed                            consent was obtained. Patient identification and                            proposed procedure were verified by the physician                            in the pre-procedure area. Mental Status                            Examination: alert and oriented. Airway                            Examination: normal oropharyngeal airway and neck  mobility. Respiratory Examination: clear to                            auscultation. CV Examination: normal. ASA Grade                            Assessment: II - A patient with mild systemic                            disease. After reviewing the risks and benefits,                            the patient was deemed in satisfactory condition to                            undergo the  procedure. The anesthesia plan was to                            use monitored anesthesia care (MAC). Immediately                            prior to administration of medications, the patient                            was re-assessed for adequacy to receive sedatives.                            The heart rate, respiratory rate, oxygen                            saturations, blood pressure, adequacy of pulmonary                            ventilation, and response to care were monitored                            throughout the procedure. The physical status of                            the patient was re-assessed after the procedure.                           After obtaining informed consent, the endoscope was                            passed under direct vision. Throughout the                            procedure, the patient's blood pressure, pulse, and                            oxygen saturations were monitored continuously. The  [id number]-[id number] ([id number]) Olympus endoscope was introduced                            through the mouth, and advanced to the second part                            of duodenum. The upper GI endoscopy was                            accomplished without difficulty. The patient                            tolerated the procedure well. Scope In: Scope Out: Findings:      The examined esophagus was normal.      The gastroesophageal flap valve was visualized endoscopically and       classified as Hill Grade I (prominent fold, tight to endoscope).      Diffuse mild inflammation was found in the gastric fundus.      The pylorus was normal. Biopsies were taken with a cold forceps for       Helicobacter pylori testing. Verification of patient identification for       the specimen was done. Estimated blood loss was minimal.      The examined duodenum was normal. Impression:               - Normal esophagus.                           -  Gastroesophageal flap valve classified as Hill                            Grade I (prominent fold, tight to endoscope).                           - Gastritis.                           - Normal pylorus. Biopsied.                           - Normal examined duodenum. Moderate Sedation:      Moderate (conscious) sedation was personally administered by an       anesthesia professional. The following parameters were monitored: oxygen       saturation, heart rate, blood pressure, and response to care. Total       physician intraservice time was 1 minute. Recommendation:           - Discharge patient to home.                           - Resume previous diet. Procedure Code(s):        --- Professional ---                           [phone], Esophagogastroduodenoscopy, flexible,                            transoral; with  biopsy, single or multiple Diagnosis Code(s):        --- Professional ---                           K29.70, Gastritis, unspecified, without bleeding CPT copyright 2022 American Medical Association. All rights reserved. The codes documented in this report are preliminary and upon coder review may  be revised to meet current compliance requirements. Denise Cook,  10/27/2022 12:37:08 PM This report has been signed electronically. Number of Addenda: 0

## 2022-10-27 NOTE — Anesthesia Postprocedure Evaluation (Signed)
Anesthesia Post Note  Patient: Cutina Deavila  Procedure(s) Performed: ESOPHAGOGASTRODUODENOSCOPY (EGD) BIOPSY     Patient location during evaluation: Endoscopy Anesthesia Type: MAC Level of consciousness: awake and alert Pain management: pain level controlled Vital Signs Assessment: post-procedure vital signs reviewed and stable Respiratory status: spontaneous breathing, nonlabored ventilation and respiratory function stable Cardiovascular status: stable and blood pressure returned to baseline Postop Assessment: no apparent nausea or vomiting Anesthetic complications: no  No notable events documented.  Last Vitals:  Vitals:   10/27/22 1238 10/27/22 1250  BP: 105/63 111/65  Pulse: 95 81  Resp: (!) 22 (!) 26  Temp: 36.6 C   SpO2: 100% 100%    Last Pain:  Vitals:   10/27/22 1250  TempSrc:   PainSc: 0-No pain                 Algie Cales,W. EDMOND

## 2022-10-27 NOTE — Transfer of Care (Signed)
Immediate Anesthesia Transfer of Care Note  Patient: Denise Cook  Procedure(s) Performed: ESOPHAGOGASTRODUODENOSCOPY (EGD) BIOPSY  Patient Location: PACU and Endoscopy Unit  Anesthesia Type:MAC  Level of Consciousness: awake, alert , oriented, and patient cooperative  Airway & Oxygen Therapy: Patient Spontanous Breathing and Patient connected to face mask oxygen  Post-op Assessment: Report given to RN and Post -op Vital signs reviewed and stable  Post vital signs: Reviewed and stable  Last Vitals:  Vitals Value Taken Time  BP    Temp    Pulse 88 10/27/22 1237  Resp    SpO2 100 % 10/27/22 1237  Vitals shown include unfiled device data.  Last Pain:  Vitals:   10/27/22 1036  TempSrc: Temporal  PainSc: 0-No pain         Complications: No notable events documented.

## 2022-10-27 NOTE — Anesthesia Preprocedure Evaluation (Addendum)
Anesthesia Evaluation  Patient identified by MRN, date of birth, ID band Patient awake    Reviewed: Allergy & Precautions, H&P , NPO status , Patient's Chart, lab work & pertinent test results  Airway Mallampati: II  TM Distance: >3 FB Neck ROM: Full    Dental no notable dental hx. (+) Teeth Intact, Dental Advisory Given   Pulmonary asthma    Pulmonary exam normal breath sounds clear to auscultation       Cardiovascular negative cardio ROS  Rhythm:Regular Rate:Normal     Neuro/Psych    Depression Bipolar Disorder   negative neurological ROS     GI/Hepatic negative GI ROS, Neg liver ROS,,,  Endo/Other  diabetes, Type 2, Oral Hypoglycemic Agents  Morbid obesity  Renal/GU negative Renal ROS  negative genitourinary   Musculoskeletal   Abdominal   Peds  Hematology negative hematology ROS (+)   Anesthesia Other Findings   Reproductive/Obstetrics negative OB ROS                             Anesthesia Physical Anesthesia Plan  ASA: 3  Anesthesia Plan: MAC   Post-op Pain Management: Minimal or no pain anticipated   Induction: Intravenous  PONV Risk Score and Plan: 2  Airway Management Planned: Natural Airway and Simple Face Mask  Additional Equipment:   Intra-op Plan:   Post-operative Plan:   Informed Consent: I have reviewed the patients History and Physical, chart, labs and discussed the procedure including the risks, benefits and alternatives for the proposed anesthesia with the patient or authorized representative who has indicated his/her understanding and acceptance.     Dental advisory given  Plan Discussed with: CRNA  Anesthesia Plan Comments:        Anesthesia Quick Evaluation

## 2022-10-30 ENCOUNTER — Encounter (HOSPITAL_COMMUNITY): Payer: Self-pay | Admitting: Surgery

## 2022-10-30 ENCOUNTER — Ambulatory Visit: Payer: BC Managed Care – PPO | Admitting: Dietician

## 2022-11-01 ENCOUNTER — Other Ambulatory Visit (HOSPITAL_COMMUNITY): Payer: Self-pay

## 2022-11-01 MED ORDER — ARMODAFINIL 250 MG PO TABS
250.0000 mg | ORAL_TABLET | Freq: Every morning | ORAL | 5 refills | Status: AC
Start: 2022-11-01 — End: ?
  Filled 2022-11-01: qty 30, 30d supply, fill #0
  Filled 2022-12-05: qty 30, 30d supply, fill #1
  Filled 2023-01-09: qty 30, 30d supply, fill #2
  Filled 2023-02-15: qty 30, 30d supply, fill #3
  Filled 2023-03-16: qty 30, 30d supply, fill #4

## 2022-11-06 ENCOUNTER — Other Ambulatory Visit (HOSPITAL_COMMUNITY): Payer: Self-pay

## 2022-11-06 MED ORDER — ARIPIPRAZOLE 5 MG PO TABS
5.0000 mg | ORAL_TABLET | Freq: Every day | ORAL | 0 refills | Status: DC
  Filled 2022-11-06: qty 90, 90d supply, fill #0

## 2022-11-06 MED ORDER — LEVOCETIRIZINE DIHYDROCHLORIDE 5 MG PO TABS
5.0000 mg | ORAL_TABLET | Freq: Every morning | ORAL | 0 refills | Status: DC
  Filled 2022-11-06: qty 90, 90d supply, fill #0

## 2022-11-07 ENCOUNTER — Other Ambulatory Visit (HOSPITAL_COMMUNITY): Payer: Self-pay

## 2022-11-07 MED ORDER — LISDEXAMFETAMINE DIMESYLATE 40 MG PO CAPS
40.0000 mg | ORAL_CAPSULE | Freq: Every morning | ORAL | 0 refills | Status: DC
Start: 2022-11-06 — End: 2022-11-16
  Filled 2022-11-07: qty 30, 30d supply, fill #0

## 2022-11-07 MED ORDER — OZEMPIC (2 MG/DOSE) 8 MG/3ML ~~LOC~~ SOPN
2.0000 mg | PEN_INJECTOR | SUBCUTANEOUS | 1 refills | Status: DC
  Filled 2022-11-07: qty 3, 28d supply, fill #0
  Filled 2022-12-05: qty 3, 28d supply, fill #1

## 2022-11-14 ENCOUNTER — Encounter: Payer: BC Managed Care – PPO | Attending: Surgery | Admitting: Dietician

## 2022-11-14 ENCOUNTER — Encounter: Payer: Self-pay | Admitting: Dietician

## 2022-11-14 DIAGNOSIS — E1136 Type 2 diabetes mellitus with diabetic cataract: Secondary | ICD-10-CM

## 2022-11-14 NOTE — Progress Notes (Signed)
Nutrition Assessment for Bariatric Surgery: Pre-Surgery Behavioral and Nutrition Intervention Program   Medical Nutrition Therapy  Appt Start Time: 8:46    End Time: 9:43  Patient was seen on 11/14/2022 for Pre-Operative Nutrition Assessment. Purpose of todays visit  enhance perioperative outcomes along with a healthy weight maintenance   Referral stated Supervised Weight Loss (SWL) visits needed: 0  Not cleared at this time:  Pt to follow up for minimum of one more visit to assist pt with progressing through stages of change/further nutrition education. RD advised pt that this follow up visit is not mandated through insurance. Pt verbalized agreement.  Planned surgery: Sleeve Gastrectomy Pt expectation of surgery: To be 170 lbs   NUTRITION ASSESSMENT   Anthropometrics  Start weight at [facility]: 264.4 lbs (date: 11/14/2022)  Height: 61 in BMI: 49.96 kg/m2     Clinical   Pharmacotherapy: History of weight loss medication used: Ozempic  Medical hx: sleep apnea, obesity, asthma, HTN, T2DM Medications: Ozempic, zofran, lisinopril, vyvanse, xyzal, vit D,  lipitor, imuran, armodafinil, abilify, albuterol, diamox  Labs: A1c 6.7; urea nitrogen 6; CO2 17; platelets 464; creatinine 1.08; BUN/creatinine ratio 7 Notable signs/symptoms: none noted Any previous deficiencies? No  Evaluation of Nutritional Deficiencies: Micronutrient Nutrition Focused Physical Exam: Hair: No issues observed Eyes: No issues observed Mouth: No issues observed Neck: No issues observed Nails: No issues observed Skin: No issues observed  Lifestyle & Dietary Hx  Pt states she has a lot of habits to change.   Current Physical Activity Recommendations state 150 minutes per week of moderate to vigorous movement including Cardio and 1-2 days of resistance activities as well as flexibility/balance activities:  Pts current physical activity: ADLs, with 0% recommendation reached   Sleep Hygiene: duration and quality:  good, pt states she uses a mouth device for sleep apnea.  Current Patient Perceived Stress Level as stated by pt on a scale of 1-10: 4       Stress Management Techniques: writing, take a walk, take deep breaths, play a game on phone  According to the Dietary Guidelines for Americans Recommendation: equivalent 1.5-2 cups fruits per day, equivalent 2-3 cups vegetables per day and at least half all grains whole  Fruit servings per day (on average): 0, meeting 0% recommendation  Non-starchy vegetable servings per day (on average): 0, meeting 0% recommendation  Whole Grains per day (on average): 0  Number of meals missed/skipped per week out of 21: 10  24-Hr Dietary Recall First Meal: half a biscuit, coffee Snack:  Second Meal: skip Snack:  Third Meal: meat and greens Snack:  Beverages: water, caffeine free pepsi, coffee (iced)  Alcoholic beverages per week: 0   Estimated Energy Needs Calories: 1500   NUTRITION DIAGNOSIS  Overweight/obesity ([place]-3.3) related to past poor dietary habits and physical inactivity as evidenced by patient w/ planned sleeve surgery following dietary guidelines for continued weight loss.  NUTRITION INTERVENTION  Nutrition counseling (C-1) and education (E-2) to facilitate bariatric surgery goals.  Educated pt on micronutrient deficiencies post-surgery and behavioral/dietary strategies to start in order to mitigate that risk   Behavioral and Dietary Interventions Pre-Op Goals Reviewed with the Patient Nutrition: Healthy Eating Behaviors Switch to non-caloric, non-carbonated and non-caffeinated beverages such as  water, unsweetened tea, Crystal Light and zero calorie beverages (aim for 64 oz. per day) Cut out grazing between meals or at night  Find a protein shake you like Eat every 3-5 hours        Eliminate distractions while eating (TV,  computer, reading, driving, texting) Take 52-84 minutes to eat a meal  Decrease high sugar foods/decrease high  fat/fried foods Eliminate alcoholic beverages Increase protein intake (eggs, fish, chicken, yogurt) before surgery Eat non starchy vegetables 2 times a day 7 days a week Eat complex carbohydrates such as whole grains and fruits   Behavioral Modification: Physical Activity Increase my usual daily activity (use stairs, park farther, etc.) Engage in _walking_  activity  ___15____ minutes ___5___ times per week  Other:    _________________________________________________________________     Problem Solving I will think about my usual eating patterns and how to tweak them How can my friends and family support me Barriers to starting my changes Learn and understand appetite verses hunger   Healthy Coping Allow for ___________ activities per week to help me manage stress Reframe negative thoughts I will keep a picture of someone or something that is my inspiration & look at it daily   Monitoring  Weigh myself once a week  Measure my progress by monitoring how my clothes fit Keep a food record of what I eat and drink for the next ________ (time period) Take pictures of what I eat and drink for the next ________ (time period) Use an app to count steps/day for the next_______ (time period) Measure my progress such as increased energy and more restful sleep Monitor your acid reflux and bowel habits, are they getting better?   *Goals that are bolded indicate the pt would like to start working towards these  Handouts Provided Include  Bariatric Surgery handouts (Nutrition Visits, Pre Surgery Behavioral Change Goals, Protein Shakes Brands to Choose From, Vitamins & Mineral Supplementation)  Learning Style & Readiness for Change Teaching method utilized: Visual, Auditory, and hands on  Demonstrated degree of understanding via: Teach Back  Readiness Level: preparation Barriers to learning/adherence to lifestyle change: nothing identified   RD's Notes for Next Visit Pt progress toward chosen  goals    MONITORING & EVALUATION Dietary intake, weekly physical activity, body weight, and preoperative behavioral change goals  Next Steps  Patient is to follow up at [facility] to assist pt with progressing through stages of change/further nutrition education.

## 2022-11-16 ENCOUNTER — Other Ambulatory Visit (HOSPITAL_COMMUNITY): Payer: Self-pay

## 2022-11-16 MED ORDER — LISDEXAMFETAMINE DIMESYLATE 40 MG PO CAPS
40.0000 mg | ORAL_CAPSULE | Freq: Every morning | ORAL | 0 refills | Status: DC
Start: 2022-11-16 — End: 2022-12-14
  Filled 2022-11-16: qty 30, 30d supply, fill #0

## 2022-11-16 MED ORDER — MOUNJARO 10 MG/0.5ML ~~LOC~~ SOAJ
10.0000 mg | SUBCUTANEOUS | 0 refills | Status: DC
Start: 2022-11-16 — End: 2023-01-24
  Filled 2022-11-16 – 2023-01-09 (×2): qty 2, 28d supply, fill #0

## 2022-11-30 ENCOUNTER — Encounter: Payer: BC Managed Care – PPO | Admitting: Dietician

## 2022-11-30 ENCOUNTER — Encounter: Payer: Self-pay | Admitting: Dietician

## 2022-11-30 VITALS — Ht 61.0 in | Wt 261.9 lb

## 2022-11-30 DIAGNOSIS — E669 Obesity, unspecified: Secondary | ICD-10-CM | POA: Insufficient documentation

## 2022-11-30 DIAGNOSIS — E119 Type 2 diabetes mellitus without complications: Secondary | ICD-10-CM | POA: Insufficient documentation

## 2022-11-30 DIAGNOSIS — Z794 Long term (current) use of insulin: Secondary | ICD-10-CM | POA: Insufficient documentation

## 2022-11-30 NOTE — Progress Notes (Signed)
Pre-Surgery Visit Bariatric Nutrition Education Appt Start Time: 4:53    End Time: 5:19  Referral stated Supervised Weight Loss (SWL) visits needed: 0  Pt completed visits.   Pt has cleared nutrition requirements.   Planned surgery: Sleeve Gastrectomy Pt expectation of surgery: To be 170 lbs   NUTRITION ASSESSMENT   Anthropometrics  Start weight at [facility]: 264.4 lbs (date: 11/14/2022)  Height: 61 in Weight today: 261.9 lbs BMI: 49.96 kg/m2    Clinical   Pharmacotherapy: History of weight loss medication used: Ozempic  Medical hx: sleep apnea, obesity, asthma, HTN, T2DM Medications: Ozempic, zofran, lisinopril, vyvanse, xyzal, vit D,  lipitor, imuran, armodafinil, abilify, albuterol, diamox  Labs: A1c 6.7; urea nitrogen 6; CO2 17; platelets 464; creatinine 1.08; BUN/creatinine ratio 7 Notable signs/symptoms: none noted Any previous deficiencies? No  Lifestyle & Dietary Hx  Pt arrived positive and excited about physical activity accomplished. Pt states she is doing well getting something to eat to avoid skipping meals, stating she is meal prepping on Sunday and intentional about what she was packing to eat. Pt states she would pack fruit for snack. Pt states she hasn't been drinking while she eats, stating she has waited for 30 minutes after she is done eating to drink again.  Estimated daily fluid intake: 48 oz Supplements: vit D Current average weekly physical activity: walking 1 mile daily (20-25 minutes; including stairs and hills)  24-Hr Dietary Recall First Meal: half a biscuit, coffee or jimmy dean breakfast bowl or [place] sausage and grits Snack:  Second Meal: chicken green and green beans Snack: grapes Third Meal: meat (chicken or pork chops) or spaghetti Snack:  Beverages: water, caffeine free pepsi, coffee (iced)  Estimated Energy Needs Calories: 1500  NUTRITION DIAGNOSIS  Overweight/obesity ([place]-3.3) related to past poor dietary habits and physical  inactivity as evidenced by patient w/ planned sleeve surgery following dietary guidelines for continued weight loss.  NUTRITION INTERVENTION  Nutrition counseling (C-1) and education (E-2) to facilitate bariatric surgery goals.  Fruits & Vegetables: Aim to fill half your plate with a variety of fruits and vegetables. They are rich in vitamins, minerals, and fiber, and can help reduce the risk of chronic diseases. Choose a colorful assortment of fruits and vegetables to ensure you get a wide range of nutrients. Grains and Starches: Make at least half of your grain choices whole grains, such as Vivienne Sangiovanni rice, whole wheat bread, and oats. Whole grains provide fiber, which aids in digestion and healthy cholesterol levels. Aim for whole forms of starchy vegetables such as potatoes, sweet potatoes, beans, peas, and corn, which are fiber rich and provide many vitamins and minerals.  Protein: Incorporate lean sources of protein, such as poultry, fish, beans, nuts, and seeds, into your meals. Protein is essential for building and repairing tissues, staying full, balancing blood sugar, as well as supporting immune function. Dairy: Include low-fat or fat-free dairy products like milk, yogurt, and cheese in your diet. Dairy foods are excellent sources of calcium and vitamin D, which are crucial for bone health.  Physical Activity: Aim for 150 minutes of physical activity weekly. Regular physical activity promotes overall health-including helping to reduce risk for heart disease and diabetes, promoting mental health, and helping [place] sleep better.  Why you need complex carbohydrates: Whole grains and other complex carbohydrates are required to have a healthy diet. Whole grains provide fiber which can help with blood glucose levels and help keep you satiated. Fruits and starchy vegetables provide essential vitamins and minerals required  for immune function, eyesight support, brain support, bone density, wound healing and  many other functions within the body. According to the current evidenced based 2020-2025 Dietary Guidelines for Americans, complex carbohydrates are part of a healthy eating pattern which is associated with a decreased risk for type 2 diabetes, cancers, and cardiovascular disease.  Encouraged patient to honor their body's internal hunger and fullness cues.  Throughout the day, check in mentally and rate hunger. Stop eating when satisfied not full regardless of how much food is left on the plate.  Get more if still hungry 20-30 minutes later.  The key is to honor satisfaction so throughout the meal, rate fullness factor and stop when comfortably satisfied not physically full. The key is to honor hunger and fullness without any feelings of guilt or shame.  Pay attention to what the internal cues are, rather than any external factors. This will enhance the confidence you have in listening to your own body and following those internal cues enabling you to increase how often you eat when you are hungry not out of appetite and stop when you are satisfied not full.  Encouraged pt to continue to eat balanced meals inclusive of non starchy vegetables 2 times a day 7 days a week Encouraged pt to choose lean protein sources: limiting beef, pork, sausage, hotdogs, and lunch meat Encourage pt to choose healthy fats such as plant based limiting animal fats Encouraged pt to continue to drink a minium 64 fluid ounces with half being plain water to satisfy proper hydration    Pre-Op Goals Reviewed with the Patient Nutrition: Healthy Eating Behaviors Switch to non-caloric, non-carbonated and non-caffeinated beverages such as  water, unsweetened tea, Crystal Light and zero calorie beverages (aim for 64 oz. per day) Cut out grazing between meals or at night  Find a protein shake you like Eat every 3-5 hours        Eliminate distractions while eating (TV, computer, reading, driving, texting) Take 32-95 minutes to eat a meal   Decrease high sugar foods/decrease high fat/fried foods Eliminate alcoholic beverages Increase protein intake (eggs, fish, chicken, yogurt) before surgery Eat non starchy vegetables 2 times a day 7 days a week Eat complex carbohydrates such as whole grains and fruits   Behavioral Modification: Physical Activity Increase my usual daily activity (use stairs, park farther, etc.) Engage in _walking_  activity  ___15____ minutes ___5___ times per week  Other:    _________________________________________________________________     Problem Solving I will think about my usual eating patterns and how to tweak them How can my friends and family support me Barriers to starting my changes Learn and understand appetite verses hunger   Healthy Coping Allow for ___________ activities per week to help me manage stress Reframe negative thoughts I will keep a picture of someone or something that is my inspiration & look at it daily   Monitoring  Weigh myself once a week  Measure my progress by monitoring how my clothes fit Keep a food record of what I eat and drink for the next ________ (time period) Take pictures of what I eat and drink for the next ________ (time period) Use an app to count steps/day for the next_______ (time period) Measure my progress such as increased energy and more restful sleep Monitor your acid reflux and bowel habits, are they getting better?   *Goals that are bolded indicate the pt would like to start working towards these  Pre-Op Goals Progress & New Goals Continue:  practicing not drinking with your meals or snacks. New: track protein New: increase fluid intake; aim for 64 oz daily.  Handouts Provided Include  Bariatric MyPlate Protein Foods with value Meal Ideas  Learning Style & Readiness for Change Teaching method utilized: Visual & Auditory  Demonstrated degree of understanding via: Teach Back  Readiness Level: preparation Barriers to learning/adherence  to lifestyle change: nothing identified  RD's Notes for next Visit    MONITORING & EVALUATION Dietary intake, weekly physical activity, body weight, and pre-op goals in 1 month.   Next Steps  Pt has completed visits. No further supervised visits required/recommended. Patient is to return to [facility] for pre-op class >3 weeks prior to scheduled surgery.

## 2022-12-05 ENCOUNTER — Other Ambulatory Visit (HOSPITAL_COMMUNITY): Payer: Self-pay

## 2022-12-06 ENCOUNTER — Other Ambulatory Visit: Payer: Self-pay

## 2022-12-11 ENCOUNTER — Other Ambulatory Visit (HOSPITAL_COMMUNITY): Payer: Self-pay

## 2022-12-14 ENCOUNTER — Other Ambulatory Visit (HOSPITAL_COMMUNITY): Payer: Self-pay

## 2022-12-14 MED ORDER — LISDEXAMFETAMINE DIMESYLATE 40 MG PO CAPS
40.0000 mg | ORAL_CAPSULE | Freq: Every morning | ORAL | 0 refills | Status: DC
Start: 2022-12-14 — End: 2023-06-05
  Filled 2023-01-09: qty 30, 30d supply, fill #0

## 2022-12-18 ENCOUNTER — Other Ambulatory Visit (HOSPITAL_COMMUNITY): Payer: Self-pay

## 2022-12-18 ENCOUNTER — Encounter (HOSPITAL_COMMUNITY): Payer: Self-pay

## 2022-12-19 ENCOUNTER — Other Ambulatory Visit (HOSPITAL_COMMUNITY): Payer: Self-pay

## 2022-12-25 ENCOUNTER — Encounter: Payer: BC Managed Care – PPO | Attending: Surgery | Admitting: Dietician

## 2022-12-25 VITALS — Ht 61.0 in | Wt 260.5 lb

## 2022-12-25 DIAGNOSIS — E1136 Type 2 diabetes mellitus with diabetic cataract: Secondary | ICD-10-CM | POA: Insufficient documentation

## 2022-12-25 DIAGNOSIS — Z713 Dietary counseling and surveillance: Secondary | ICD-10-CM | POA: Diagnosis present

## 2022-12-25 DIAGNOSIS — E669 Obesity, unspecified: Secondary | ICD-10-CM

## 2022-12-25 DIAGNOSIS — E119 Type 2 diabetes mellitus without complications: Secondary | ICD-10-CM

## 2022-12-25 NOTE — Progress Notes (Unsigned)
Pre-Operative Nutrition Class:    Patient was seen on 12/25/2022 for Pre-Operative Bariatric Surgery [facility]    Surgery date: 01/23/2023 Surgery type: Sleeve Gastrectomy  Anthropometrics  Start weight at [facility]: 264.4 lbs (date: 11/14/2022)  Height: 61 in Weight today: 260.5 lbs BMI: 49.22 kg/m2    Clinical   Pharmacotherapy: History of weight loss medication used: Ozempic  Medical hx: sleep apnea, obesity, asthma, HTN, T2DM Medications: Ozempic, zofran, lisinopril, vyvanse, xyzal, vit D,  lipitor, imuran, armodafinil, abilify, albuterol, diamox  Labs: A1c 6.7; urea nitrogen 6; CO2 17; platelets 464; creatinine 1.08; BUN/creatinine ratio 7 Notable signs/symptoms: none noted Any previous deficiencies? No  Samples given per MNT protocol. Patient educated on appropriate usage: [employer] Multivitamin Lot # [id number] Exp: 02/27   [employer] Calcium  Lot # [id number] Exp: 03/26/2023   [employer] Vit[employer] Calcium  Lot # [id number] Exp: 08/25  The following the learning objectives were met by the patient during this course: Identify Pre-Op Dietary Goals and will begin 2 weeks pre-operatively Identify appropriate sources of fluids and proteins  State protein recommendations and appropriate sources pre and post-operatively Identify Post-Operative Dietary Goals and will follow for 2 weeks post-operatively Identify appropriate multivitamin and calcium sources Describe the need for physical activity post-operatively and will follow MD recommendations State when to call healthcare provider regarding medication questions or post-operative complications When having a diagnosis of diabetes understanding hypoglycemia symptoms and the inclusion of 1 complex carbohydrate per meal  Handouts given during class include: Pre-Op Bariatric Surgery Diet Handout Protein Shake Handout Post-Op Bariatric Surgery Nutrition Handout BELT Program  Information Flyer Support Group Information Flyer [facility] Bariatric Supplements Price List  Follow-Up Plan: Patient will follow-up at [facility] 2 weeks post operatively for diet advancement per MD.

## 2022-12-26 ENCOUNTER — Encounter: Payer: Self-pay | Admitting: Dietician

## 2022-12-28 ENCOUNTER — Ambulatory Visit: Payer: Self-pay | Admitting: Surgery

## 2022-12-28 DIAGNOSIS — Z01818 Encounter for other preprocedural examination: Secondary | ICD-10-CM

## 2023-01-09 ENCOUNTER — Other Ambulatory Visit (HOSPITAL_COMMUNITY): Payer: Self-pay

## 2023-01-09 ENCOUNTER — Other Ambulatory Visit: Payer: Self-pay

## 2023-01-09 MED ORDER — LEVOCETIRIZINE DIHYDROCHLORIDE 5 MG PO TABS
5.0000 mg | ORAL_TABLET | Freq: Every day | ORAL | 0 refills | Status: DC | PRN
  Filled 2023-01-09 – 2023-02-15 (×2): qty 90, 90d supply, fill #0

## 2023-01-09 MED ORDER — ARIPIPRAZOLE 5 MG PO TABS
5.0000 mg | ORAL_TABLET | Freq: Every day | ORAL | 0 refills | Status: DC
  Filled 2023-01-09 – 2023-02-15 (×2): qty 90, 90d supply, fill #0

## 2023-01-10 NOTE — Progress Notes (Signed)
COVID Vaccine Completed: yes  Date of COVID positive in last 90 days:  PCP - Cindra Presume, PA Cardiologist - Yates Decamp, MD  Cardiac clearance by Yates Decamp, MD 10/19/22 in Epic  Chest x-ray - 08/22/22 Epic EKG - 09/07/22 Epic Stress Test - 10/02/22 Epic ECHO - 10/02/22 Epic Cardiac Cath -  Pacemaker/ICD device last checked: Spinal Cord Stimulator:  Bowel Prep -   Sleep Study -  CPAP -   Fasting Blood Sugar -  Checks Blood Sugar _____ times a day  Last dose of GLP1 agonist-  Ozempic GLP1 instructions:     Last dose of SGLT-2 inhibitors-  N/A SGLT-2 instructions: N/A   Blood Thinner Instructions:  Time Aspirin Instructions: Last Dose:  Activity level:  Can go up a flight of stairs and perform activities of daily living without stopping and without symptoms of chest pain or shortness of breath.  Able to exercise without symptoms  Unable to go up a flight of stairs without symptoms of     Anesthesia review: CP, HTN  Patient denies shortness of breath, fever, cough and chest pain at PAT appointment  Patient verbalized understanding of instructions that were given to them at the PAT appointment. Patient was also instructed that they will need to review over the PAT instructions again at home before surgery.

## 2023-01-10 NOTE — Patient Instructions (Addendum)
SURGICAL WAITING ROOM VISITATION  Patients having surgery or a procedure may have no more than 2 support people in the waiting area - these visitors may rotate.    Children under the age of 39 must have an adult with them who is not the patient.  Due to an increase in RSV and influenza rates and associated hospitalizations, children ages 66 and under may not visit patients in [facility] hospitals.  If the patient needs to stay at the hospital during part of their recovery, the visitor guidelines for inpatient rooms apply. Pre-op nurse will coordinate an appropriate time for 1 support person to accompany patient in pre-op.  This support person may not rotate.    Please refer to the [facility] website for the visitor guidelines for Inpatients (after your surgery is over and you are in a regular room).    Your procedure is scheduled on: 01/23/23   Report to [facility] Main Entrance    Report to admitting at 5:15 AM   Call this number if you have problems the morning of surgery [phone]   MORNING OF SURGERY DRINK:   DRINK 1 G2 drink BEFORE YOU LEAVE HOME, DRINK ALL OF THE  G2 DRINK AT ONE TIME.   NO SOLID FOOD AFTER 600 PM THE NIGHT BEFORE YOUR SURGERY. YOU MAY DRINK CLEAR FLUIDS. THE G2 DRINK YOU DRINK BEFORE YOU LEAVE HOME WILL BE THE LAST FLUIDS YOU DRINK BEFORE SURGERY.  PAIN IS EXPECTED AFTER SURGERY AND WILL NOT BE COMPLETELY ELIMINATED. AMBULATION AND TYLENOL WILL HELP REDUCE INCISIONAL AND GAS PAIN. MOVEMENT IS KEY!  YOU ARE EXPECTED TO BE OUT OF BED WITHIN 4 HOURS OF ADMISSION TO YOUR PATIENT ROOM.  SITTING IN THE RECLINER THROUGHOUT THE DAY IS IMPORTANT FOR DRINKING FLUIDS AND MOVING GAS THROUGHOUT THE GI TRACT.  COMPRESSION STOCKINGS SHOULD BE WORN [facility] STAY UNLESS YOU ARE WALKING.   INCENTIVE SPIROMETER SHOULD BE USED EVERY HOUR WHILE AWAKE TO DECREASE POST-OPERATIVE COMPLICATIONS SUCH AS PNEUMONIA.  WHEN DISCHARGED HOME, IT IS IMPORTANT TO  CONTINUE TO WALK EVERY HOUR AND USE THE INCENTIVE SPIROMETER EVERY HOUR.      You may have the following liquids until 4:30 AM DAY OF SURGERY  Water Non-Citrus Juices (without pulp, NO RED-Apple, White grape, White cranberry) Black Coffee (NO MILK/CREAM OR CREAMERS, sugar ok)  Clear Tea (NO MILK/CREAM OR CREAMERS, sugar ok) regular and decaf                             Plain Jell-O (NO RED)                                           Fruit ices (not with fruit pulp, NO RED)                                     Popsicles (NO RED)                                                               Sports drinks like  Gatorade (NO RED)    The day of surgery:  Drink ONE (1) Pre-Surgery G2 at 4:30 AM the morning of surgery. Drink in one sitting. Do not sip.  This drink was given to you during your hospital  pre-op appointment visit. Nothing else to drink after completing the  Pre-Surgery G2.          If you have questions, please contact your surgeon's office.   FOLLOW BOWEL PREP AND ANY ADDITIONAL PRE OP INSTRUCTIONS YOU RECEIVED FROM YOUR SURGEON'S OFFICE!!!     Oral Hygiene is also important to reduce your risk of infection.                                    Remember - BRUSH YOUR TEETH THE MORNING OF SURGERY WITH YOUR REGULAR TOOTHPASTE  DENTURES WILL BE REMOVED PRIOR TO SURGERY PLEASE DO NOT APPLY "Poly grip" OR ADHESIVES!!!   Stop all vitamins and herbal supplements 7 days before surgery.   Take these medicines the morning of surgery with A SIP OF WATER: Tylenol, Albuterol ,Atorvastatin, Xyzal ,Zofran   DO NOT TAKE ANY ORAL DIABETIC MEDICATIONS DAY OF YOUR SURGERY  How to Manage Your Diabetes Before and After Surgery  Why is it important to control my blood sugar before and after surgery? Improving blood sugar levels before and after surgery helps healing and can limit problems. A way of improving blood sugar control is eating a healthy diet by:  Eating less sugar and  carbohydrates  Increasing activity/exercise  Talking with your doctor about reaching your blood sugar goals High blood sugars (greater than 180 mg/dL) can raise your risk of infections and slow your recovery, so you will need to focus on controlling your diabetes during the weeks before surgery. Make sure that the doctor who takes care of your diabetes knows about your planned surgery including the date and location.  How do I manage my blood sugar before surgery? Check your blood sugar at least 4 times a day, starting 2 days before surgery, to make sure that the level is not too high or low. Check your blood sugar the morning of your surgery when you wake up and every 2 hours until you get to the Short Stay unit. If your blood sugar is less than 70 mg/dL, you will need to treat for low blood sugar: Do not take insulin. Treat a low blood sugar (less than 70 mg/dL) with  cup of clear juice (cranberry or apple), 4 glucose tablets, OR glucose gel. Recheck blood sugar in 15 minutes after treatment (to make sure it is greater than 70 mg/dL). If your blood sugar is not greater than 70 mg/dL on recheck, call [phone] for further instructions. Report your blood sugar to the short stay nurse when you get to Short Stay.  If you are admitted to the hospital after surgery: Your blood sugar will be checked by the staff and you will probably be given insulin after surgery (instead of oral diabetes medicines) to make sure you have good blood sugar levels. The goal for blood sugar control after surgery is 80-180 mg/dL.   WHAT DO I DO ABOUT MY DIABETES MEDICATION?  Do not take oral diabetes medicines (pills) the morning of surgery.  Do not take Ozempic 01/21/23.   DO NOT TAKE THE FOLLOWING 7 DAYS PRIOR TO SURGERY: Ozempic, Wegovy, Rybelsus (Semaglutide), Byetta (exenatide), Bydureon (exenatide ER), Victoza, Saxenda (liraglutide), or  Trulicity (dulaglutide) Mounjaro (Tirzepatide) Adlyxin (Lixisenatide),  Polyethylene Glycol Loxenatide.  Reviewed and Endorsed by [facility] Patient Education Committee, August 2015                              You may not have any metal on your body including hair pins, jewelry, and body piercing             Do not wear make-up, lotions, powders, perfumes, or deodorant  Do not wear nail polish including gel and S&S, artificial/acrylic nails, or any other type of covering on natural nails including finger and toenails. If you have artificial nails, gel coating, etc. that needs to be removed by a nail salon please have this removed prior to surgery or surgery may need to be canceled/ delayed if the surgeon/ anesthesia feels like they are unable to be safely monitored.   Do not shave  48 hours prior to surgery.    Do not bring valuables to the hospital. [facility] IS NOT             RESPONSIBLE   FOR VALUABLES.   Contacts, glasses, dentures or bridgework may not be worn into surgery.   Bring small overnight bag day of surgery.   DO NOT BRING YOUR HOME MEDICATIONS TO THE HOSPITAL. PHARMACY WILL DISPENSE MEDICATIONS LISTED ON YOUR MEDICATION LIST TO YOU DURING YOUR ADMISSION IN THE HOSPITAL!              Please read over the following fact sheets you were given: IF YOU HAVE QUESTIONS ABOUT YOUR PRE-OP INSTRUCTIONS PLEASE CALL [phone] Cook   If you received a COVID test during your pre-op visit  it is requested that you wear a mask when out in public, stay away from anyone that may not be feeling well and notify your surgeon if you develop symptoms. If you test positive for Covid or have been in contact with anyone that has tested positive in the last 10 days please notify you surgeon.    [place] - Preparing for Surgery Before surgery, you can play an important role.  Because skin is not sterile, your skin needs to be as free of germs as possible.  You can reduce the number of germs on your skin by washing with CHG (chlorahexidine gluconate) soap  before surgery.  CHG is an antiseptic cleaner which kills germs and bonds with the skin to continue killing germs even after washing. Please DO NOT use if you have an allergy to CHG or antibacterial soaps.  If your skin becomes reddened/irritated stop using the CHG and inform your nurse when you arrive at Short Stay. Do not shave (including legs and underarms) for at least 48 hours prior to the first CHG shower.  You may shave your face/neck.  Please follow these instructions carefully:  1.  Shower with CHG Soap the night before surgery and the  morning of surgery.  2.  If you choose to wash your hair, wash your hair first as usual with your normal  shampoo.  3.  After you shampoo, rinse your hair and body thoroughly to remove the shampoo.                             4.  Use CHG as you would any other liquid soap.  You can apply chg directly to the skin and wash.  Gently  with a scrungie or clean washcloth.  5.  Apply the CHG Soap to your body ONLY FROM THE NECK DOWN.   Do   not use on face/ open                           Wound or open sores. Avoid contact with eyes, ears mouth and   genitals (private parts).                       Wash face,  Genitals (private parts) with your normal soap.             6.  Wash thoroughly, paying special attention to the area where your    surgery  will be performed.  7.  Thoroughly rinse your body with warm water from the neck down.  8.  DO NOT shower/wash with your normal soap after using and rinsing off the CHG Soap.                9.  Pat yourself dry with a clean towel.            10.  Wear clean pajamas.            11.  Place clean sheets on your bed the night of your first shower and do not  sleep with pets. Day of Surgery : Do not apply any lotions/deodorants the morning of surgery.  Please wear clean clothes to the hospital/surgery center.  FAILURE TO FOLLOW THESE INSTRUCTIONS MAY RESULT IN THE CANCELLATION OF YOUR SURGERY  PATIENT  SIGNATURE_________________________________  NURSE SIGNATURE__________________________________  ________________________________________________________________________ WHAT IS A BLOOD TRANSFUSION? Blood Transfusion Information  A transfusion is the replacement of blood or some of its parts. Blood is made up of multiple cells which provide different functions. Red blood cells carry oxygen and are used for blood loss replacement. White blood cells fight against infection. Platelets control bleeding. Plasma helps clot blood. Other blood products are available for specialized needs, such as hemophilia or other clotting disorders. BEFORE THE TRANSFUSION  Who gives blood for transfusions?  Healthy volunteers who are fully evaluated to make sure their blood is safe. This is blood bank blood. Transfusion therapy is the safest it has ever been in the practice of medicine. Before blood is taken from a donor, a complete history is taken to make sure that person has no history of diseases nor engages in risky social behavior (examples are intravenous drug use or sexual activity with multiple partners). The donor's travel history is screened to minimize risk of transmitting infections, such as malaria. The donated blood is tested for signs of infectious diseases, such as HIV and hepatitis. The blood is then tested to be sure it is compatible with you in order to minimize the chance of a transfusion reaction. If you or a relative donates blood, this is often done in anticipation of surgery and is not appropriate for emergency situations. It takes many days to process the donated blood. RISKS AND COMPLICATIONS Although transfusion therapy is very safe and saves many lives, the main dangers of transfusion include:  Getting an infectious disease. Developing a transfusion reaction. This is an allergic reaction to something in the blood you were given. Every precaution is taken to prevent this. The decision to  have a blood transfusion has been considered carefully by your caregiver before blood is given. Blood is not given unless the benefits outweigh the risks.  AFTER THE TRANSFUSION Right after receiving a blood transfusion, you will usually feel much better and more energetic. This is especially true if your red blood cells have gotten low (anemic). The transfusion raises the level of the red blood cells which carry oxygen, and this usually causes an energy increase. The nurse administering the transfusion will monitor you carefully for complications. HOME CARE INSTRUCTIONS  No special instructions are needed after a transfusion. You may find your energy is better. Speak with your caregiver about any limitations on activity for underlying diseases you may have. SEEK MEDICAL CARE IF:  Your condition is not improving after your transfusion. You develop redness or irritation at the intravenous (IV) site. SEEK IMMEDIATE MEDICAL CARE IF:  Any of the following symptoms occur over the next 12 hours: Shaking chills. You have a temperature by mouth above 102 F (38.9 C), not controlled by medicine. Chest, back, or muscle pain. People around you feel you are not acting correctly or are confused. Shortness of breath or difficulty breathing. Dizziness and fainting. You get a rash or develop hives. You have a decrease in urine output. Your urine turns a dark color or changes to pink, red, or brown. Any of the following symptoms occur over the next 10 days: You have a temperature by mouth above 102 F (38.9 C), not controlled by medicine. Shortness of breath. Weakness after normal activity. The white part of the eye turns yellow (jaundice). You have a decrease in the amount of urine or are urinating less often. Your urine turns a dark color or changes to pink, red, or brown. Document Released: 03/17/2000 Document Revised: 06/12/2011 Document Reviewed: 11/04/2007 [facility] Patient Information 2014  [place], [place].  _______________________________________________________________________

## 2023-01-11 ENCOUNTER — Encounter (HOSPITAL_COMMUNITY)
Admission: RE | Admit: 2023-01-11 | Discharge: 2023-01-11 | Disposition: A | Discharge status: Home / Self Care | Payer: BC Managed Care – PPO | Source: Ambulatory Visit | Attending: Surgery | Admitting: Surgery

## 2023-01-11 ENCOUNTER — Other Ambulatory Visit: Payer: Self-pay

## 2023-01-11 ENCOUNTER — Encounter (HOSPITAL_COMMUNITY): Payer: Self-pay

## 2023-01-11 VITALS — BP 137/97 | HR 72 | Temp 98.7°F | Resp 14 | Ht 61.0 in | Wt 253.0 lb

## 2023-01-11 DIAGNOSIS — E1122 Type 2 diabetes mellitus with diabetic chronic kidney disease: Secondary | ICD-10-CM | POA: Insufficient documentation

## 2023-01-11 DIAGNOSIS — J45909 Unspecified asthma, uncomplicated: Secondary | ICD-10-CM | POA: Diagnosis not present

## 2023-01-11 DIAGNOSIS — D869 Sarcoidosis, unspecified: Secondary | ICD-10-CM | POA: Insufficient documentation

## 2023-01-11 DIAGNOSIS — Z6841 Body Mass Index (BMI) 40.0 and over, adult: Secondary | ICD-10-CM | POA: Diagnosis not present

## 2023-01-11 DIAGNOSIS — Z01812 Encounter for preprocedural laboratory examination: Secondary | ICD-10-CM | POA: Insufficient documentation

## 2023-01-11 DIAGNOSIS — G932 Benign intracranial hypertension: Secondary | ICD-10-CM | POA: Insufficient documentation

## 2023-01-11 DIAGNOSIS — Z01818 Encounter for other preprocedural examination: Secondary | ICD-10-CM

## 2023-01-11 DIAGNOSIS — G4733 Obstructive sleep apnea (adult) (pediatric): Secondary | ICD-10-CM | POA: Diagnosis not present

## 2023-01-11 DIAGNOSIS — E669 Obesity, unspecified: Secondary | ICD-10-CM | POA: Diagnosis present

## 2023-01-11 DIAGNOSIS — Z794 Long term (current) use of insulin: Secondary | ICD-10-CM | POA: Insufficient documentation

## 2023-01-11 DIAGNOSIS — N189 Chronic kidney disease, unspecified: Secondary | ICD-10-CM | POA: Insufficient documentation

## 2023-01-11 HISTORY — DX: Fatty (change of) liver, not elsewhere classified: K76.0

## 2023-01-11 HISTORY — DX: Chronic kidney disease, unspecified: N18.9

## 2023-01-11 HISTORY — DX: Other specified postprocedural states: R11.2

## 2023-01-11 HISTORY — DX: Essential (primary) hypertension: I10

## 2023-01-11 HISTORY — DX: Other specified postprocedural states: Z98.890

## 2023-01-11 HISTORY — DX: Pneumonia, unspecified organism: J18.9

## 2023-01-11 LAB — COMPREHENSIVE METABOLIC PANEL
ALT: 30 U/L (ref 0–44)
AST: 31 U/L (ref 15–41)
Albumin: 4 g/dL (ref 3.5–5.0)
Alkaline Phosphatase: 82 U/L (ref 38–126)
Anion gap: 8 (ref 5–15)
BUN: 13 mg/dL (ref 6–20)
CO2: 24 mmol/L (ref 22–32)
Calcium: 9.1 mg/dL (ref 8.9–10.3)
Chloride: 105 mmol/L (ref 98–111)
Creatinine, Ser: 1.05 mg/dL — ABNORMAL HIGH (ref 0.44–1.00)
GFR, Estimated: >60 mL/min (ref >60)
Glucose, Bld: 85 mg/dL (ref 70–99)
Potassium: 3.9 mmol/L (ref 3.5–5.1)
Sodium: 137 mmol/L (ref 135–145)
Total Bilirubin: 0.6 mg/dL (ref 0.3–1.2)
Total Protein: 7.6 g/dL (ref 6.5–8.1)

## 2023-01-11 LAB — CBC WITH DIFFERENTIAL/PLATELET
Abs Immature Granulocytes: 0.04 10*3/uL (ref 0.00–0.07)
Basophils Absolute: 0 10*3/uL (ref 0.0–0.1)
Basophils Relative: 1 %
Eosinophils Absolute: 0.1 10*3/uL (ref 0.0–0.5)
Eosinophils Relative: 1 %
HCT: 38.9 % (ref 36.0–46.0)
Hemoglobin: 12.6 g/dL (ref 12.0–15.0)
Immature Granulocytes: 1 %
Lymphocytes Relative: 28 %
Lymphs Abs: 2.2 10*3/uL (ref 0.7–4.0)
MCH: 29.2 pg (ref 26.0–34.0)
MCHC: 32.4 g/dL (ref 30.0–36.0)
MCV: 90 fL (ref 80.0–100.0)
Monocytes Absolute: 0.5 10*3/uL (ref 0.1–1.0)
Monocytes Relative: 6 %
Neutro Abs: 5.2 10*3/uL (ref 1.7–7.7)
Neutrophils Relative %: 63 %
Platelets: 392 10*3/uL (ref 150–400)
RBC: 4.32 MIL/uL (ref 3.87–5.11)
RDW: 14.7 % (ref 11.5–15.5)
WBC: 8.1 10*3/uL (ref 4.0–10.5)
nRBC: 0 % (ref 0.0–0.2)

## 2023-01-11 LAB — TYPE AND SCREEN
ABO/RH(D): B POS
Antibody Screen: NEGATIVE

## 2023-01-11 LAB — HEMOGLOBIN A1C
Hgb A1c MFr Bld: 6 % — ABNORMAL HIGH (ref 4.8–5.6)
Mean Plasma Glucose: 125.5 mg/dL

## 2023-01-11 LAB — GLUCOSE, CAPILLARY: Glucose-Capillary: 71 mg/dL (ref 70–99)

## 2023-01-12 ENCOUNTER — Encounter (HOSPITAL_COMMUNITY): Payer: Self-pay

## 2023-01-12 NOTE — Progress Notes (Signed)
DISCUSSION: Denise Cook is a 47 yo female who presents to PAT prior to XI ROBOTIC SLEEVE GASTRECTOMY on 01/23/23 with Dr. Dossie Der. PMH of sarcoidosis, asthma, OSA (uses CPAP), insulin dependent DM, obesity, CKD, pseudotumor cerebri  Prior anesthesia complications include PONV. No complications after EGD in July  Patient was evaluated by Cardiology for chest pain with DOE on 09/07/22. Echo and stress test were ordered which were normal/low risk. Cleared from cardiac perspective:  "Pre-op evaluation I have already sent in preop risk evaluation to surgeon, patient is low risk.  I suspect her chest pain also has exertional component, may suggest noncardiac etiology including GERD which she has overall low risk nuclear stress test."  Patient has been evaluated by Pulmonology on 01/04/2022 for DOE. Thought to be due to OSA vs idiopathic hypersomnia. CPAP compliance emphasized.   Patient is followed by Rheumatology at [facility] for sarcoidosis. Follows with Neurology for ICH. Stable on meds.  VS: BP (!) 137/97   Pulse 72   Temp 37.1 C (Oral)   Resp 14   Ht 5\' 1"  (1.549 m)   Wt 114.8 kg   SpO2 100%   BMI 47.80 kg/m   PROVIDERS: Turmel, Caleb P, PA   LABS: Labs reviewed: Acceptable for surgery. (all labs ordered are listed, but only abnormal results are displayed)  Labs Reviewed  COMPREHENSIVE METABOLIC PANEL - Abnormal; Notable for the following components:      Result Value   Creatinine, Ser 1.05 (*)    All other components within normal limits  HEMOGLOBIN A1C - Abnormal; Notable for the following components:   Hgb A1c MFr Bld 6.0 (*)    All other components within normal limits  CBC WITH DIFFERENTIAL/PLATELET  GLUCOSE, CAPILLARY  TYPE AND SCREEN     IMAGES:   FINDINGS: The heart size and mediastinal contours are within normal limits. There is no evidence of pulmonary edema, consolidation, pneumothorax, nodule or pleural fluid. The visualized skeletal structures are  unremarkable.   IMPRESSION: No active cardiopulmonary disease.     EKG:   CV:  Exercise nuclear stress test 10/12/2022: Myocardial perfusion is normal. Overall LV systolic function is normal without regional wall motion abnormalities. Stress LV EF: 69%. Normal ECG stress. The patient exercised for 4 minutes and 29 seconds of a Bruce protocol, achieving approximately 6.43 METs & 89% MPHR. Accelerated HR response. Reduced exercise tolerance. no symptoms. The blood pressure response was normal. No previous exam available for comparison. Low risk.  Echocardiogram 10/02/2022: Study Quality: Technically difficult study (poor subcostal images, BMI 52). Normal LV systolic function with visual EF 60-65%. Left ventricle cavity is normal in size. Normal left ventricular wall thickness. Normal global wall motion. Normal diastolic filling pattern, normal LAP. Systolic anterior motion of the mitral valve. Peak LVOT gradient 5.2 mmHg. No significant valvular heart disease. No prior study for comparison.  Past Medical History:  Diagnosis Date   Allergy    Asthma    Chronic kidney disease    Depression    Diabetes mellitus    diet control   Fatty liver    Glaucoma    Hypertension    Obesity    Pneumonia    PONV (postoperative nausea and vomiting)    Pseudotumor cerebri    Sarcoidosis 04/03/2010    Past Surgical History:  Procedure Laterality Date   ABDOMINAL HYSTERECTOMY     BIOPSY  06/21/2021   Procedure: BIOPSY;  Surgeon: Kerin Salen, MD;  Location: [facility];  Service: Gastroenterology;;   BIOPSY  10/27/2022   Procedure: BIOPSY;  Surgeon: Quentin Ore, MD;  Location: [facility];  Service: General;;   CARDIAC CATHETERIZATION     CHOLECYSTECTOMY     COLONOSCOPY WITH PROPOFOL N/A 06/21/2021   Procedure: COLONOSCOPY WITH PROPOFOL;  Surgeon: Kerin Salen, MD;  Location: [facility];  Service: Gastroenterology;  Laterality: N/A;   ESOPHAGOGASTRODUODENOSCOPY N/A  10/27/2022   Procedure: ESOPHAGOGASTRODUODENOSCOPY (EGD);  Surgeon: Quentin Ore, MD;  Location: [facility];  Service: General;  Laterality: N/A;   EYE SURGERY Left    Removed vitreous membrane   glaucoma shunt     INCISION AND DRAINAGE Right 05/01/2014   Procedure: INCISION AND DRAINAGE RIGHT INDEX FLEXOR SHEATH  ;  Surgeon: Dairl Ponder, MD;  Location: [facility];  Service: Orthopedics;  Laterality: Right;   left eye surgery     TRIGGER FINGER RELEASE Right     MEDICATIONS:  acetaminophen (TYLENOL) 500 MG tablet   acetaZOLAMIDE ER (DIAMOX) 500 MG capsule   albuterol (VENTOLIN HFA) 108 (90 Base) MCG/ACT inhaler   ARIPiprazole (ABILIFY) 5 MG tablet   Armodafinil 250 MG tablet   atorvastatin (LIPITOR) 10 MG tablet   azaTHIOprine (IMURAN) 50 MG tablet   Blood Glucose Monitoring Suppl (FIFTY50 GLUCOSE METER 2.0) w/Device KIT   brimonidine (ALPHAGAN) 0.2 % ophthalmic solution   Cholecalciferol (VITAMIN D-3) 125 MCG (5000 UT) TABS   dorzolamide-timolol (COSOPT) 2-0.5 % ophthalmic solution   levocetirizine (XYZAL) 5 MG tablet   levocetirizine (XYZAL) 5 MG tablet   lisdexamfetamine (VYVANSE) 40 MG capsule   lisinopril-hydrochlorothiazide (ZESTORETIC) 20-12.5 MG tablet   ondansetron (ZOFRAN) 4 MG tablet   Semaglutide, 2 MG/DOSE, (OZEMPIC, 2 MG/DOSE,) 8 MG/3ML SOPN   tirzepatide (MOUNJARO) 10 MG/0.5ML Pen   No current facility-administered medications for this encounter.    Marcille Blanco [facility] Short Stay/Anesthesiology [facility] Phone [phone] 01/12/2023 2:03 PM

## 2023-01-22 NOTE — Anesthesia Preprocedure Evaluation (Signed)
Anesthesia Evaluation  Patient identified by MRN, date of birth, ID band Patient awake    Reviewed: Allergy & Precautions, H&P , NPO status , Patient's Chart, lab work & pertinent test results  History of Anesthesia Complications (+) PONV and history of anesthetic complications  Airway Mallampati: II  TM Distance: >3 FB Neck ROM: Full    Dental no notable dental hx. (+) Dental Advisory Given, Teeth Intact   Pulmonary asthma    Pulmonary exam normal breath sounds clear to auscultation       Cardiovascular hypertension, Normal cardiovascular exam Rhythm:Regular Rate:Normal     Neuro/Psych  PSYCHIATRIC DISORDERS  Depression Bipolar Disorder   negative neurological ROS     GI/Hepatic negative GI ROS, Neg liver ROS,,,  Endo/Other  diabetes, Type 2, Oral Hypoglycemic Agents  Morbid obesity  Renal/GU Renal disease     Musculoskeletal   Abdominal  (+) + obese  Peds  Hematology negative hematology ROS (+)   Anesthesia Other Findings   Reproductive/Obstetrics negative OB ROS                             Anesthesia Physical Anesthesia Plan  ASA: 3  Anesthesia Plan: General   Post-op Pain Management: Tylenol PO (pre-op)*   Induction: Intravenous  PONV Risk Score and Plan: 4 or greater and Ondansetron, Dexamethasone, Treatment may vary due to age or medical condition, Midazolam, Scopolamine patch - Pre-op and Propofol infusion  Airway Management Planned: Oral ETT  Additional Equipment:   Intra-op Plan:   Post-operative Plan: Extubation in OR  Informed Consent: I have reviewed the patients History and Physical, chart, labs and discussed the procedure including the risks, benefits and alternatives for the proposed anesthesia with the patient or authorized representative who has indicated his/her understanding and acceptance.     Dental advisory given  Plan Discussed with:  CRNA  Anesthesia Plan Comments:        Anesthesia Quick Evaluation

## 2023-01-23 ENCOUNTER — Inpatient Hospital Stay (HOSPITAL_COMMUNITY)
Admission: RE | Admit: 2023-01-23 | Discharge: 2023-01-24 | DRG: 621 | Disposition: A | Discharge status: Home / Self Care | Payer: BC Managed Care – PPO | Attending: Surgery | Admitting: Surgery

## 2023-01-23 ENCOUNTER — Inpatient Hospital Stay (HOSPITAL_COMMUNITY): Payer: BC Managed Care – PPO | Admitting: Anesthesiology

## 2023-01-23 ENCOUNTER — Inpatient Hospital Stay (HOSPITAL_COMMUNITY): Payer: Self-pay | Admitting: Anesthesiology

## 2023-01-23 ENCOUNTER — Other Ambulatory Visit: Payer: Self-pay

## 2023-01-23 ENCOUNTER — Encounter (HOSPITAL_COMMUNITY): Payer: Self-pay | Admitting: Surgery

## 2023-01-23 ENCOUNTER — Encounter (HOSPITAL_COMMUNITY)
Admission: RE | Disposition: A | Discharge status: Home / Self Care | Payer: Self-pay | Source: Home / Self Care | Attending: Surgery

## 2023-01-23 ENCOUNTER — Other Ambulatory Visit (HOSPITAL_COMMUNITY): Payer: Self-pay

## 2023-01-23 DIAGNOSIS — H4089 Other specified glaucoma: Secondary | ICD-10-CM | POA: Diagnosis present

## 2023-01-23 DIAGNOSIS — F32A Depression, unspecified: Secondary | ICD-10-CM | POA: Diagnosis present

## 2023-01-23 DIAGNOSIS — K449 Diaphragmatic hernia without obstruction or gangrene: Secondary | ICD-10-CM | POA: Diagnosis present

## 2023-01-23 DIAGNOSIS — Z794 Long term (current) use of insulin: Secondary | ICD-10-CM

## 2023-01-23 DIAGNOSIS — Z23 Encounter for immunization: Secondary | ICD-10-CM

## 2023-01-23 DIAGNOSIS — G932 Benign intracranial hypertension: Secondary | ICD-10-CM | POA: Diagnosis present

## 2023-01-23 DIAGNOSIS — Z888 Allergy status to other drugs, medicaments and biological substances status: Secondary | ICD-10-CM | POA: Diagnosis not present

## 2023-01-23 DIAGNOSIS — D869 Sarcoidosis, unspecified: Secondary | ICD-10-CM | POA: Diagnosis present

## 2023-01-23 DIAGNOSIS — J45909 Unspecified asthma, uncomplicated: Secondary | ICD-10-CM | POA: Diagnosis present

## 2023-01-23 DIAGNOSIS — K760 Fatty (change of) liver, not elsewhere classified: Secondary | ICD-10-CM | POA: Diagnosis present

## 2023-01-23 DIAGNOSIS — Z833 Family history of diabetes mellitus: Secondary | ICD-10-CM | POA: Diagnosis not present

## 2023-01-23 DIAGNOSIS — Z6841 Body Mass Index (BMI) 40.0 and over, adult: Secondary | ICD-10-CM | POA: Diagnosis not present

## 2023-01-23 DIAGNOSIS — Z79899 Other long term (current) drug therapy: Secondary | ICD-10-CM | POA: Diagnosis not present

## 2023-01-23 DIAGNOSIS — Z91018 Allergy to other foods: Secondary | ICD-10-CM | POA: Diagnosis not present

## 2023-01-23 DIAGNOSIS — E119 Type 2 diabetes mellitus without complications: Secondary | ICD-10-CM | POA: Diagnosis present

## 2023-01-23 DIAGNOSIS — Z90710 Acquired absence of both cervix and uterus: Secondary | ICD-10-CM | POA: Diagnosis not present

## 2023-01-23 DIAGNOSIS — Z7985 Long-term (current) use of injectable non-insulin antidiabetic drugs: Secondary | ICD-10-CM

## 2023-01-23 DIAGNOSIS — I129 Hypertensive chronic kidney disease with stage 1 through stage 4 chronic kidney disease, or unspecified chronic kidney disease: Secondary | ICD-10-CM | POA: Diagnosis present

## 2023-01-23 DIAGNOSIS — Z01818 Encounter for other preprocedural examination: Secondary | ICD-10-CM

## 2023-01-23 HISTORY — PX: UPPER GI ENDOSCOPY: SHX6162

## 2023-01-23 LAB — GLUCOSE, CAPILLARY
Glucose-Capillary: 108 mg/dL — ABNORMAL HIGH (ref 70–99)
Glucose-Capillary: 217 mg/dL — ABNORMAL HIGH (ref 70–99)
Glucose-Capillary: 271 mg/dL — ABNORMAL HIGH (ref 70–99)
Glucose-Capillary: 236 mg/dL — ABNORMAL HIGH (ref 70–99)
Glucose-Capillary: 182 mg/dL — ABNORMAL HIGH (ref 70–99)

## 2023-01-23 LAB — CBC
HCT: 41.1 % (ref 36.0–46.0)
Hemoglobin: 13.1 g/dL (ref 12.0–15.0)
MCH: 29.6 pg (ref 26.0–34.0)
MCHC: 31.9 g/dL (ref 30.0–36.0)
MCV: 92.8 fL (ref 80.0–100.0)
Platelets: 386 10*3/uL (ref 150–400)
RBC: 4.43 MIL/uL (ref 3.87–5.11)
RDW: 14.6 % (ref 11.5–15.5)
WBC: 16.9 10*3/uL — ABNORMAL HIGH (ref 4.0–10.5)
nRBC: 0 % (ref 0.0–0.2)

## 2023-01-23 LAB — POCT PREGNANCY, URINE: Preg Test, Ur: NEGATIVE

## 2023-01-23 LAB — CREATININE, SERUM
Creatinine, Ser: 1.18 mg/dL — ABNORMAL HIGH (ref 0.44–1.00)
GFR, Estimated: 57 mL/min — ABNORMAL LOW (ref >60)

## 2023-01-23 LAB — ABO/RH: ABO/RH(D): B POS

## 2023-01-23 SURGERY — XI ROBOTIC GASTRIC SLEEVE RESECTION
Anesthesia: General

## 2023-01-23 MED ORDER — CHLORHEXIDINE GLUCONATE 0.12 % MT SOLN
15.0000 mL | Freq: Once | OROMUCOSAL | Status: AC
  Administered 2023-01-23: 15 mL via OROMUCOSAL

## 2023-01-23 MED ORDER — HYDRALAZINE HCL 20 MG/ML IJ SOLN: 10.0000 mg | INTRAMUSCULAR | Status: DC | PRN

## 2023-01-23 MED ORDER — ALBUTEROL SULFATE (2.5 MG/3ML) 0.083% IN NEBU: 2.5000 mg | INHALATION_SOLUTION | RESPIRATORY_TRACT | Status: DC | PRN

## 2023-01-23 MED ORDER — BRIMONIDINE TARTRATE 0.2 % OP SOLN
1.0000 [drp] | Freq: Two times a day (BID) | OPHTHALMIC | Status: DC
Start: 2023-01-23 — End: 2023-01-24
  Administered 2023-01-23 – 2023-01-24 (×2): 1 [drp] via OPHTHALMIC
  Filled 2023-01-23: qty 5

## 2023-01-23 MED ORDER — DEXAMETHASONE SODIUM PHOSPHATE 10 MG/ML IJ SOLN
INTRAMUSCULAR | Status: DC | PRN
  Administered 2023-01-23: 4 mg via INTRAVENOUS

## 2023-01-23 MED ORDER — ARMODAFINIL 250 MG PO TABS
250.0000 mg | ORAL_TABLET | Freq: Every morning | ORAL | Status: DC
  Filled 2023-01-23: qty 1

## 2023-01-23 MED ORDER — MORPHINE SULFATE (PF) 2 MG/ML IV SOLN: 1.0000 mg | INTRAVENOUS | Status: DC | PRN

## 2023-01-23 MED ORDER — SUGAMMADEX SODIUM 200 MG/2ML IV SOLN
INTRAVENOUS | Status: DC | PRN
  Administered 2023-01-23: 230 mg via INTRAVENOUS

## 2023-01-23 MED ORDER — KETAMINE HCL 50 MG/5ML IJ SOSY
PREFILLED_SYRINGE | INTRAMUSCULAR | Status: AC
  Filled 2023-01-23: qty 5

## 2023-01-23 MED ORDER — HEPARIN SODIUM (PORCINE) 5000 UNIT/ML IJ SOLN
5000.0000 [IU] | Freq: Three times a day (TID) | INTRAMUSCULAR | Status: DC
  Administered 2023-01-23 – 2023-01-24 (×3): 5000 [IU] via SUBCUTANEOUS
  Filled 2023-01-23 (×3): qty 1

## 2023-01-23 MED ORDER — ENOXAPARIN (LOVENOX) PATIENT EDUCATION KIT
PACK | Freq: Once | Status: AC
  Filled 2023-01-23: qty 1

## 2023-01-23 MED ORDER — LISDEXAMFETAMINE DIMESYLATE 20 MG PO CAPS: 40.0000 mg | ORAL_CAPSULE | Freq: Every morning | ORAL | Status: DC

## 2023-01-23 MED ORDER — LACTATED RINGERS IV SOLN: INTRAVENOUS | Status: DC

## 2023-01-23 MED ORDER — FENTANYL CITRATE (PF) 100 MCG/2ML IJ SOLN
INTRAMUSCULAR | Status: DC | PRN
  Administered 2023-01-23: 100 ug via INTRAVENOUS

## 2023-01-23 MED ORDER — DEXAMETHASONE SODIUM PHOSPHATE 10 MG/ML IJ SOLN
INTRAMUSCULAR | Status: AC
  Filled 2023-01-23: qty 1

## 2023-01-23 MED ORDER — ONDANSETRON HCL 4 MG/2ML IJ SOLN
INTRAMUSCULAR | Status: AC
  Filled 2023-01-23: qty 2

## 2023-01-23 MED ORDER — MIDAZOLAM HCL 2 MG/2ML IJ SOLN
INTRAMUSCULAR | Status: AC
  Filled 2023-01-23: qty 2

## 2023-01-23 MED ORDER — ONDANSETRON HCL 4 MG/2ML IJ SOLN
4.0000 mg | INTRAMUSCULAR | Status: DC | PRN
  Administered 2023-01-23 – 2023-01-24 (×3): 4 mg via INTRAVENOUS
  Filled 2023-01-23 (×3): qty 2

## 2023-01-23 MED ORDER — ACETAMINOPHEN 500 MG PO TABS: 1000.0000 mg | ORAL_TABLET | ORAL | Status: DC

## 2023-01-23 MED ORDER — BUPIVACAINE LIPOSOME 1.3 % IJ SUSP: 20.0000 mL | Freq: Once | INTRAMUSCULAR | Status: DC

## 2023-01-23 MED ORDER — STERILE WATER FOR IRRIGATION IR SOLN
Status: DC | PRN
  Administered 2023-01-23: 500 mL

## 2023-01-23 MED ORDER — ACETAMINOPHEN 500 MG PO TABS: 1000.0000 mg | ORAL_TABLET | Freq: Three times a day (TID) | ORAL | Status: DC

## 2023-01-23 MED ORDER — PROPOFOL 10 MG/ML IV BOLUS
INTRAVENOUS | Status: AC
  Filled 2023-01-23: qty 20

## 2023-01-23 MED ORDER — ACETAMINOPHEN 160 MG/5ML PO SOLN
1000.0000 mg | Freq: Three times a day (TID) | ORAL | Status: DC
  Administered 2023-01-23 – 2023-01-24 (×4): 1000 mg via ORAL
  Filled 2023-01-23 (×4): qty 40.6

## 2023-01-23 MED ORDER — FENTANYL CITRATE (PF) 100 MCG/2ML IJ SOLN
INTRAMUSCULAR | Status: AC
  Filled 2023-01-23: qty 2

## 2023-01-23 MED ORDER — METOPROLOL TARTRATE 5 MG/5ML IV SOLN: 5.0000 mg | Freq: Four times a day (QID) | INTRAVENOUS | Status: DC | PRN

## 2023-01-23 MED ORDER — SODIUM CHLORIDE 0.9 % IV SOLN
2.0000 g | INTRAVENOUS | Status: AC
  Administered 2023-01-23: 2 g via INTRAVENOUS
  Filled 2023-01-23: qty 2

## 2023-01-23 MED ORDER — PANTOPRAZOLE SODIUM 40 MG IV SOLR
40.0000 mg | Freq: Every day | INTRAVENOUS | Status: DC
  Administered 2023-01-23: 40 mg via INTRAVENOUS
  Filled 2023-01-23: qty 10

## 2023-01-23 MED ORDER — SCOPOLAMINE 1 MG/3DAYS TD PT72
1.0000 | MEDICATED_PATCH | TRANSDERMAL | Status: DC
  Administered 2023-01-23: 1.5 mg via TRANSDERMAL
  Filled 2023-01-23: qty 1

## 2023-01-23 MED ORDER — PROPOFOL 10 MG/ML IV BOLUS
INTRAVENOUS | Status: DC | PRN
  Administered 2023-01-23: 150 mg via INTRAVENOUS

## 2023-01-23 MED ORDER — MEPERIDINE HCL 50 MG/ML IJ SOLN: 6.2500 mg | INTRAMUSCULAR | Status: DC | PRN

## 2023-01-23 MED ORDER — ROCURONIUM BROMIDE 10 MG/ML (PF) SYRINGE
PREFILLED_SYRINGE | INTRAVENOUS | Status: DC | PRN
  Administered 2023-01-23: 70 mg via INTRAVENOUS
  Administered 2023-01-23: 20 mg via INTRAVENOUS

## 2023-01-23 MED ORDER — KETAMINE HCL 10 MG/ML IJ SOLN
INTRAMUSCULAR | Status: DC | PRN
  Administered 2023-01-23: 50 mg via INTRAVENOUS

## 2023-01-23 MED ORDER — MIDAZOLAM HCL 2 MG/2ML IJ SOLN
INTRAMUSCULAR | Status: DC | PRN
  Administered 2023-01-23: 2 mg via INTRAVENOUS

## 2023-01-23 MED ORDER — HEPARIN SODIUM (PORCINE) 5000 UNIT/ML IJ SOLN
5000.0000 [IU] | INTRAMUSCULAR | Status: AC
  Administered 2023-01-23: 5000 [IU] via SUBCUTANEOUS
  Filled 2023-01-23: qty 1

## 2023-01-23 MED ORDER — DEXTROSE IN LACTATED RINGERS 5 % IV SOLN: INTRAVENOUS | Status: AC

## 2023-01-23 MED ORDER — SIMETHICONE 80 MG PO CHEW: 80.0000 mg | CHEWABLE_TABLET | Freq: Four times a day (QID) | ORAL | Status: DC | PRN

## 2023-01-23 MED ORDER — ENSURE MAX PROTEIN PO LIQD
2.0000 [oz_av] | ORAL | Status: DC
  Administered 2023-01-24 (×5): 2 [oz_av] via ORAL

## 2023-01-23 MED ORDER — CHLORHEXIDINE GLUCONATE CLOTH 2 % EX PADS: 6.0000 | MEDICATED_PAD | Freq: Once | CUTANEOUS | Status: DC

## 2023-01-23 MED ORDER — AZATHIOPRINE 50 MG PO TABS
200.0000 mg | ORAL_TABLET | Freq: Every day | ORAL | Status: DC
  Administered 2023-01-23: 200 mg via ORAL
  Filled 2023-01-23 (×2): qty 4

## 2023-01-23 MED ORDER — HYDROMORPHONE HCL 1 MG/ML IJ SOLN: 0.2500 mg | INTRAMUSCULAR | Status: DC | PRN

## 2023-01-23 MED ORDER — OXYCODONE HCL 5 MG/5ML PO SOLN
5.0000 mg | Freq: Four times a day (QID) | ORAL | Status: DC | PRN
Start: 2023-01-23 — End: 2023-01-24

## 2023-01-23 MED ORDER — LIDOCAINE 2% (20 MG/ML) 5 ML SYRINGE
INTRAMUSCULAR | Status: DC | PRN
  Administered 2023-01-23: 100 mg via INTRAVENOUS

## 2023-01-23 MED ORDER — PROCHLORPERAZINE MALEATE 5 MG PO TABS
5.0000 mg | ORAL_TABLET | Freq: Four times a day (QID) | ORAL | Status: DC | PRN
  Filled 2023-01-23: qty 1

## 2023-01-23 MED ORDER — BUPIVACAINE-EPINEPHRINE (PF) 0.25% -1:200000 IJ SOLN
INTRAMUSCULAR | Status: DC | PRN
  Administered 2023-01-23: 50 mL

## 2023-01-23 MED ORDER — ROCURONIUM BROMIDE 10 MG/ML (PF) SYRINGE
PREFILLED_SYRINGE | INTRAVENOUS | Status: AC
Start: 2023-01-23 — End: ?
  Filled 2023-01-23: qty 10

## 2023-01-23 MED ORDER — APREPITANT 40 MG PO CAPS
40.0000 mg | ORAL_CAPSULE | ORAL | Status: AC
  Administered 2023-01-23: 40 mg via ORAL
  Filled 2023-01-23: qty 1

## 2023-01-23 MED ORDER — INSULIN ASPART 100 UNIT/ML IJ SOLN
0.0000 [IU] | INTRAMUSCULAR | Status: DC | PRN
Start: 2023-01-23 — End: 2023-01-23
  Administered 2023-01-23: 8 [IU] via SUBCUTANEOUS

## 2023-01-23 MED ORDER — INFLUENZA VIRUS VACC SPLIT PF (FLUZONE) 0.5 ML IM SUSY
0.5000 mL | PREFILLED_SYRINGE | INTRAMUSCULAR | Status: AC
  Administered 2023-01-24: 0.5 mL via INTRAMUSCULAR
  Filled 2023-01-23: qty 0.5

## 2023-01-23 MED ORDER — DROPERIDOL 2.5 MG/ML IJ SOLN: 0.6250 mg | Freq: Once | INTRAMUSCULAR | Status: DC | PRN

## 2023-01-23 MED ORDER — BUPIVACAINE-EPINEPHRINE 0.25% -1:200000 IJ SOLN
INTRAMUSCULAR | Status: AC
  Filled 2023-01-23: qty 1

## 2023-01-23 MED ORDER — PHENYLEPHRINE 80 MCG/ML (10ML) SYRINGE FOR IV PUSH (FOR BLOOD PRESSURE SUPPORT)
PREFILLED_SYRINGE | INTRAVENOUS | Status: DC | PRN
  Administered 2023-01-23: 80 ug via INTRAVENOUS
  Administered 2023-01-23: 40 ug via INTRAVENOUS
  Administered 2023-01-23 (×5): 80 ug via INTRAVENOUS

## 2023-01-23 MED ORDER — LEVOCETIRIZINE DIHYDROCHLORIDE 5 MG PO TABS: 5.0000 mg | ORAL_TABLET | Freq: Every morning | ORAL | Status: DC

## 2023-01-23 MED ORDER — BUPIVACAINE LIPOSOME 1.3 % IJ SUSP
INTRAMUSCULAR | Status: AC
  Filled 2023-01-23: qty 20

## 2023-01-23 MED ORDER — ARIPIPRAZOLE 5 MG PO TABS
5.0000 mg | ORAL_TABLET | Freq: Every day | ORAL | Status: DC
  Administered 2023-01-23: 5 mg via ORAL
  Filled 2023-01-23 (×2): qty 1

## 2023-01-23 MED ORDER — DORZOLAMIDE HCL-TIMOLOL MAL 2-0.5 % OP SOLN
1.0000 [drp] | Freq: Two times a day (BID) | OPHTHALMIC | Status: DC
Start: 2023-01-23 — End: 2023-01-24
  Administered 2023-01-23 – 2023-01-24 (×2): 1 [drp] via OPHTHALMIC
  Filled 2023-01-23: qty 10

## 2023-01-23 MED ORDER — LORATADINE 10 MG PO TABS
10.0000 mg | ORAL_TABLET | Freq: Every day | ORAL | Status: DC
  Administered 2023-01-24: 10 mg via ORAL
  Filled 2023-01-23: qty 1

## 2023-01-23 MED ORDER — ORAL CARE MOUTH RINSE: 15.0000 mL | Freq: Once | OROMUCOSAL | Status: AC

## 2023-01-23 MED ORDER — ACETAMINOPHEN 500 MG PO TABS
1000.0000 mg | ORAL_TABLET | Freq: Once | ORAL | Status: AC
  Administered 2023-01-23: 1000 mg via ORAL
  Filled 2023-01-23: qty 2

## 2023-01-23 SURGICAL SUPPLY — 73 items
ADH SKN CLS APL DERMABOND .7 (GAUZE/BANDAGES/DRESSINGS) ×1
ANTIFOG SOL W/FOAM PAD STRL (MISCELLANEOUS) ×1
APL PRP STRL LF DISP 70% ISPRP (MISCELLANEOUS) ×2
APPLIER CLIP 5 13 M/L LIGAMAX5 (MISCELLANEOUS)
APPLIER CLIP ROT 10 11.4 M/L (STAPLE)
APR CLP MED LRG 11.4X10 (STAPLE)
APR CLP MED LRG 5 ANG JAW (MISCELLANEOUS)
BAG COUNTER SPONGE SURGICOUNT (BAG) ×2 IMPLANT
BAG SPNG CNTER NS LX DISP (BAG) ×1
BLADE SURG SZ11 CARB STEEL (BLADE) ×2 IMPLANT
CANNULA REDUCER 12-8 DVNC XI (CANNULA) ×2 IMPLANT
CHLORAPREP W/TINT 26 (MISCELLANEOUS) ×4 IMPLANT
CLIP APPLIE 5 13 M/L LIGAMAX5 (MISCELLANEOUS) IMPLANT
CLIP APPLIE ROT 10 11.4 M/L (STAPLE) IMPLANT
COVER SURGICAL LIGHT HANDLE (MISCELLANEOUS) ×2 IMPLANT
COVER TIP SHEARS 8 DVNC (MISCELLANEOUS) IMPLANT
DERMABOND ADVANCED .7 DNX12 (GAUZE/BANDAGES/DRESSINGS) ×2 IMPLANT
DRAPE ARM DVNC X/XI (DISPOSABLE) ×8 IMPLANT
DRAPE COLUMN DVNC XI (DISPOSABLE) ×2 IMPLANT
DRIVER NDL LRG 8 DVNC XI (INSTRUMENTS) ×2 IMPLANT
DRIVER NDL MEGA SUTCUT DVNCXI (INSTRUMENTS) ×2 IMPLANT
DRIVER NDLE LRG 8 DVNC XI (INSTRUMENTS)
DRIVER NDLE MEGA SUTCUT DVNCXI (INSTRUMENTS) ×1
ELECT REM PT RETURN 15FT ADLT (MISCELLANEOUS) ×2 IMPLANT
FORCEPS BPLR FENES DVNC XI (FORCEP) IMPLANT
GAUZE 4X4 16PLY ~~LOC~~+RFID DBL (SPONGE) ×2 IMPLANT
GLOVE BIO SURGEON STRL SZ7.5 (GLOVE) ×4 IMPLANT
GLOVE INDICATOR 8.0 STRL GRN (GLOVE) ×4 IMPLANT
GOWN STRL REUS W/ TWL XL LVL3 (GOWN DISPOSABLE) ×4 IMPLANT
GOWN STRL REUS W/TWL XL LVL3 (GOWN DISPOSABLE) ×2
GRASPER SUT TROCAR 14GX15 (MISCELLANEOUS) ×2 IMPLANT
GRASPER TIP-UP FEN DVNC XI (INSTRUMENTS) ×2 IMPLANT
HEMOSTAT SNOW SURGICEL 2X4 (HEMOSTASIS) IMPLANT
IRRIG SUCT STRYKERFLOW 2 WTIP (MISCELLANEOUS)
IRRIGATION SUCT STRKRFLW 2 WTP (MISCELLANEOUS) ×2 IMPLANT
IRRIGATOR SUCT 8 DISP DVNC XI (IRRIGATION / IRRIGATOR) IMPLANT
KIT BASIN OR (CUSTOM PROCEDURE TRAY) ×2 IMPLANT
KIT TURNOVER KIT A (KITS) IMPLANT
LUBRICANT JELLY K Y 4OZ (MISCELLANEOUS) IMPLANT
MARKER SKIN DUAL TIP RULER LAB (MISCELLANEOUS) IMPLANT
MAT PREVALON FULL STRYKER (MISCELLANEOUS) ×2 IMPLANT
NDL SPNL 18GX3.5 QUINCKE PK (NEEDLE) ×2 IMPLANT
NEEDLE SPNL 18GX3.5 QUINCKE PK (NEEDLE) ×1
OBTURATOR OPTICAL STND 8 DVNC (TROCAR) ×1
OBTURATOR OPTICALSTD 8 DVNC (TROCAR) ×2 IMPLANT
PACK CARDIOVASCULAR III (CUSTOM PROCEDURE TRAY) ×2 IMPLANT
RELOAD STAPLE 60 2.5 WHT DVNC (STAPLE) IMPLANT
RELOAD STAPLE 60 3.5 BLU DVNC (STAPLE) IMPLANT
SCISSORS LAP 5X35 DISP (ENDOMECHANICALS) IMPLANT
SCISSORS MNPLR CVD DVNC XI (INSTRUMENTS) ×2 IMPLANT
SEAL UNIV 5-12 XI (MISCELLANEOUS) ×8 IMPLANT
SEALER VESSEL EXT DVNC XI (MISCELLANEOUS) ×2 IMPLANT
SET TUBE SMOKE EVAC HIGH FLOW (TUBING) ×2 IMPLANT
SLEEVE GASTRECTOMY 40FR VISIGI (MISCELLANEOUS) IMPLANT
SOL ELECTROSURG ANTI STICK (MISCELLANEOUS) ×1
SOLUTION ANTFG W/FOAM PAD STRL (MISCELLANEOUS) ×2 IMPLANT
SOLUTION ELECTROSURG ANTI STCK (MISCELLANEOUS) ×2 IMPLANT
SPIKE FLUID TRANSFER (MISCELLANEOUS) ×2 IMPLANT
STAPLER 60 SUREFORM DVNC (STAPLE) ×2 IMPLANT
STAPLER RELOAD 2.5X60 WHT DVNC (STAPLE) ×5
STAPLER RELOAD 3.5X60 BLU DVNC (STAPLE) ×1
SUT MNCRL AB 4-0 PS2 18 (SUTURE) ×4 IMPLANT
SUT SILK 0 SH 30 (SUTURE) IMPLANT
SUT VIC AB 0 CT1 27 (SUTURE) ×1
SUT VIC AB 0 CT1 27XBRD ANTBC (SUTURE) ×2 IMPLANT
SUT VIC AB 2-0 SH 27 (SUTURE) ×1
SUT VIC AB 2-0 SH 27XBRD (SUTURE) ×2 IMPLANT
SYR 20ML LL LF (SYRINGE) ×2 IMPLANT
TOWEL OR 17X26 10 PK STRL BLUE (TOWEL DISPOSABLE) ×2 IMPLANT
TRAY FOLEY MTR SLVR 16FR STAT (SET/KITS/TRAYS/PACK) IMPLANT
TROCAR ADV FIXATION 12X100MM (TROCAR) IMPLANT
TROCAR Z-THREAD FIOS 5X100MM (TROCAR) ×2 IMPLANT
TUBE CALIBRATION LAPBAND (TUBING) IMPLANT

## 2023-01-23 NOTE — Op Note (Signed)
Patient: Denise Cook (Jun 26, 1975, [id number])  Date of Surgery: 01/23/2023  Preoperative Diagnosis: MORBID OBESITY   Postoperative Diagnosis: MORBID OBESITY   Surgical Procedure: XI ROBOTIC SLEEVE GASTRECTOMY and HIATAL HERNIA REPAIR: 43775 (CPT) UPPER GI ENDOSCOPY: [id number]   Operative Team Members:  Surgeons and Role:    * Broedy Osbourne, [name], MD - Primary    * Gaynelle Adu, MD - Assisting   Anesthesiologist: Lewie Loron, MD CRNA: Elisabeth Cara, CRNA; Sindy Guadeloupe, CRNA   Anesthesia: General   Fluids:  Total I/O In: 1015 [I.V.:915; IV Piggyback:100] Out: 20 [Blood:20]  Complications: None  Drains:  none   Specimen:  ID Type Source Tests Collected by Time Destination  1 : Stomach Tissue PATH GI benign resection SURGICAL PATHOLOGY Denise Cook, [name], MD 01/23/2023 [phone]      Disposition:  PACU - hemodynamically stable.  Plan of Care: Admit for overnight observation    Indications for Procedure: Denise Cook is a 47 y.o. female who is seen for bariatric surgery consultation. The patient has morbid obesity with a BMI of Body mass index is 47 kg/m. and the following conditions related to obesity: diabetes, pseudotumor cerebri.  Today we discussed the surgical options to treat obesity and its associated comorbidity. After discussing the available procedures in the region, we discussed in great detail the surgeries I offer: robotic sleeve gastrectomy and robotic roux-en-y gastric bypass. We discussed the procedures themselves as well as their risks, benefits and alternatives. I entered the patient's basic information into the [facility] Metabolic Surgery Risk/Benefit Calculator to facilitate this discussion.  I also entered the patient's information into the [facility] Individualized Metabolic Surgery Score calculator to help explain surgery's effect on Type 2 Diabetes.   After a full discussion and all questions answered, the  patient is interested in pursuing a robotic sleeve gastrectomy with upper endoscopy.  She completed the bariatric surgery preoperative pathway to include the following: - Bloodwork - Dietician consult - Cardiology preoperative clearance - Psychology evaluation - Upper endoscopy with biopsy.   Today she presents for surgery.  We again discussed the procedure, its risks, benefits and alternatives and the patient granted consent to proceed.  We will proceed as scheduled.     Findings: fat containing hiatal hernia posterior to the esophagus  Infection status: Patient: Private Patient Elective Case Case: Elective Infection Present At Time Of Surgery (PATOS): None   Description of Procedure:   On the date stated above, the patient was taken to the operating room suite and placed in supine positioning.  General endotracheal anesthesia was induced.  A timeout was completed verifying the correct patient, procedure, positioning and equipment needed for the case.  The patient's abdomen was prepped and draped in the usual sterile fashion.  I entered the patient's right upper quadrant using a 5 mm trocar in the optical technique.  There was no trauma to underlying viscera with initial trocar placement.  The abdomen was insufflated 15 mmHg.  A total of 4 robotic trochars were placed across the mid abdomen, including the 5 mm initial trocar being upsized to a 8 mm trocar.  The robotic stapler trocar was placed in the number two position.  The [facility] liver retractor was placed through the subxiphoid region and under the left lobe of the liver and was connected to the rail of the bed.  A TAP block was placed using marcaine and Exparel under direct vision of the laparoscope.  The [employer] XI robotic platform was docked  and we transitioned to robotic surgery.   Using the tip up fenestrated grasper, fenestrated bipolar, 30 degree camera and Vessel Sealer from the patient's right to left, we began by  dissecting the angle of His off the left crus of the diaphragm.  The adhesions between the stomach, spleen and diaphragm were divided using the Vessel Sealer to define the angle of His.    During this portion of the dissection, a hiatal hernia was identified and repaired.    The gastrohepatic ligament was divided and the right crus was identified.  A blunt dissection was carried out between the right crus and the esophagus.  The phrenoesophageal ligament was divided and dissection was carried up over the top of the esophagus towards the left crus.  The fundus was partially mobilized off of the left hemidiaphragm.    All posterior gastric attachments to the lesser sac and retroperitoneum were divided.  The left crus was further delineated.  A retroesophageal window was created and used to for esophageal retraction.    A high, circumferential mediastinal dissection was performed in an effort to mobilize the esophagus and provide for adequate intraabdominal esophageal length.  The mediastinal dissection was performed bluntly, with little to no thermal energy.  The anterior and posterior vagus nerves were both identified and preserved.  The crural defect was reapproximated with multiple, interrupted, 0 Ethibond sutures.  The crural pillars came together well without tearing of the adjacent diaphragmatic tissue.     I then started 6 cm away from the pylorus along the greater curve the stomach and divided the gastroepiploic vessels and the gastrocolic ligament.  The lesser sac was entered.  There were adhesions to the posterior wall of the stomach which were divided.  The greater curve was mobilized working superiorly toward the spleen.  All of the gastroepiploic and short gastric vessels were divided as we divided the gastrocolic and gastrosplenic ligaments.  As we reached the splenic hilum, I lifted the stomach anteriorly.  I created a tunnel between the stomach and its attachments to the retroperitoneum  posteriorly just to the left of the GE junction until I encountered the left crus and my previous angle of His dissection.  We then were able to approach the shortest of the short gastrics both from the greater curve the stomach laterally and from the left crus medially.  These were divided using the Vessel Sealer and the fundus of the stomach was fully mobilized.  With the stomach fully mobilized we direct our attention to stapling.  A 40 French VISI G was inserted into the stomach and positioned along the lesser curve the stomach and suction was applied.  The 60 mm robotic sureform linear stapler was used to create the sleeve gastrectomy.  We started 6 cm from the pylorus and were careful to avoid narrowing at the incisura.  We stayed about 1 cm away from the GE junction to protect the sling fibers.  We used one blue and many white loads of the linear stapler.  With the sleeve gastrectomy completed the VISI G was taken off suction and removed and we performed an upper endoscopy.  The intra-abdominal pressure was decreased to to check hemostasis.  The foregut was submerged in saline irrigation and the adult upper endoscope was inserted into the stomach as far as the pylorus to inspect the sleeve.  The sleeve appeared appropriately oriented without any twisting.  There was good hemostasis.  The sleeve was widely patent at the incisura with  no narrowing.  There was no significant retained fundus.  The sleeve was inflated with the endoscope and there was no bubbling of the irrigation, suggesting a negative leak test and a airtight sleeve gastrectomy.  The foregut was decompressed with the endoscope and the endoscope was removed.  An omentopexy was performed, using running 2-0 vicryl suture to connect the inferior two staple lines to the divided omentum. There was good hemostasis at the end of the case.  The robot was undocked and moved away from the field.  The sleeve gastrectomy specimen was removed from the  stapler port.  The fascia of the stapler port was closed using a 0 Vicryl on a PMI suture passer.   The liver retractor was removed under direct vision.  The pneumoperitoneum was evacuated.  The skin was closed using 4-0 Monocryl and Dermabond.  All sponge and needle counts were correct at the end of the case.    Ivar Drape, MD General, Bariatric, & Minimally Invasive Surgery [facility], [place]

## 2023-01-23 NOTE — Transfer of Care (Signed)
Immediate Anesthesia Transfer of Care Note  Patient: Denise Cook  Procedure(s) Performed: XI ROBOTIC SLEEVE GASTRECTOMY and HIATAL HERNIA REPAIR UPPER GI ENDOSCOPY  Patient Location: PACU  Anesthesia Type:General  Level of Consciousness: awake, drowsy  Airway & Oxygen Therapy: Patient Spontanous Breathing  Post-op Assessment: Report given to RN and Post -op Vital signs reviewed and stable  Post vital signs: Reviewed, stable  Last Vitals:  Vitals Value Taken Time  BP 137/81 01/23/23 0954  Temp    Pulse 93 01/23/23 0956  Resp 22 01/23/23 0956  SpO2 97 % 01/23/23 0956  Vitals shown include unfiled device data.  Last Pain:  Vitals:   01/23/23 0605  TempSrc:   PainSc: 0-No pain      Patients Stated Pain Goal: 5 (01/23/23 1610)  Complications: No notable events documented.

## 2023-01-23 NOTE — Anesthesia Procedure Notes (Signed)
Procedure Name: Intubation Date/Time: 01/23/2023 7:47 AM  Performed by: Sindy Guadeloupe, CRNAPre-anesthesia Checklist: Patient identified, Emergency Drugs available, Suction available, Patient being monitored and Timeout performed Patient Re-evaluated:Patient Re-evaluated prior to induction Oxygen Delivery Method: Circle system utilized Preoxygenation: Pre-oxygenation with 100% oxygen Induction Type: IV induction Ventilation: Mask ventilation without difficulty Laryngoscope Size: Mac and 4 Grade View: Grade I Tube type: Oral Tube size: 7.0 mm Number of attempts: 1 Airway Equipment and Method: Stylet Placement Confirmation: ETT inserted through vocal cords under direct vision, positive ETCO2 and breath sounds checked- equal and bilateral Secured at: 21 cm Tube secured with: Tape Dental Injury: Teeth and Oropharynx as per pre-operative assessment

## 2023-01-23 NOTE — H&P (Signed)
Admitting Physician: [name] Letisia Schwalb  Service: Bariatric surgery  CC: Obesity  Subjective   HPI: Denise Cook is an 47 y.o. female who is here for sleeve  Past Medical History:  Diagnosis Date   Allergy    Asthma    Chronic kidney disease    Depression    Diabetes mellitus    diet control   Fatty liver    Glaucoma    Hypertension    Obesity    Pneumonia    PONV (postoperative nausea and vomiting)    Pseudotumor cerebri    Sarcoidosis 04/03/2010    Past Surgical History:  Procedure Laterality Date   ABDOMINAL HYSTERECTOMY     BIOPSY  06/21/2021   Procedure: BIOPSY;  Surgeon: Kerin Salen, MD;  Location: [facility];  Service: Gastroenterology;;   BIOPSY  10/27/2022   Procedure: BIOPSY;  Surgeon: Quentin Ore, MD;  Location: [facility];  Service: General;;   CARDIAC CATHETERIZATION     CHOLECYSTECTOMY     COLONOSCOPY WITH PROPOFOL N/A 06/21/2021   Procedure: COLONOSCOPY WITH PROPOFOL;  Surgeon: Kerin Salen, MD;  Location: [facility];  Service: Gastroenterology;  Laterality: N/A;   ESOPHAGOGASTRODUODENOSCOPY N/A 10/27/2022   Procedure: ESOPHAGOGASTRODUODENOSCOPY (EGD);  Surgeon: Quentin Ore, MD;  Location: [facility];  Service: General;  Laterality: N/A;   EYE SURGERY Left    Removed vitreous membrane   glaucoma shunt     INCISION AND DRAINAGE Right 05/01/2014   Procedure: INCISION AND DRAINAGE RIGHT INDEX FLEXOR SHEATH  ;  Surgeon: Dairl Ponder, MD;  Location: [facility];  Service: Orthopedics;  Laterality: Right;   left eye surgery     TRIGGER FINGER RELEASE Right     Family History  Adopted: Yes  Problem Relation Age of Onset   Alcoholism Mother    Cancer Maternal Grandmother    Diabetes Paternal Grandmother    Healthy Daughter     Social:  reports that she has never smoked. She has never been exposed to tobacco smoke. She has never used smokeless tobacco. She reports that she does not drink alcohol and  does not use drugs.  Allergies:  Allergies  Allergen Reactions   Methotrexate Other (See Comments)    Unknown reaction   Other Other (See Comments)    PEPPER sriracha turned tongue black    Metformin Rash   Metformin And Related Itching    Medications: Current Outpatient Medications  Medication Instructions   acetaminophen (TYLENOL) 1,000 mg, Oral, Every 6 hours PRN, Reported on 07/06/2015   acetaZOLAMIDE ER (DIAMOX) 500 mg, Oral, 2 times daily   albuterol (VENTOLIN HFA) 108 (90 Base) MCG/ACT inhaler Inhale 1-2 puffs into the lungs every 4 (four) hours as needed for wheezing   ARIPiprazole (ABILIFY) 5 mg, Oral, Daily at bedtime   Armodafinil 250 MG tablet Take 1 tablet (250 mg total) by mouth daily in the morning.   atorvastatin (LIPITOR) 10 MG tablet Take 1/2 tablet (5 mg total) by mouth daily.   azaTHIOprine (IMURAN) 200 mg, Oral, Daily   Blood Glucose Monitoring Suppl (FIFTY50 GLUCOSE METER 2.0) w/Device KIT USE TO CHECK BLOOD SUGAR ONCE DAILY. ALTERNATING MORNINGS AND EVENINGS BEFORE MEALS   brimonidine (ALPHAGAN) 0.2 % ophthalmic solution 1 drop, Both Eyes, 2 times daily   dorzolamide-timolol (COSOPT) 2-0.5 % ophthalmic solution 1 drop, Both Eyes, 2 times daily   levocetirizine (XYZAL) 5 MG tablet Take 1 tablet (5 mg total) by mouth in the morning as needed for allergies  levocetirizine (XYZAL) 5 mg, Oral, Daily PRN   lisinopril-hydrochlorothiazide (ZESTORETIC) 20-12.5 MG tablet 1 tablet, Oral, BH-each morning   Mounjaro 10 mg, Subcutaneous, Weekly   ondansetron (ZOFRAN) 4 MG tablet Take 1 tablet (4 mg total) by mouth every 8 (eight) hours as needed for nausea   Ozempic (2 MG/DOSE) 2 mg, Subcutaneous, Weekly   Vitamin D-3 5,000 Units, Oral, Daily   Vyvanse 40 mg, Oral, Every morning    ROS - all of the below systems have been reviewed with the patient and positives are indicated with bold text General: chills, fever or night sweats Eyes: blurry vision or double vision ENT:  epistaxis or sore throat Allergy/Immunology: itchy/watery eyes or nasal congestion Hematologic/Lymphatic: bleeding problems, blood clots or swollen lymph nodes Endocrine: temperature intolerance or unexpected weight changes Breast: new or changing breast lumps or nipple discharge Resp: cough, shortness of breath, or wheezing CV: chest pain or dyspnea on exertion GI: as per HPI GU: dysuria, trouble voiding, or hematuria MSK: joint pain or joint stiffness Neuro: TIA or stroke symptoms Derm: pruritus and skin lesion changes Psych: anxiety and depression  Objective   PE Blood pressure (!) 121/93, pulse 77, temperature 98.1 F (36.7 C), temperature source Oral, resp. rate 15, height 5\' 1"  (1.549 m), weight 112.9 kg, SpO2 97%. Constitutional: NAD; conversant; no deformities Eyes: Moist conjunctiva; no lid lag; anicteric; PERRL Neck: Trachea midline; no thyromegaly Lungs: Normal respiratory effort; no tactile fremitus CV: RRR; no palpable thrills; no pitting edema GI: Abd Soft, nontender; no palpable hepatosplenomegaly MSK: Normal range of motion of extremities; no clubbing/cyanosis Psychiatric: Appropriate affect; alert and oriented x3 Lymphatic: No palpable cervical or axillary lymphadenopathy  Results for orders placed or performed during the hospital encounter of 01/23/23 (from the past 24 hour(s))  Glucose, capillary     Status: Abnormal   Collection Time: 01/23/23  5:44 AM  Result Value Ref Range   Glucose-Capillary 108 (H) 70 - 99 mg/dL  Pregnancy, urine POC     Status: None   Collection Time: 01/23/23  5:46 AM  Result Value Ref Range   Preg Test, Ur NEGATIVE NEGATIVE    Imaging Orders  No imaging studies ordered today     Assessment and Plan   Denise Cook is a 47 y.o. female who is seen for bariatric surgery consultation. The patient has morbid obesity with a BMI of Body mass index is 47 kg/m. and the following conditions related to obesity: diabetes,  pseudotumor cerebri.  Today we discussed the surgical options to treat obesity and its associated comorbidity. After discussing the available procedures in the region, we discussed in great detail the surgeries I offer: robotic sleeve gastrectomy and robotic roux-en-y gastric bypass. We discussed the procedures themselves as well as their risks, benefits and alternatives. I entered the patient's basic information into the [facility] Metabolic Surgery Risk/Benefit Calculator to facilitate this discussion.  I also entered the patient's information into the [facility] Individualized Metabolic Surgery Score calculator to help explain surgery's effect on Type 2 Diabetes.   After a full discussion and all questions answered, the patient is interested in pursuing a robotic sleeve gastrectomy with upper endoscopy.  She completed the bariatric surgery preoperative pathway to include the following: - Bloodwork - Dietician consult - Cardiology preoperative clearance - Psychology evaluation - Upper endoscopy with biopsy.  Today she presents for surgery.  We again discussed the procedure, its risks, benefits and alternatives and the patient granted consent to proceed.  We will proceed  as scheduled.     Quentin Ore, MD  [facility] Use [email or web address] to contact on call provider

## 2023-01-23 NOTE — TOC Benefit Eligibility Note (Signed)
Patient Product/process development scientist completed.    The patient is insured through [employer]. Patient has [employer], may use a copay card, and/or apply for patient assistance if available.    Ran test claim for enoxaparin (Lovenox) 40 mg/0.4 ml inj and the current 30 day co-pay is $0.00.   This test claim was processed through [facility]- copay amounts may vary at other pharmacies due to pharmacy/plan contracts, or as the patient moves through the different stages of their insurance plan.     Roland Earl, CPHT Pharmacy Technician III Certified Patient Advocate [facility] Patient Advocate Team Direct Number: [phone]  Fax: [phone]

## 2023-01-23 NOTE — Anesthesia Postprocedure Evaluation (Signed)
Anesthesia Post Note  Patient: Denise Cook  Procedure(s) Performed: XI ROBOTIC SLEEVE GASTRECTOMY and HIATAL HERNIA REPAIR UPPER GI ENDOSCOPY     Patient location during evaluation: PACU Anesthesia Type: General Level of consciousness: sedated and patient cooperative Pain management: pain level controlled Vital Signs Assessment: post-procedure vital signs reviewed and stable Respiratory status: spontaneous breathing Cardiovascular status: stable Anesthetic complications: no   No notable events documented.  Last Vitals:  Vitals:   01/23/23 1354 01/23/23 1456  BP: (!) 132/94 (!) 126/91  Pulse: (!) 109 (!) 107  Resp: 20 20  Temp: (!) 36.3 C (!) 36.4 C  SpO2: 98% 99%    Last Pain:  Vitals:   01/23/23 1456  TempSrc: Oral  PainSc:                  Lewie Loron

## 2023-01-23 NOTE — Plan of Care (Signed)
[facility] Tonsillectomy/Adenoidectomy, Postoperative PEDS care plan entered in error.

## 2023-01-23 NOTE — Progress Notes (Signed)
PHARMACY CONSULT FOR:  Risk Assessment for Post-Discharge VTE Following Bariatric Surgery  Post-Discharge VTE Risk Assessment: This patient's probability of 30-day post-discharge VTE is increased due to the factors marked: x Sleeve gastrectomy  x Liver disorder (transplant, cirrhosis, or nonalcoholic steatohepatitis)   Hx of VTE   Hemorrhage requiring transfusion   GI perforation, leak, or obstruction   ====================================================    Female    Age >/=60 years    BMI >/=50 kg/m2    CHF    Dyspnea at Rest    Paraplegia  x  Non-gastric-band surgery    Operation Time >/=3 hr    Return to OR     Length of Stay >/= 3 d   Hypercoagulable condition   Significant venous stasis    Predicted probability of 30-day post-discharge VTE: n/a. Patient ruled in for post-discharge prophylaxis based on the presence of 2 or more risk factors for portomesenteric vein thrombosis.   Other patient-specific factors to consider: Discussed with Dr. Dossie Der - confirmed that NASH will be included as PMH for risk factor.   BMI = 47  Recommendation for Discharge: Enoxaparin 40 mg [place] q12h x 30 days post-discharge  Denise Cook is a 47 y.o. female who underwent sleeve gastrectomy on 01/23/23   Case start: 0809 Case end: 0940   Allergies  Allergen Reactions   Methotrexate Other (See Comments)    Unknown reaction   Other Other (See Comments)    PEPPER sriracha turned tongue black    Latex Itching    Patient states "When I wear latex gloves for long periods of time, it'll make my skin itch"   Metformin Rash   Metformin And Related Itching    Patient Measurements: Height: 5\' 1"  (154.9 cm) Weight: 112.9 kg (249 lb) IBW/kg (Calculated) : 47.8 Body mass index is 47.05 kg/m.  Recent Labs    01/23/23 1418  WBC 16.9*  HGB 13.1  HCT 41.1  PLT 386  CREATININE 1.18*   Estimated Creatinine Clearance: 67.9 mL/min (A) (by C-G formula based on SCr of 1.18 mg/dL  (H)).    Past Medical History:  Diagnosis Date   Allergy    Asthma    Chronic kidney disease    Depression    Diabetes mellitus    diet control   Fatty liver    Glaucoma    Hypertension    Obesity    Pneumonia    PONV (postoperative nausea and vomiting)    Pseudotumor cerebri    Sarcoidosis 04/03/2010     Medications Prior to Admission  Medication Sig Dispense Refill Last Dose   acetaZOLAMIDE ER (DIAMOX) 500 MG capsule Take 1 capsule (500 mg total) by mouth 2 (two) times daily. 180 capsule 3 01/22/2023   ARIPiprazole (ABILIFY) 5 MG tablet Take 1 tablet (5 mg total) by mouth at bedtime. 90 tablet 0 01/22/2023   Armodafinil 250 MG tablet Take 1 tablet (250 mg total) by mouth daily in the morning. 30 tablet 5 01/22/2023   azaTHIOprine (IMURAN) 50 MG tablet Take 4 tablets (200 mg total) by mouth daily. (Patient taking differently: Take 200 mg by mouth at bedtime.) 360 tablet 3 01/22/2023   Blood Glucose Monitoring Suppl (FIFTY50 GLUCOSE METER 2.0) w/Device KIT USE TO CHECK BLOOD SUGAR ONCE DAILY. ALTERNATING MORNINGS AND EVENINGS BEFORE MEALS   Past Month   brimonidine (ALPHAGAN) 0.2 % ophthalmic solution Place 1 drop into both eyes 2 (two) times daily. (Patient taking differently: Place 1 drop into the left eye 2 (  two) times daily.) 30 mL 3 Past Month   Cholecalciferol (VITAMIN D-3) 125 MCG (5000 UT) TABS Take 5,000 Units by mouth daily.   01/22/2023   dorzolamide-timolol (COSOPT) 2-0.5 % ophthalmic solution Place 1 drop into both eyes 2 (two) times daily.   Past Month   levocetirizine (XYZAL) 5 MG tablet Take 1 tablet (5 mg total) by mouth in the morning as needed for allergies 90 tablet 0 01/22/2023   levocetirizine (XYZAL) 5 MG tablet Take 1 tablet (5 mg total) by mouth daily as needed. 90 tablet 0 01/22/2023   lisdexamfetamine (VYVANSE) 40 MG capsule Take 1 capsule (40 mg total) by mouth in the morning. 30 capsule 0 01/22/2023   Semaglutide, 2 MG/DOSE, (OZEMPIC, 2 MG/DOSE,) 8  MG/3ML SOPN Inject 2 mg into the skin once a week. 3 mL 1 01/14/2023   acetaminophen (TYLENOL) 500 MG tablet Take 1,000 mg by mouth every 6 (six) hours as needed for headache. Reported on 07/06/2015   More than a month   albuterol (VENTOLIN HFA) 108 (90 Base) MCG/ACT inhaler Inhale 1-2 puffs into the lungs every 4 (four) hours as needed for wheezing 6.7 g 2 More than a month   atorvastatin (LIPITOR) 10 MG tablet Take 1/2 tablet (5 mg total) by mouth daily. 45 tablet 1 More than a month   lisinopril-hydrochlorothiazide (ZESTORETIC) 20-12.5 MG tablet Take 1 tablet by mouth every morning. (Patient not taking: Reported on 01/02/2023) 30 tablet 2 Not Taking   ondansetron (ZOFRAN) 4 MG tablet Take 1 tablet (4 mg total) by mouth every 8 (eight) hours as needed for nausea 30 tablet 2 More than a month   tirzepatide (MOUNJARO) 10 MG/0.5ML Pen Inject 10 mg into the skin once a week. (Patient not taking: Reported on 01/02/2023) 2 mL 0 Not Taking    Cindi Carbon, PharmD 01/23/2023,2:51 PM

## 2023-01-24 ENCOUNTER — Other Ambulatory Visit (HOSPITAL_COMMUNITY): Payer: Self-pay

## 2023-01-24 ENCOUNTER — Encounter (HOSPITAL_COMMUNITY): Payer: Self-pay | Admitting: Surgery

## 2023-01-24 ENCOUNTER — Other Ambulatory Visit: Payer: Self-pay

## 2023-01-24 LAB — CBC WITH DIFFERENTIAL/PLATELET
Abs Immature Granulocytes: 0.06 10*3/uL (ref 0.00–0.07)
Basophils Absolute: 0 10*3/uL (ref 0.0–0.1)
Basophils Relative: 0 %
Eosinophils Absolute: 0 10*3/uL (ref 0.0–0.5)
Eosinophils Relative: 0 %
HCT: 34 % — ABNORMAL LOW (ref 36.0–46.0)
Hemoglobin: 11.1 g/dL — ABNORMAL LOW (ref 12.0–15.0)
Immature Granulocytes: 0 %
Lymphocytes Relative: 12 %
Lymphs Abs: 1.8 10*3/uL (ref 0.7–4.0)
MCH: 29.2 pg (ref 26.0–34.0)
MCHC: 32.6 g/dL (ref 30.0–36.0)
MCV: 89.5 fL (ref 80.0–100.0)
Monocytes Absolute: 0.8 10*3/uL (ref 0.1–1.0)
Monocytes Relative: 6 %
Neutro Abs: 11.5 10*3/uL — ABNORMAL HIGH (ref 1.7–7.7)
Neutrophils Relative %: 82 %
Platelets: 366 10*3/uL (ref 150–400)
RBC: 3.8 MIL/uL — ABNORMAL LOW (ref 3.87–5.11)
RDW: 14.7 % (ref 11.5–15.5)
WBC: 14.2 10*3/uL — ABNORMAL HIGH (ref 4.0–10.5)
nRBC: 0 % (ref 0.0–0.2)

## 2023-01-24 LAB — SURGICAL PATHOLOGY

## 2023-01-24 MED ORDER — ENOXAPARIN SODIUM 40 MG/0.4ML IJ SOSY
40.0000 mg | PREFILLED_SYRINGE | Freq: Two times a day (BID) | INTRAMUSCULAR | 0 refills | Status: AC
Start: 2023-01-24 — End: 2023-02-24
  Filled 2023-01-24: qty 24, 30d supply, fill #0

## 2023-01-24 MED ORDER — OXYCODONE HCL 5 MG PO TABS
5.0000 mg | ORAL_TABLET | Freq: Four times a day (QID) | ORAL | 0 refills | Status: DC | PRN
  Filled 2023-01-24: qty 10, 3d supply, fill #0

## 2023-01-24 MED ORDER — MODAFINIL 200 MG PO TABS
200.0000 mg | ORAL_TABLET | Freq: Every day | ORAL | Status: DC
  Administered 2023-01-24: 200 mg via ORAL
  Filled 2023-01-24: qty 1

## 2023-01-24 MED ORDER — GABAPENTIN 100 MG PO CAPS
100.0000 mg | ORAL_CAPSULE | Freq: Two times a day (BID) | ORAL | 0 refills | Status: AC
  Filled 2023-01-24: qty 10, 5d supply, fill #0

## 2023-01-24 MED ORDER — HYDRALAZINE HCL 20 MG/ML IJ SOLN: 10.0000 mg | INTRAMUSCULAR | Status: DC | PRN

## 2023-01-24 MED ORDER — ENOXAPARIN (LOVENOX) PATIENT EDUCATION KIT
1.0000 | PACK | Freq: Once | 0 refills | Status: AC
  Filled 2023-01-24: qty 1, 1d supply, fill #0

## 2023-01-24 MED ORDER — PANTOPRAZOLE SODIUM 40 MG PO TBEC
40.0000 mg | DELAYED_RELEASE_TABLET | Freq: Every day | ORAL | 0 refills | Status: DC
  Filled 2023-01-24: qty 90, 90d supply, fill #0

## 2023-01-24 MED ORDER — ACETAMINOPHEN 500 MG PO TABS: 1000.0000 mg | ORAL_TABLET | Freq: Three times a day (TID) | ORAL | Status: AC

## 2023-01-24 MED ORDER — ONDANSETRON 4 MG PO TBDP
4.0000 mg | ORAL_TABLET | Freq: Four times a day (QID) | ORAL | 0 refills | Status: DC | PRN
  Filled 2023-01-24: qty 18, 5d supply, fill #0
  Filled 2023-02-15: qty 18, 5d supply, fill #1
  Filled 2023-03-16: qty 4, 1d supply, fill #2

## 2023-01-24 NOTE — Plan of Care (Signed)
  Problem: Education: Goal: Knowledge of General Education information will improve Description: Including pain rating scale, medication(s)/side effects and non-pharmacologic comfort measures Outcome: Adequate for Discharge   Problem: Health Behavior/Discharge Planning: Goal: Ability to manage health-related needs will improve Outcome: Adequate for Discharge   Problem: Clinical Measurements: Goal: Ability to maintain clinical measurements within normal limits will improve Outcome: Adequate for Discharge Goal: Will remain free from infection Outcome: Adequate for Discharge Goal: Diagnostic test results will improve Outcome: Adequate for Discharge Goal: Respiratory complications will improve Outcome: Adequate for Discharge Goal: Cardiovascular complication will be avoided Outcome: Adequate for Discharge   Problem: Activity: Goal: Risk for activity intolerance will decrease Outcome: Adequate for Discharge   Problem: Nutrition: Goal: Adequate nutrition will be maintained Outcome: Adequate for Discharge   Problem: Coping: Goal: Level of anxiety will decrease Outcome: Adequate for Discharge   Problem: Elimination: Goal: Will not experience complications related to bowel motility Outcome: Adequate for Discharge Goal: Will not experience complications related to urinary retention Outcome: Adequate for Discharge   Problem: Pain Managment: Goal: General experience of comfort will improve Outcome: Adequate for Discharge   Problem: Safety: Goal: Ability to remain free from injury will improve Outcome: Adequate for Discharge   Problem: Skin Integrity: Goal: Risk for impaired skin integrity will decrease Outcome: Adequate for Discharge   Problem: Education: Goal: Ability to state signs and symptoms to report to health care provider will improve Outcome: Adequate for Discharge Goal: Knowledge of the prescribed self-care regimen will improve Outcome: Adequate for  Discharge Goal: Knowledge of discharge needs will improve Outcome: Adequate for Discharge   Problem: Activity: Goal: Ability to tolerate increased activity will improve Outcome: Adequate for Discharge   Problem: Bowel/Gastric: Goal: Gastrointestinal status for postoperative course will improve Outcome: Adequate for Discharge Goal: Occurrences of nausea will decrease Outcome: Adequate for Discharge   Problem: Coping: Goal: Development of coping mechanisms to deal with changes in body function or appearance will improve Outcome: Adequate for Discharge   Problem: Fluid Volume: Goal: Maintenance of adequate hydration will improve Outcome: Adequate for Discharge   Problem: Nutritional: Goal: Nutritional status will improve Outcome: Adequate for Discharge   Problem: Clinical Measurements: Goal: Will show no signs or symptoms of venous thromboembolism Outcome: Adequate for Discharge Goal: Will remain free from infection Outcome: Adequate for Discharge Goal: Will show no signs of GI Leak Outcome: Adequate for Discharge   Problem: Respiratory: Goal: Will regain and/or maintain adequate ventilation Outcome: Adequate for Discharge   Problem: Pain Management: Goal: Pain level will decrease Outcome: Adequate for Discharge   Problem: Skin Integrity: Goal: Demonstration of wound healing without infection will improve Outcome: Adequate for Discharge   

## 2023-01-24 NOTE — Progress Notes (Signed)

## 2023-01-24 NOTE — Progress Notes (Signed)
Progress Note: General Surgery Service   Chief Complaint/Subjective: Lots of nausea yesterday.  Continues this morning but improving.  Pain improving.  Has been up and walking.  Objective: Vital signs in last 24 hours: Temp:  [97.4 F (36.3 C)-98.8 F (37.1 C)] 98.5 F (36.9 C) (10/23 0600) Pulse Rate:  [88-109] 92 (10/23 0600) Resp:  [18-28] 18 (10/23 0600) BP: (122-147)/(72-103) 129/91 (10/23 0600) SpO2:  [94 %-99 %] 96 % (10/23 0600) Last BM Date : 01/23/23  Intake/Output from previous day: 10/22 0701 - 10/23 0700 In: 2268.8 [I.V.:2168.8; IV Piggyback:100] Out: 420 [Urine:400; Blood:20] Intake/Output this shift: No intake/output data recorded.  GI: Abd Soft, incisions tender, c/d/I w/ glue; no palpable hepatosplenomegaly  Lab Results: CBC  Recent Labs    01/23/23 1418 01/24/23 0457  WBC 16.9* 14.2*  HGB 13.1 11.1*  HCT 41.1 34.0*  PLT 386 366   BMET Recent Labs    01/23/23 1418  CREATININE 1.18*   PT/INR No results for input(s): "LABPROT", "INR" in the last 72 hours. ABG No results for input(s): "PHART", "HCO3" in the last 72 hours.  Invalid input(s): "PCO2", "PO2"  Anti-infectives: Anti-infectives (From admission, onward)    Start     Dose/Rate Route Frequency Ordered Stop   01/23/23 0600  cefoTEtan (CEFOTAN) 2 g in sodium chloride 0.9 % 100 mL IVPB        2 g 200 mL/hr over 30 Minutes Intravenous On call to O.R. 01/23/23 0533 01/23/23 0820       Medications: Scheduled Meds:  acetaminophen  1,000 mg Oral Q8H   Or   acetaminophen (TYLENOL) oral liquid 160 mg/5 mL  1,000 mg Oral Q8H   ARIPiprazole  5 mg Oral QHS   azaTHIOprine  200 mg Oral QHS   brimonidine  1 drop Left Eye BID   dorzolamide-timolol  1 drop Both Eyes BID   enoxaparin   Does not apply Once   heparin injection (subcutaneous)  5,000 Units Subcutaneous Q8H   influenza vac split trivalent PF  0.5 mL Intramuscular Tomorrow-1000   loratadine  10 mg Oral Daily   modafinil  200 mg  Oral Q0600   pantoprazole (PROTONIX) IV  40 mg Intravenous QHS   Ensure Max Protein  2 oz Oral Q2H   Continuous Infusions:  dextrose 5% lactated ringers 75 mL/hr at 01/24/23 0518   PRN Meds:.albuterol, hydrALAZINE, metoprolol tartrate, morphine injection, ondansetron (ZOFRAN) IV, oxyCODONE, prochlorperazine, simethicone  Assessment/Plan: s/p Procedure(s): XI ROBOTIC SLEEVE GASTRECTOMY and HIATAL HERNIA REPAIR UPPER GI ENDOSCOPY 01/23/2023  Doing okay after surgery Continue bariatric protocol Possible discharge later today if meeting goals Lovenox at discharge   LOS: 1 day     Quentin Ore, MD  [facility] Use [email or web address] to contact on call provider  Daily Billing: [id number] - post op

## 2023-01-24 NOTE — Progress Notes (Signed)
   01/24/23 1106  TOC Brief Assessment  Insurance and Status Reviewed  Patient has primary care physician Yes  Home environment has been reviewed home with relatives  Prior level of function: independent  Prior/Current Home Services No current home services  Social Determinants of Health Reivew SDOH reviewed no interventions necessary  Readmission risk has been reviewed Yes  Transition of care needs no transition of care needs at this time

## 2023-01-24 NOTE — Plan of Care (Signed)
  Problem: Education: Goal: Knowledge of General Education information will improve Description: Including pain rating scale, medication(s)/side effects and non-pharmacologic comfort measures Outcome: Progressing   Problem: Health Behavior/Discharge Planning: Goal: Ability to manage health-related needs will improve Outcome: Progressing   Problem: Clinical Measurements: Goal: Ability to maintain clinical measurements within normal limits will improve Outcome: Progressing Goal: Will remain free from infection Outcome: Progressing Goal: Diagnostic test results will improve Outcome: Progressing Goal: Respiratory complications will improve Outcome: Progressing Goal: Cardiovascular complication will be avoided Outcome: Progressing   Problem: Activity: Goal: Risk for activity intolerance will decrease Outcome: Progressing   Problem: Nutrition: Goal: Adequate nutrition will be maintained Outcome: Progressing   Problem: Coping: Goal: Level of anxiety will decrease Outcome: Progressing   Problem: Elimination: Goal: Will not experience complications related to bowel motility Outcome: Progressing Goal: Will not experience complications related to urinary retention Outcome: Progressing   Problem: Pain Managment: Goal: General experience of comfort will improve Outcome: Progressing   Problem: Safety: Goal: Ability to remain free from injury will improve Outcome: Progressing   Problem: Skin Integrity: Goal: Risk for impaired skin integrity will decrease Outcome: Progressing   Problem: Education: Goal: Ability to state signs and symptoms to report to health care provider will improve Outcome: Progressing Goal: Knowledge of the prescribed self-care regimen will improve Outcome: Progressing Goal: Knowledge of discharge needs will improve Outcome: Progressing   Problem: Activity: Goal: Ability to tolerate increased activity will improve Outcome: Progressing   Problem:  Bowel/Gastric: Goal: Gastrointestinal status for postoperative course will improve Outcome: Progressing Goal: Occurrences of nausea will decrease Outcome: Progressing   Problem: Coping: Goal: Development of coping mechanisms to deal with changes in body function or appearance will improve Outcome: Progressing   Problem: Fluid Volume: Goal: Maintenance of adequate hydration will improve Outcome: Progressing   Problem: Nutritional: Goal: Nutritional status will improve Outcome: Progressing   Problem: Clinical Measurements: Goal: Will show no signs or symptoms of venous thromboembolism Outcome: Progressing Goal: Will remain free from infection Outcome: Progressing Goal: Will show no signs of GI Leak Outcome: Progressing   Problem: Respiratory: Goal: Will regain and/or maintain adequate ventilation Outcome: Progressing   Problem: Pain Management: Goal: Pain level will decrease Outcome: Progressing   Problem: Skin Integrity: Goal: Demonstration of wound healing without infection will improve Outcome: Progressing   

## 2023-01-24 NOTE — Progress Notes (Signed)
Patient alert and oriented, pain is controlled. Patient is tolerating fluids, advanced to protein shake today, patient is tolerating well. Reviewed Gastric sleeve/bypass discharge instructions with patient and patient is able to articulate understanding. Provided information on BELT program, Support Group, BSTOP-D, and [facility] outpatient pharmacy. Communicated general update of patient status to surgeon. All questions answered. 24hr fluid recall is per hydration protocol, bariatric nurse coordinator to make follow-up phone call within one week.  Lovenox education kit provided and reviewed. Patient receptive.   Thank you,  Lubertha Basque, RN, MSN Bariatric Nurse Coordinator [phone] (office)

## 2023-01-24 NOTE — Progress Notes (Signed)
Reviewed written discharge instructions with patient. All questions answered. Patient verbalized understanding. Discharged via wheelchair with all belongings... in stable condition. 

## 2023-01-25 ENCOUNTER — Other Ambulatory Visit (HOSPITAL_COMMUNITY): Payer: Self-pay

## 2023-01-25 NOTE — Discharge Summary (Addendum)
Patient ID: Denise Cook [id number] y.o. 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   PVD (posterior vitreous detachment), bilateral 09/20/2018   Borderline diabetes 09/20/2018   Paresthesias 09/12/2018   Lipoma of left upper extremity 05/04/2017   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, right eye, mild stage 05/14/2015   Sarcoid 12/18/2014   Encounter for long-term (current) use of high-risk medication 12/18/2014   Uveitic glaucoma of both eyes, 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 11/20/2014   Panuveitis, 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   IIH (idiopathic intracranial hypertension) 04/21/2013   Leg pain, bilateral 04/15/2013   Sprain of ankle 06/24/2011     Discharge Diagnoses:  Patient Active Problem List   Diagnosis Date Noted   Morbid obesity with BMI of 45.0-49.9, adult (HCC) 01/23/2023   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   Type 2 diabetes mellitus (HCC) 08/21/2019   Bipolar 1 disorder (HCC) 04/29/2019   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   PVD (posterior vitreous detachment), bilateral 09/20/2018   Borderline diabetes 09/20/2018   Paresthesias 09/12/2018   Lipoma of left upper extremity 05/04/2017   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, right eye, mild stage 05/14/2015   Sarcoid 12/18/2014   Encounter for long-term (current) use of high-risk medication 12/18/2014   Uveitic glaucoma of both eyes, 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 11/20/2014   Panuveitis, bilateral  11/20/2014   IIH (idiopathic intracranial hypertension) 04/21/2013   Leg pain, bilateral 04/15/2013   Sprain of ankle 06/24/2011    Operations: Procedure(s): XI ROBOTIC SLEEVE GASTRECTOMY and HIATAL HERNIA REPAIR UPPER GI ENDOSCOPY  Admission Condition: good  Discharged Condition: good  Indication for Admission: Obesity  Hospital Course: Robotic sleeve gastrectomy  Consults: None  Significant Diagnostic Studies: None  Treatments: surgery: As above  Disposition: Home  Patient Instructions:  Allergies as of 01/24/2023       Reactions   Methotrexate Other (See Comments)   Unknown reaction   Other Other (See Comments)   PEPPER sriracha turned tongue black    Latex Itching   Patient states "When I wear latex gloves for long periods of time, it'll make my skin itch"   Metformin Rash   Metformin And Related Itching        Medication List     STOP taking these medications    acetaZOLAMIDE ER 500 MG capsule Commonly known as: DIAMOX   lisinopril-hydrochlorothiazide 20-12.5 MG tablet Commonly known as: Zestoretic   Mounjaro 10 MG/0.5ML Pen Generic drug: tirzepatide       TAKE these medications    acetaminophen 500 MG tablet Commonly known as: TYLENOL Take 1,000 mg by mouth every 6 (six) hours as needed for headache. Reported on 07/06/2015 What changed: Another medication with the same name was added. Make sure you understand how and when to take each.   acetaminophen 500 MG tablet Commonly known as: TYLENOL Take 2 tablets (1,000 mg total) by mouth every 8 (eight) hours for 5 days. What changed: You were already taking a medication with the same name, and this prescription was added. Make sure  you understand how and when to take each.   albuterol 108 (90 Base) MCG/ACT inhaler Commonly known as: VENTOLIN HFA Inhale 1-2 puffs into the lungs every 4 (four) hours as needed for wheezing   ARIPiprazole 5 MG tablet Commonly known as: ABILIFY Take 1 tablet (5  mg total) by mouth at bedtime.   Armodafinil 250 MG tablet Take 1 tablet (250 mg total) by mouth daily in the morning.   atorvastatin 10 MG tablet Commonly known as: LIPITOR Take 1/2 tablet (5 mg total) by mouth daily.   azaTHIOprine 50 MG tablet Commonly known as: IMURAN Take 4 tablets (200 mg total) by mouth daily. What changed: when to take this   brimonidine 0.2 % ophthalmic solution Commonly known as: ALPHAGAN Place 1 drop into both eyes 2 (two) times daily. What changed: how to take this   dorzolamide-timolol 2-0.5 % ophthalmic solution Commonly known as: COSOPT Place 1 drop into both eyes 2 (two) times daily.   enoxaparin 40 MG/0.4ML injection Commonly known as: LOVENOX Inject 0.4 mLs (40 mg total) into the skin every 12 (twelve) hours.   Fifty50 Glucose Meter 2.0 w/Device Kit USE TO CHECK BLOOD SUGAR ONCE DAILY. ALTERNATING MORNINGS AND EVENINGS BEFORE MEALS   gabapentin 100 MG capsule Commonly known as: NEURONTIN Take 1 capsule (100 mg total) by mouth every 12 (twelve) hours for 5 days.   levocetirizine 5 MG tablet Commonly known as: XYZAL Take 1 tablet (5 mg total) by mouth in the morning as needed for allergies   levocetirizine 5 MG tablet Commonly known as: XYZAL Take 1 tablet (5 mg total) by mouth daily as needed.   ondansetron 4 MG disintegrating tablet Commonly known as: ZOFRAN-ODT Take 1 tablet (4 mg total) by mouth every 6 (six) hours as needed for nausea or vomiting.   ondansetron 4 MG tablet Commonly known as: ZOFRAN Take 1 tablet (4 mg total) by mouth every 8 (eight) hours as needed for nausea   oxyCODONE 5 MG immediate release tablet Commonly known as: Oxy IR/ROXICODONE Take 1 tablet (5 mg total) by mouth every 6 (six) hours as needed for breakthrough pain or severe pain (pain score 7-10).   Ozempic (2 MG/DOSE) 8 MG/3ML Sopn Generic drug: Semaglutide (2 MG/DOSE) Inject 2 mg into the skin once a week.   pantoprazole 40 MG tablet Commonly  known as: PROTONIX Take 1 tablet (40 mg total) by mouth daily.   Vitamin D-3 125 MCG (5000 UT) Tabs Take 5,000 Units by mouth daily.   Vyvanse 40 MG capsule Generic drug: lisdexamfetamine Take 1 capsule (40 mg total) by mouth in the morning.       ASK your doctor about these medications    enoxaparin Kit Commonly known as: LOVENOX 1 kit by Does not apply route once for 1 dose. Ask about: Should I take this medication?        Activity: no heavy lifting for 4 weeks Diet:  Bariatric diet Wound Care: keep wound clean and dry  Follow-up:  With Dr. Dossie Der  Signed: [name] Yanixan [facility], [place]   01/25/2023, 7:11 AM

## 2023-01-26 ENCOUNTER — Other Ambulatory Visit: Payer: Self-pay | Admitting: General Surgery

## 2023-01-26 ENCOUNTER — Telehealth (HOSPITAL_COMMUNITY): Payer: Self-pay | Admitting: *Deleted

## 2023-01-26 DIAGNOSIS — E119 Type 2 diabetes mellitus without complications: Secondary | ICD-10-CM

## 2023-01-26 NOTE — Telephone Encounter (Signed)
     Name: Denise Cook               Patient MRN: [id number]  S/P Robotic Gastric Sleeve on 10/22  Patient called concerning poor PO intake of approximately 16oz of fluid a day since surgery and feeling very nauseas. Pt stated even the water by it self has been hard to get in and she is not wanting to throw up. Pt also had c/o brown mucous coming up when coughing/spitting.   Patient identified as being in the red zone on the hydration action plan Due to patient symptoms of nausea for greater than 24hrs (with minimal relief from nausea medication - zofran), and less than 32oz of fluid intake. Recommended pt to contact [facility] to notify the surgeon of the concerns and poor fluid intake and keep working towards hydration as able. Pt receptive.   Pt returned callback stating she contacted [facility], still having concerns for po intake and nausea. The bariatric surgeons were contacted to be made aware of the patient symptoms, new orders to be provided to [facility] staff.   Following this encounter, Pt re-contacted to update the pt to look out for further contact from [facility] regarding receiving IV Fluids to help with rehydration.     Thank you,  Lubertha Basque, RN, MSN Bariatric Nurse Coordinator [phone] (office)

## 2023-01-27 ENCOUNTER — Emergency Department (HOSPITAL_BASED_OUTPATIENT_CLINIC_OR_DEPARTMENT_OTHER): Payer: BC Managed Care – PPO | Admitting: Radiology

## 2023-01-27 ENCOUNTER — Encounter (HOSPITAL_BASED_OUTPATIENT_CLINIC_OR_DEPARTMENT_OTHER): Payer: Self-pay

## 2023-01-27 ENCOUNTER — Emergency Department (HOSPITAL_BASED_OUTPATIENT_CLINIC_OR_DEPARTMENT_OTHER): Payer: BC Managed Care – PPO

## 2023-01-27 ENCOUNTER — Emergency Department (HOSPITAL_BASED_OUTPATIENT_CLINIC_OR_DEPARTMENT_OTHER)
Admission: EM | Admit: 2023-01-27 | Discharge: 2023-01-27 | Disposition: A | Discharge status: Home / Self Care | Payer: BC Managed Care – PPO | Attending: Emergency Medicine | Admitting: Emergency Medicine

## 2023-01-27 ENCOUNTER — Other Ambulatory Visit: Payer: Self-pay

## 2023-01-27 DIAGNOSIS — J90 Pleural effusion, not elsewhere classified: Secondary | ICD-10-CM | POA: Insufficient documentation

## 2023-01-27 DIAGNOSIS — U071 COVID-19: Secondary | ICD-10-CM | POA: Diagnosis not present

## 2023-01-27 DIAGNOSIS — J45909 Unspecified asthma, uncomplicated: Secondary | ICD-10-CM | POA: Diagnosis not present

## 2023-01-27 DIAGNOSIS — E119 Type 2 diabetes mellitus without complications: Secondary | ICD-10-CM | POA: Insufficient documentation

## 2023-01-27 DIAGNOSIS — Z9189 Other specified personal risk factors, not elsewhere classified: Secondary | ICD-10-CM | POA: Diagnosis not present

## 2023-01-27 DIAGNOSIS — E876 Hypokalemia: Secondary | ICD-10-CM | POA: Diagnosis not present

## 2023-01-27 DIAGNOSIS — R0602 Shortness of breath: Secondary | ICD-10-CM | POA: Diagnosis present

## 2023-01-27 DIAGNOSIS — Z91040 Latex allergy status: Secondary | ICD-10-CM | POA: Insufficient documentation

## 2023-01-27 LAB — CBC WITH DIFFERENTIAL/PLATELET
Abs Immature Granulocytes: 0.05 10*3/uL (ref 0.00–0.07)
Basophils Absolute: 0 10*3/uL (ref 0.0–0.1)
Basophils Relative: 0 %
Eosinophils Absolute: 0.3 10*3/uL (ref 0.0–0.5)
Eosinophils Relative: 3 %
HCT: 35.9 % — ABNORMAL LOW (ref 36.0–46.0)
Hemoglobin: 12 g/dL (ref 12.0–15.0)
Immature Granulocytes: 1 %
Lymphocytes Relative: 15 %
Lymphs Abs: 1.5 10*3/uL (ref 0.7–4.0)
MCH: 29.5 pg (ref 26.0–34.0)
MCHC: 33.4 g/dL (ref 30.0–36.0)
MCV: 88.2 fL (ref 80.0–100.0)
Monocytes Absolute: 0.7 10*3/uL (ref 0.1–1.0)
Monocytes Relative: 8 %
Neutro Abs: 7.3 10*3/uL (ref 1.7–7.7)
Neutrophils Relative %: 73 %
Platelets: 350 10*3/uL (ref 150–400)
RBC: 4.07 MIL/uL (ref 3.87–5.11)
RDW: 14.8 % (ref 11.5–15.5)
WBC: 9.9 10*3/uL (ref 4.0–10.5)
nRBC: 0 % (ref 0.0–0.2)

## 2023-01-27 LAB — RESP PANEL BY RT-PCR (RSV, FLU A&B, COVID)  RVPGX2
Influenza A by PCR: NEGATIVE
Influenza B by PCR: NEGATIVE
Resp Syncytial Virus by PCR: NEGATIVE
SARS Coronavirus 2 by RT PCR: POSITIVE — AB

## 2023-01-27 LAB — COMPREHENSIVE METABOLIC PANEL
ALT: 37 U/L (ref 0–44)
AST: 19 U/L (ref 15–41)
Albumin: 4.3 g/dL (ref 3.5–5.0)
Alkaline Phosphatase: 52 U/L (ref 38–126)
Anion gap: 14 (ref 5–15)
BUN: 17 mg/dL (ref 6–20)
CO2: 26 mmol/L (ref 22–32)
Calcium: 9.6 mg/dL (ref 8.9–10.3)
Chloride: 99 mmol/L (ref 98–111)
Creatinine, Ser: 0.88 mg/dL (ref 0.44–1.00)
GFR, Estimated: >60 mL/min (ref >60)
Glucose, Bld: 89 mg/dL (ref 70–99)
Potassium: 3.2 mmol/L — ABNORMAL LOW (ref 3.5–5.1)
Sodium: 139 mmol/L (ref 135–145)
Total Bilirubin: 0.5 mg/dL (ref 0.3–1.2)
Total Protein: 7.7 g/dL (ref 6.5–8.1)

## 2023-01-27 LAB — TROPONIN I (HIGH SENSITIVITY)
Troponin I (High Sensitivity): 4 ng/L (ref <18)
Troponin I (High Sensitivity): 4 ng/L (ref <18)

## 2023-01-27 LAB — BRAIN NATRIURETIC PEPTIDE: B Natriuretic Peptide: 56.4 pg/mL (ref 0.0–100.0)

## 2023-01-27 MED ORDER — AMOXICILLIN-POT CLAVULANATE 875-125 MG PO TABS: 1.0000 | ORAL_TABLET | Freq: Two times a day (BID) | ORAL | 0 refills | Status: AC

## 2023-01-27 MED ORDER — LACTATED RINGERS IV BOLUS
1000.0000 mL | Freq: Once | INTRAVENOUS | Status: AC
  Administered 2023-01-27: 1000 mL via INTRAVENOUS

## 2023-01-27 MED ORDER — DOXYCYCLINE HYCLATE 100 MG PO CAPS
100.0000 mg | ORAL_CAPSULE | Freq: Two times a day (BID) | ORAL | 0 refills | Status: AC
Start: 2023-01-27 — End: 2023-02-01

## 2023-01-27 MED ORDER — IOHEXOL 350 MG/ML SOLN
75.0000 mL | Freq: Once | INTRAVENOUS | Status: AC | PRN
  Administered 2023-01-27: 75 mL via INTRAVENOUS

## 2023-01-27 MED ORDER — POTASSIUM CHLORIDE CRYS ER 20 MEQ PO TBCR
40.0000 meq | EXTENDED_RELEASE_TABLET | Freq: Once | ORAL | Status: AC
  Administered 2023-01-27: 40 meq via ORAL
  Filled 2023-01-27: qty 2

## 2023-01-27 MED ORDER — MOLNUPIRAVIR EUA 200MG CAPSULE: 4.0000 | ORAL_CAPSULE | Freq: Two times a day (BID) | ORAL | 0 refills | Status: AC

## 2023-01-27 NOTE — Discharge Instructions (Addendum)
You were seen in the ER today for evaluation of your shortness of breath. You tested positive for COVID. For this, I am going to place you on a medication called molnupiravir. Take as directed. Additionally, we are going to started you on doxycycline and augmentin. Please take as directed. Also, there was a pleural effusion seen on your imaging. This can happen with surgery. It is important to see your primary care provider in the next few days to see if this improves and for further evaluation. Please make sure you are staying well hydrated and drinking plenty of fluids. Please follow up with your surgeon about IV fluids if needed. If you have any concerns, new or worsening symptoms, please return to the nearest ER for re-evaluation.   Get help right away if: You have trouble breathing or get short of breath. You have pain or pressure in your chest. You cannot speak or move any part of your body. You are confused. Your symptoms get worse. These symptoms may be an emergency. Get help right away. Call 911. Do not wait to see if the symptoms will go away. Do not drive yourself to the hospital.

## 2023-01-27 NOTE — ED Notes (Signed)
Patient transported to X-ray 

## 2023-01-27 NOTE — Progress Notes (Addendum)
RT ambulated pt and her SATS stayed 95-98%

## 2023-01-27 NOTE — ED Triage Notes (Signed)
Pt POV from urgent care c/o shortness of breath since Thursday, pt had gastric sleeve surgery 10/22. Pt also c/o tightness in the chest. Urgent care did xray and advised pt to come to ER for possible pneumonia/PE rule out. NAD during triage

## 2023-01-27 NOTE — ED Provider Notes (Signed)
[place] [facility] AT [facility] Provider Note   CSN: [id number] Arrival date & time: 01/27/23  1035     History Chief Complaint  Patient presents with   Chest Pain    Denise Cook is a 47 y.o. female.   Chest Pain      Home Medications Prior to Admission medications   Medication Sig Start Date End Date Taking? Authorizing Provider  acetaminophen (TYLENOL) 500 MG tablet Take 1,000 mg by mouth every 6 (six) hours as needed for headache. Reported on 07/06/2015    [provider]  acetaminophen (TYLENOL) 500 MG tablet Take 2 tablets (1,000 mg total) by mouth every 8 (eight) hours for 5 days. 01/24/23 01/29/23  Stechschulte, [name], MD  albuterol (VENTOLIN HFA) 108 (90 Base) MCG/ACT inhaler Inhale 1-2 puffs into the lungs every 4 (four) hours as needed for wheezing 03/15/21     ARIPiprazole (ABILIFY) 5 MG tablet Take 1 tablet (5 mg total) by mouth at bedtime. 01/09/23     Armodafinil 250 MG tablet Take 1 tablet (250 mg total) by mouth daily in the morning. 11/01/22     atorvastatin (LIPITOR) 10 MG tablet Take 1/2 tablet (5 mg total) by mouth daily. 09/07/22 01/02/23  Yates Decamp, MD  azaTHIOprine (IMURAN) 50 MG tablet Take 4 tablets (200 mg total) by mouth daily. Patient taking differently: Take 200 mg by mouth at bedtime. 10/06/22     Blood Glucose Monitoring Suppl (FIFTY50 GLUCOSE METER 2.0) w/Device KIT USE TO CHECK BLOOD SUGAR ONCE DAILY. ALTERNATING MORNINGS AND EVENINGS BEFORE MEALS 08/20/17   [provider]  brimonidine (ALPHAGAN) 0.2 % ophthalmic solution Place 1 drop into both eyes 2 (two) times daily. Patient taking differently: Place 1 drop into the left eye 2 (two) times daily. 07/14/22     Cholecalciferol (VITAMIN D-3) 125 MCG (5000 UT) TABS Take 5,000 Units by mouth daily.    [provider]  dorzolamide-timolol (COSOPT) 2-0.5 % ophthalmic solution Place 1 drop into both eyes 2 (two) times daily. 08/12/19   [provider]  enoxaparin (LOVENOX) 40 MG/0.4ML injection Inject 0.4 mLs (40 mg total) into the skin every 12 (twelve) hours. 01/24/23 02/24/23  Stechschulte, [name], MD  gabapentin (NEURONTIN) 100 MG capsule Take 1 capsule (100 mg total) by mouth every 12 (twelve) hours for 5 days. 01/24/23 01/30/23  Stechschulte, [name], MD  levocetirizine (XYZAL) 5 MG tablet Take 1 tablet (5 mg total) by mouth in the morning as needed for allergies 11/06/22     levocetirizine (XYZAL) 5 MG tablet Take 1 tablet (5 mg total) by mouth daily as needed. 01/09/23     lisdexamfetamine (VYVANSE) 40 MG capsule Take 1 capsule (40 mg total) by mouth in the morning. 12/14/22     ondansetron (ZOFRAN) 4 MG tablet Take 1 tablet (4 mg total) by mouth every 8 (eight) hours as needed for nausea 03/15/21     ondansetron (ZOFRAN-ODT) 4 MG disintegrating tablet Take 1 tablet (4 mg total) by mouth every 6 (six) hours as needed for nausea or vomiting. 01/24/23   Stechschulte, [name], MD  oxyCODONE (OXY IR/ROXICODONE) 5 MG immediate release tablet Take 1 tablet (5 mg total) by mouth every 6 (six) hours as needed for breakthrough pain or severe pain (pain score 7-10). 01/24/23   Stechschulte, [name], MD  pantoprazole (PROTONIX) 40 MG tablet Take 1 tablet (40 mg total) by mouth daily. 01/24/23   Stechschulte, [name], MD  Semaglutide, 2 MG/DOSE, (OZEMPIC,  2 MG/DOSE,) 8 MG/3ML SOPN Inject 2 mg into the skin once a week. 11/06/22         Allergies    Methotrexate, Other, Latex, Metformin, and Metformin and related    Review of Systems   Review of Systems  Cardiovascular:  Positive for chest pain.    Physical Exam Updated Vital Signs BP (!) 147/86   Pulse 63   Temp 98.4 F (36.9 C) (Oral)   Resp 18   Ht 5\' 1"  (1.549 m)   Wt 108.4 kg   SpO2 100%   BMI 45.16 kg/m  Physical Exam  ED Results / Procedures / Treatments   Labs (all labs ordered are listed, but only abnormal results are displayed) Labs Reviewed  RESP PANEL BY RT-PCR  (RSV, FLU A&B, COVID)  RVPGX2 - Abnormal; Notable for the following components:      Result Value   SARS Coronavirus 2 by RT PCR POSITIVE (*)    All other components within normal limits  CBC WITH DIFFERENTIAL/PLATELET - Abnormal; Notable for the following components:   HCT 35.9 (*)    All other components within normal limits  COMPREHENSIVE METABOLIC PANEL - Abnormal; Notable for the following components:   Potassium 3.2 (*)    All other components within normal limits  BRAIN NATRIURETIC PEPTIDE  TROPONIN I (HIGH SENSITIVITY)  TROPONIN I (HIGH SENSITIVITY)    EKG EKG Interpretation Date/Time:  Saturday January 27 2023 10:48:28 EDT Ventricular Rate:  64 PR Interval:  152 QRS Duration:  72 QT Interval:  392 QTC Calculation: 405 R Axis:   6  Text Interpretation: Sinus rhythm Low voltage, precordial leads Borderline T abnormalities, anterior leads Similar to previous Confirmed by Coralee Pesa [phone]) on 01/27/2023 12:36:53 PM  Radiology DG Chest 2 View  Result Date: 01/27/2023 CLINICAL DATA:  Pleural effusion EXAM: CHEST - 2 VIEW COMPARISON:  01/27/2023 9:47 a.m. FINDINGS: The heart size and mediastinal contours are within normal limits. Small bilateral pleural effusions. Unchanged bandlike atelectasis or consolidation of the left midlung. Disc degenerative of the thoracic spine. IMPRESSION: 1. Small bilateral pleural effusions. 2. Unchanged bandlike atelectasis or consolidation of the left midlung. Electronically Signed   By: Jearld Lesch M.D.   On: 01/27/2023 14:08   CT Angio Chest PE W and/or Wo Contrast  Result Date: 01/27/2023 CLINICAL DATA:  Shortness of breath since Thursday. 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 EXAM: CT ANGIOGRAPHY CHEST WITH CONTRAST TECHNIQUE: Multidetector CT imaging of the chest was performed using the standard protocol during bolus administration of intravenous contrast. Multiplanar CT image reconstructions and MIPs were obtained to  evaluate the vascular anatomy. RADIATION DOSE REDUCTION: This exam was performed according to the departmental dose-optimization program which includes automated exposure control, adjustment of the mA and/or kV according to patient size and/or use of iterative reconstruction technique. CONTRAST:  75mL OMNIPAQUE IOHEXOL 350 MG/ML SOLN COMPARISON:  09/24/2015 FINDINGS: Cardiovascular: The heart is normal in size. No pericardial effusion. The aorta is normal in caliber. No dissection. Minimal atherosclerotic calcification at the aortic arch. No definite coronary artery calcifications. The pulmonary arterial tree is fairly well opacified. No filling defects to suggest pulmonary embolism. Mediastinum/Nodes: No mediastinal or hilar mass or lymphadenopathy. The esophagus is unremarkable. There is a small amount of residual thymic tissue noted in the anterior mediastinum unchanged since the prior CT scan. Lungs/Pleura: Moderate-sized right pleural effusion and small left pleural effusion with bibasilar atelectasis. No pneumothorax. No pulmonary lesions. The central tracheobronchial tree is unremarkable.  Upper Abdomen: Surgical changes from gastric sleeve procedure and cholecystectomy. No significant upper abdominal findings. Musculoskeletal: No significant bony findings. Review of the MIP images confirms the above findings. IMPRESSION: 1. No CT findings for pulmonary embolism. 2. Normal thoracic aorta. 3. Moderate-sized right pleural effusion and small left pleural effusion with bibasilar atelectasis. 4. Surgical changes from gastric sleeve procedure and cholecystectomy. Electronically Signed   By: Rudie Meyer M.D.   On: 01/27/2023 12:06    Procedures Procedures   Medications Ordered in ED Medications  iohexol (OMNIPAQUE) 350 MG/ML injection 75 mL (75 mLs Intravenous Contrast Given 01/27/23 1127)    ED Course/ Medical Decision Making/ A&P  Medical Decision Making Amount and/or Complexity of Data  Reviewed Labs: ordered. Radiology: ordered.  Risk Prescription drug management.   47 y.o. female presents to the ER for evaluation of ***. Differential diagnosis includes but is not limited to ***. Vital signs ***. Physical exam as noted above.   I independently reviewed and interpreted the patient's labs. ***.  After consideration of the diagnostic results and the patients response to treatment, I feel that *** .   Emergency department workup does*** not suggest an emergent condition requiring admission or immediate intervention beyond what has been performed at this time.   ***We discussed the results of the labs/imaging. The plan is ***. We discussed strict return precautions and red flag symptoms. The patient verbalized their understanding and agrees to the plan. The patient is stable and being discharged home in good condition.  ***Portions of this report may have been transcribed using voice recognition software. Every effort was made to ensure accuracy; however, inadvertent computerized transcription errors may be present.    Final Clinical Impression(s) / ED Diagnoses Final diagnoses:  None    Rx / DC Orders ED Discharge Orders     None

## 2023-01-27 NOTE — ED Notes (Signed)
 RN reviewed discharge instructions with pt. Pt verbalized understanding and had no further questions. VSS upon discharge.  

## 2023-01-29 ENCOUNTER — Other Ambulatory Visit: Payer: Self-pay

## 2023-01-30 ENCOUNTER — Telehealth (HOSPITAL_COMMUNITY): Payer: Self-pay | Admitting: *Deleted

## 2023-01-30 NOTE — Telephone Encounter (Signed)
1. Tell me about your pain and pain management?     Pt denies any abdominal pain.      2. Let's talk about fluid intake. How much total fluid are you taking in?   Pt states that s/he is working to meet goal of 64 oz of fluid today. Pt encouraged to continue to work towards meeting goal. Pt instructed to assess status and suggestions daily utilizing Hydration Action Plan on discharge folder and to call [facility] if in the "red zone".    3. How much protein have you taken in the last day?    Pt states that she is working to meet the goal of 60g of protein today. Pt plans to drink the reminder of goal throughout the day to meet criteria.     4. Have you had nausea? Tell me about when you have experienced nausea and what you did to help?   Pt denies nausea.   5. Has the frequency or color changed with your urine?   Pt states that s/he is urinating "fine" with no changes in frequency or urgency.   6. Tell me what your incisions look like?   "Incisions look fine". Pt denies a fever, chills. Pt states incisions are not swollen, open, or draining. Pt encouraged to call [facility] if incisions change.      7. Have you been passing gas? BM?   Pt states that they are having BMs.   Pt states that they have had a BM. Pt instructed to take either Miralax or MoM as instructed per "Gastric Bypass/Sleeve Discharge Home Care Instructions". Pt to call surgeon's office if not able to have BM with medication.      8. If a problem or question were to arise who would you call? Do you know contact numbers for [facility], [facility], and [facility]?   Pt knows to call [facility] for surgical, [facility] for nutrition, and [facility] for non-urgent questions or concerns. Pt denies dehydration symptoms. Pt can describe s/sx of dehydration.   9. How has the walking going?   Pt states s/he is walking around and able to be active with some exertion effort noted. Patient stated she thinks it has something to do with recent diagnosis   10. Are you still  using your incentive spirometer? If so, how often?   Pt states that s/he is doing the I.S. Pt encouraged to use incentive spirometer, at least 10x every hour while awake until s/he sees the surgeon. Pt encouraged to get a new one, due to using the prior on in the hospital and now learning the new diagnosis of Covid (+) Pt receptive  11. How are your vitamins and calcium going? How are you taking them?     Pt states that s/he is taking his/her supplements and vitamins without difficulty.       12. How has the anticoagulant Lovenox been going?   LOVENOX: Pt states that s/he is taking the Lovenox injections without difficulty. Reinforced education about taking injections q12h and rotating injection sites. Pt also instructed to monitor for unusual bruising and/or signs of bleeding.  Reminded patient that the first 30 days post-operatively are important for successful recovery. Practice good hand hygiene, and minimizing exposure to people who live outside of the home, especially if they are exhibiting any respiratory, GI, or illness-like symptoms.

## 2023-02-01 ENCOUNTER — Other Ambulatory Visit (HOSPITAL_COMMUNITY): Payer: Self-pay

## 2023-02-02 ENCOUNTER — Encounter: Payer: BC Managed Care – PPO | Attending: Surgery | Admitting: Dietician

## 2023-02-02 ENCOUNTER — Telehealth: Payer: Self-pay

## 2023-02-02 ENCOUNTER — Encounter: Payer: Self-pay | Admitting: Dietician

## 2023-02-02 ENCOUNTER — Ambulatory Visit (INDEPENDENT_AMBULATORY_CARE_PROVIDER_SITE_OTHER): Payer: BC Managed Care – PPO

## 2023-02-02 ENCOUNTER — Other Ambulatory Visit: Payer: Self-pay

## 2023-02-02 VITALS — BP 116/80 | HR 85 | Temp 98.5°F | Resp 18 | Ht 61.0 in | Wt 239.6 lb

## 2023-02-02 VITALS — Ht 61.0 in | Wt 237.3 lb

## 2023-02-02 DIAGNOSIS — E1136 Type 2 diabetes mellitus with diabetic cataract: Secondary | ICD-10-CM | POA: Diagnosis not present

## 2023-02-02 DIAGNOSIS — E669 Obesity, unspecified: Secondary | ICD-10-CM

## 2023-02-02 DIAGNOSIS — Z48815 Encounter for surgical aftercare following surgery on the digestive system: Secondary | ICD-10-CM | POA: Diagnosis not present

## 2023-02-02 DIAGNOSIS — Z7985 Long-term (current) use of injectable non-insulin antidiabetic drugs: Secondary | ICD-10-CM | POA: Insufficient documentation

## 2023-02-02 DIAGNOSIS — Z794 Long term (current) use of insulin: Secondary | ICD-10-CM

## 2023-02-02 DIAGNOSIS — Z713 Dietary counseling and surveillance: Secondary | ICD-10-CM | POA: Insufficient documentation

## 2023-02-02 DIAGNOSIS — Z6841 Body Mass Index (BMI) 40.0 and over, adult: Secondary | ICD-10-CM | POA: Diagnosis not present

## 2023-02-02 DIAGNOSIS — E860 Dehydration: Secondary | ICD-10-CM | POA: Diagnosis not present

## 2023-02-02 DIAGNOSIS — Z9884 Bariatric surgery status: Secondary | ICD-10-CM | POA: Diagnosis not present

## 2023-02-02 DIAGNOSIS — E119 Type 2 diabetes mellitus without complications: Secondary | ICD-10-CM | POA: Diagnosis present

## 2023-02-02 MED ORDER — SODIUM CHLORIDE 0.9 % IV BOLUS
1000.0000 mL | Freq: Once | INTRAVENOUS | Status: AC
  Administered 2023-02-02: 1000 mL via INTRAVENOUS
  Filled 2023-02-02: qty 1000

## 2023-02-02 NOTE — Progress Notes (Signed)
Diagnosis: Dehydration  Provider:  Chilton Greathouse MD  Procedure: IV Infusion  IV Type: Peripheral, IV Location: R Forearm  Normal Saline, Dose: 1000 ml  Infusion Start Time: 1504  Infusion Stop Time: 1617  Post Infusion IV Care: Peripheral IV Discontinued  Discharge: Condition: Good, Destination: Home . AVS Declined  Performed by:  Adriana Mccallum, RN

## 2023-02-02 NOTE — Progress Notes (Signed)
2 Week Post-Operative Nutrition Class   Patient was seen on 02/02/2023 for Post-Operative Nutrition education at the [facility]    Surgery date: 01/23/2023 Surgery type: Sleeve Gastrectomy  Anthropometrics  Start weight at [facility]: 264.4 lbs (date: 11/14/2022)  Height: 61 in Weight today: 237.3 lbs  Clinical   Pharmacotherapy: History of weight loss medication used: Ozempic  Medical hx: sleep apnea, obesity, asthma, HTN, T2DM Medications: Ozempic, zofran, lisinopril, vyvanse, xyzal, vit D,  lipitor, imuran, armodafinil, abilify, albuterol, diamox  Labs: A1c 6.7; urea nitrogen 6; CO2 17; platelets 464; creatinine 1.08; BUN/creatinine ratio 7 Notable signs/symptoms: none noted Any previous deficiencies? No Bowel Habits: Every day to every other day no complaints   Body Composition Scale 02/02/2023  Current Body Weight 237.3  Total Body Fat % 46.4  Visceral Fat 18  Fat-Free Mass % 53.5   Total Body Water % 41.2  Muscle-Mass lbs 28.8  BMI 44.9  Body Fat Displacement          Torso  lbs 68.2         Left Leg  lbs 13.6         Right Leg  lbs 13.6         Left Arm  lbs 6.8         Right Arm  lbs 6.8   Pt states she has been to the ED with Covid and pneumonia, stating she has been dehydrated. Pt states she may be going again today for an IV for hydration today.   The following the learning objectives were met by the patient during this course: Identifies Soft Prepped Plan Advancement Guide  Identifies Soft, High Proteins (Phase 1), beginning 2 weeks post-operatively to 3 weeks post-operatively Identifies Additional Soft High Proteins, soft non-starchy vegetables, fruits and starches (Phase 2), beginning 3 weeks post-operatively to 3 months post-operatively Identifies appropriate sources of fluids, proteins, vegetables, fruits and starches Identifies appropriate fat sources and healthy verses unhealthy fat types   States protein, vegetable, fruit  and starch recommendations and appropriate sources post-operatively Identifies the need for appropriate texture modifications, mastication, and bite sizes when consuming solids Identifies appropriate fat consumption and sources Identifies appropriate multivitamin and calcium sources post-operatively Describes the need for physical activity post-operatively and will follow MD recommendations States when to call healthcare provider regarding medication questions or post-operative complications Aim for 64 oz of hydrating fluids Aim for 60 grams of protein   Handouts given during class include: Soft Prepped Plan Advancement Guide   Follow-Up Plan: Patient will follow-up at [facility] in 10 weeks for 3 month post-op nutrition visit for diet advancement per MD.

## 2023-02-06 ENCOUNTER — Ambulatory Visit: Payer: BC Managed Care – PPO

## 2023-02-13 ENCOUNTER — Telehealth: Payer: Self-pay | Admitting: Dietician

## 2023-02-13 NOTE — Telephone Encounter (Signed)
RD called pt to verify fluid intake once starting soft, solid proteins 2 week post-bariatric surgery.   Daily Fluid intake: 64 oz Daily Protein intake: 60 grams Bowel Habits: no issues  Concerns/issues: no concerns, pt stating everything is going great

## 2023-02-15 ENCOUNTER — Other Ambulatory Visit: Payer: Self-pay

## 2023-02-15 ENCOUNTER — Other Ambulatory Visit (HOSPITAL_COMMUNITY): Payer: Self-pay

## 2023-02-15 MED ORDER — LISDEXAMFETAMINE DIMESYLATE 40 MG PO CAPS
40.0000 mg | ORAL_CAPSULE | Freq: Every morning | ORAL | 0 refills | Status: DC
  Filled 2023-02-15: qty 30, 30d supply, fill #0

## 2023-02-19 ENCOUNTER — Encounter (HOSPITAL_COMMUNITY): Payer: Self-pay

## 2023-02-19 ENCOUNTER — Other Ambulatory Visit (HOSPITAL_COMMUNITY): Payer: Self-pay

## 2023-03-06 ENCOUNTER — Telehealth: Payer: Self-pay | Admitting: Dietician

## 2023-03-06 NOTE — Telephone Encounter (Signed)
Left Voice message with return phone number. Responding to email from patient about struggling to reach fluid and protein goals. Pt's email stating she is still trying to learn her limits. Pt's email also states she tends to have bowel movement about every two to three days.

## 2023-03-16 ENCOUNTER — Other Ambulatory Visit (HOSPITAL_COMMUNITY): Payer: Self-pay

## 2023-03-16 ENCOUNTER — Other Ambulatory Visit: Payer: Self-pay

## 2023-03-16 ENCOUNTER — Telehealth: Payer: Self-pay | Admitting: Dietician

## 2023-03-16 MED ORDER — LEVOCETIRIZINE DIHYDROCHLORIDE 5 MG PO TABS
5.0000 mg | ORAL_TABLET | Freq: Every day | ORAL | 0 refills | Status: DC
  Filled 2023-03-16 – 2023-08-22 (×2): qty 90, 90d supply, fill #0

## 2023-03-16 NOTE — Telephone Encounter (Signed)
Called patient regarding questions from email sent to dietitian about soft prep plan advancement. Pt stated she has been struggling to reach fluid goals, stating she recently reached the goal. Pt express understanding and all questions were answered to the patients satisfaction.

## 2023-03-19 ENCOUNTER — Other Ambulatory Visit: Payer: Self-pay

## 2023-03-19 ENCOUNTER — Other Ambulatory Visit (HOSPITAL_COMMUNITY): Payer: Self-pay

## 2023-03-19 MED ORDER — LISDEXAMFETAMINE DIMESYLATE 40 MG PO CAPS
40.0000 mg | ORAL_CAPSULE | Freq: Every morning | ORAL | 0 refills | Status: DC
  Filled 2023-03-19: qty 30, 30d supply, fill #0

## 2023-03-30 ENCOUNTER — Other Ambulatory Visit (HOSPITAL_COMMUNITY): Payer: Self-pay

## 2023-04-02 ENCOUNTER — Encounter: Payer: Self-pay | Admitting: Pulmonary Disease

## 2023-04-02 ENCOUNTER — Other Ambulatory Visit: Payer: Self-pay | Admitting: Physician Assistant

## 2023-04-02 DIAGNOSIS — Z1231 Encounter for screening mammogram for malignant neoplasm of breast: Secondary | ICD-10-CM

## 2023-04-03 ENCOUNTER — Ambulatory Visit
Admission: RE | Admit: 2023-04-03 | Discharge: 2023-04-03 | Disposition: A | Discharge status: Home / Self Care | Payer: BC Managed Care – PPO | Source: Ambulatory Visit | Attending: Physician Assistant | Admitting: Physician Assistant

## 2023-04-03 DIAGNOSIS — Z1231 Encounter for screening mammogram for malignant neoplasm of breast: Secondary | ICD-10-CM

## 2023-04-23 ENCOUNTER — Encounter (HOSPITAL_COMMUNITY): Payer: Self-pay | Admitting: Surgery

## 2023-04-24 ENCOUNTER — Ambulatory Visit: Payer: BC Managed Care – PPO | Admitting: Dietician

## 2023-04-26 ENCOUNTER — Encounter: Payer: Self-pay | Admitting: Pulmonary Disease

## 2023-04-26 ENCOUNTER — Emergency Department (HOSPITAL_COMMUNITY)
Admission: EM | Admit: 2023-04-26 | Discharge: 2023-04-26 | Disposition: A | Discharge status: Home / Self Care | Payer: 59 | Attending: Emergency Medicine | Admitting: Emergency Medicine

## 2023-04-26 ENCOUNTER — Ambulatory Visit
Admission: RE | Admit: 2023-04-26 | Discharge: 2023-04-26 | Disposition: A | Discharge status: Home / Self Care | Payer: 59 | Source: Ambulatory Visit | Attending: Physician Assistant | Admitting: Physician Assistant

## 2023-04-26 ENCOUNTER — Emergency Department (HOSPITAL_COMMUNITY): Payer: 59

## 2023-04-26 ENCOUNTER — Other Ambulatory Visit: Payer: Self-pay

## 2023-04-26 ENCOUNTER — Encounter (HOSPITAL_COMMUNITY): Payer: Self-pay | Admitting: Emergency Medicine

## 2023-04-26 VITALS — BP 121/85 | HR 70 | Temp 97.9°F

## 2023-04-26 DIAGNOSIS — N3001 Acute cystitis with hematuria: Secondary | ICD-10-CM | POA: Diagnosis not present

## 2023-04-26 DIAGNOSIS — Z91040 Latex allergy status: Secondary | ICD-10-CM | POA: Insufficient documentation

## 2023-04-26 DIAGNOSIS — R1030 Lower abdominal pain, unspecified: Secondary | ICD-10-CM

## 2023-04-26 DIAGNOSIS — R1031 Right lower quadrant pain: Secondary | ICD-10-CM | POA: Diagnosis not present

## 2023-04-26 DIAGNOSIS — J45909 Unspecified asthma, uncomplicated: Secondary | ICD-10-CM | POA: Diagnosis not present

## 2023-04-26 LAB — COMPREHENSIVE METABOLIC PANEL
ALT: 23 U/L (ref 0–44)
AST: 23 U/L (ref 15–41)
Albumin: 3.9 g/dL (ref 3.5–5.0)
Alkaline Phosphatase: 100 U/L (ref 38–126)
Anion gap: 12 (ref 5–15)
BUN: 12 mg/dL (ref 6–20)
CO2: 23 mmol/L (ref 22–32)
Calcium: 9.1 mg/dL (ref 8.9–10.3)
Chloride: 101 mmol/L (ref 98–111)
Creatinine, Ser: 0.9 mg/dL (ref 0.44–1.00)
GFR, Estimated: >60 mL/min (ref >60)
Glucose, Bld: 106 mg/dL — ABNORMAL HIGH (ref 70–99)
Potassium: 3.5 mmol/L (ref 3.5–5.1)
Sodium: 136 mmol/L (ref 135–145)
Total Bilirubin: 0.2 mg/dL (ref 0.0–1.2)
Total Protein: 7.7 g/dL (ref 6.5–8.1)

## 2023-04-26 LAB — URINALYSIS, ROUTINE W REFLEX MICROSCOPIC
Bacteria, UA: NONE SEEN
Bilirubin Urine: NEGATIVE
Glucose, UA: NEGATIVE mg/dL
Hgb urine dipstick: NEGATIVE
Ketones, ur: NEGATIVE mg/dL
Nitrite: NEGATIVE
Protein, ur: NEGATIVE mg/dL
Specific Gravity, Urine: 1.029 (ref 1.005–1.030)
WBC, UA: >50 WBC/hpf (ref 0–5)
pH: 5 (ref 5.0–8.0)

## 2023-04-26 LAB — LIPASE, BLOOD: Lipase: 59 U/L — ABNORMAL HIGH (ref 11–51)

## 2023-04-26 LAB — CBC
HCT: 36.9 % (ref 36.0–46.0)
Hemoglobin: 11.9 g/dL — ABNORMAL LOW (ref 12.0–15.0)
MCH: 31 pg (ref 26.0–34.0)
MCHC: 32.2 g/dL (ref 30.0–36.0)
MCV: 96.1 fL (ref 80.0–100.0)
Platelets: 385 10*3/uL (ref 150–400)
RBC: 3.84 MIL/uL — ABNORMAL LOW (ref 3.87–5.11)
RDW: 16.6 % — ABNORMAL HIGH (ref 11.5–15.5)
WBC: 12.3 10*3/uL — ABNORMAL HIGH (ref 4.0–10.5)
nRBC: 0 % (ref 0.0–0.2)

## 2023-04-26 MED ORDER — CEPHALEXIN 500 MG PO CAPS: 500.0000 mg | ORAL_CAPSULE | Freq: Four times a day (QID) | ORAL | 0 refills | Status: DC

## 2023-04-26 MED ORDER — IOHEXOL 300 MG/ML  SOLN
100.0000 mL | Freq: Once | INTRAMUSCULAR | Status: AC | PRN
  Administered 2023-04-26: 100 mL via INTRAVENOUS

## 2023-04-26 NOTE — Discharge Instructions (Signed)
Keep taking the antibiotic that you started yesterday.  Follow-up with your family doctor when you are through with it.  You can take Tylenol or Motrin for pain

## 2023-04-26 NOTE — ED Notes (Signed)
Patient is being discharged from the Urgent Care and sent to the Emergency Department via pov . Per Rennis Chris, NP, patient is in need of higher level of care due to r/o app. Patient is aware and verbalizes understanding of plan of care.  Vitals:   04/26/23 1945  BP: 121/85  Pulse: 70  Temp: 97.9 F (36.6 C)  SpO2: 97%

## 2023-04-26 NOTE — Discharge Instructions (Addendum)
I am concerned about your appendix. Please go to the nearest ER for further evaluation.

## 2023-04-26 NOTE — ED Triage Notes (Addendum)
Patient c/o abdominal pain x3days.  Patient worsening pain tonight. Patient was seen at urgent care and recommended to be seen in ED for further work up. Patient report Nausea , denies vomitting.

## 2023-04-26 NOTE — ED Triage Notes (Addendum)
Lower abdominal pain that feels like something is ripping out of lower abdomen.  Onset Monday.  Denies pain with urination.  No increase in frequency.  [facility] reported negative cultures.  Denies any disruption in bowel routine.  Has not been intimate in 19 years

## 2023-04-26 NOTE — ED Provider Notes (Signed)
[place] EMERGENCY DEPARTMENT AT [facility] Provider Note   CSN: [id number] Arrival date & time: 04/26/23  2056     History  Chief Complaint  Patient presents with   Abdominal Pain    Denise Cook is a 48 y.o. female.  Patient has a history of asthma and sarcoid.  She complains of lower abdominal pain for 2 to 3 days  The history is provided by the patient and medical records. No language interpreter was used.  Abdominal Pain Pain location:  Suprapubic Pain quality: aching   Pain radiates to:  Does not radiate Pain severity:  Moderate Onset quality:  Sudden Timing:  Constant Progression:  Worsening Chronicity:  New Context: not alcohol use   Relieved by:  Nothing Worsened by:  Nothing Associated symptoms: no chest pain, no cough, no diarrhea, no fatigue and no hematuria        Home Medications Prior to Admission medications   Medication Sig Start Date End Date Taking? Authorizing Provider  acetaminophen (TYLENOL) 500 MG tablet Take 1,000 mg by mouth every 6 (six) hours as needed for headache. Reported on 07/06/2015    [provider]  albuterol (VENTOLIN HFA) 108 (90 Base) MCG/ACT inhaler Inhale 1-2 puffs into the lungs every 4 (four) hours as needed for wheezing 03/15/21     ARIPiprazole (ABILIFY) 5 MG tablet Take 1 tablet (5 mg total) by mouth at bedtime. 01/09/23     Armodafinil 250 MG tablet Take 1 tablet (250 mg total) by mouth daily in the morning. 11/01/22     atorvastatin (LIPITOR) 10 MG tablet Take 1/2 tablet (5 mg total) by mouth daily. Patient not taking: Reported on 04/26/2023 09/07/22 06/21/23  Yates Decamp, MD  azaTHIOprine (IMURAN) 50 MG tablet Take 4 tablets (200 mg total) by mouth daily. Patient taking differently: Take 200 mg by mouth at bedtime. 10/06/22     Blood Glucose Monitoring Suppl (FIFTY50 GLUCOSE METER 2.0) w/Device KIT USE TO CHECK BLOOD SUGAR ONCE DAILY. ALTERNATING MORNINGS AND EVENINGS BEFORE MEALS 08/20/17   [provider]  brimonidine (ALPHAGAN) 0.2 % ophthalmic solution Place 1 drop into both eyes 2 (two) times daily. Patient taking differently: Place 1 drop into the left eye 2 (two) times daily. 07/14/22     Cholecalciferol (VITAMIN D-3) 125 MCG (5000 UT) TABS Take 5,000 Units by mouth daily.    [provider]  dorzolamide-timolol (COSOPT) 2-0.5 % ophthalmic solution Place 1 drop into both eyes 2 (two) times daily. 08/12/19   [provider]  enoxaparin (LOVENOX) 40 MG/0.4ML injection Inject 0.4 mLs (40 mg total) into the skin every 12 (twelve) hours. Patient not taking: Reported on 04/26/2023 01/24/23 02/24/23  Stechschulte, [name], MD  gabapentin (NEURONTIN) 100 MG capsule Take 1 capsule (100 mg total) by mouth every 12 (twelve) hours for 5 days. Patient not taking: Reported on 04/26/2023 01/24/23 01/30/23  Stechschulte, [name], MD  levocetirizine (XYZAL) 5 MG tablet Take 1 tablet (5 mg total) by mouth in the morning as needed for allergies 11/06/22     levocetirizine (XYZAL) 5 MG tablet Take 1 tablet (5 mg total) by mouth daily as needed. 01/09/23     levocetirizine (XYZAL) 5 MG tablet Take 1 tablet (5 mg total) by mouth daily as needed. 03/16/23     lisdexamfetamine (VYVANSE) 40 MG capsule Take 1 capsule (40 mg total) by mouth in the morning. 12/14/22     lisdexamfetamine (VYVANSE) 40 MG capsule Take 1 capsule (40 mg total)  by mouth in the morning. 03/17/23     ondansetron (ZOFRAN) 4 MG tablet Take 1 tablet (4 mg total) by mouth every 8 (eight) hours as needed for nausea 03/15/21     ondansetron (ZOFRAN-ODT) 4 MG disintegrating tablet Take 1 tablet (4 mg total) by mouth every 6 (six) hours as needed for nausea or vomiting. 01/24/23   Stechschulte, [name], MD  oxyCODONE (OXY IR/ROXICODONE) 5 MG immediate release tablet Take 1 tablet (5 mg total) by mouth every 6 (six) hours as needed for breakthrough pain or severe pain (pain score 7-10). Patient not taking: Reported on 04/26/2023  01/24/23   Stechschulte, [name], MD  pantoprazole (PROTONIX) 40 MG tablet Take 1 tablet (40 mg total) by mouth daily. Patient not taking: Reported on 04/26/2023 01/24/23   Stechschulte, [name], MD  Semaglutide, 2 MG/DOSE, (OZEMPIC, 2 MG/DOSE,) 8 MG/3ML SOPN Inject 2 mg into the skin once a week. Patient not taking: Reported on 04/26/2023 11/06/22         Allergies    Methotrexate, Other, Latex, Metformin, and Metformin and related    Review of Systems   Review of Systems  Constitutional:  Negative for appetite change and fatigue.  HENT:  Negative for congestion, ear discharge and sinus pressure.   Eyes:  Negative for discharge.  Respiratory:  Negative for cough.   Cardiovascular:  Negative for chest pain.  Gastrointestinal:  Positive for abdominal pain. Negative for diarrhea.  Genitourinary:  Negative for frequency and hematuria.  Musculoskeletal:  Negative for back pain.  Skin:  Negative for rash.  Neurological:  Negative for seizures and headaches.  Psychiatric/Behavioral:  Negative for hallucinations.     Physical Exam Updated Vital Signs BP 115/79   Pulse 66   Temp 98 F (36.7 C)   Resp 16   Ht 5\' 1"  (1.549 m)   Wt 108.8 kg   SpO2 100%   BMI 45.32 kg/m  Physical Exam Vitals and nursing note reviewed.  Constitutional:      Appearance: She is well-developed.  HENT:     Head: Normocephalic.     Nose: Nose normal.  Eyes:     General: No scleral icterus.    Conjunctiva/sclera: Conjunctivae normal.  Neck:     Thyroid: No thyromegaly.  Cardiovascular:     Rate and Rhythm: Normal rate and regular rhythm.     Heart sounds: No murmur heard.    No friction rub. No gallop.  Pulmonary:     Breath sounds: No stridor. No wheezing or rales.  Chest:     Chest wall: No tenderness.  Abdominal:     General: There is no distension.     Tenderness: There is abdominal tenderness. There is no rebound.  Musculoskeletal:        General: Normal range of motion.     Cervical back:  Neck supple.  Lymphadenopathy:     Cervical: No cervical adenopathy.  Skin:    Findings: No erythema or rash.  Neurological:     Mental Status: She is alert and oriented to person, place, and time.     Motor: No abnormal muscle tone.     Coordination: Coordination normal.  Psychiatric:        Behavior: Behavior normal.     ED Results / Procedures / Treatments   Labs (all labs ordered are listed, but only abnormal results are displayed) Labs Reviewed  COMPREHENSIVE METABOLIC PANEL - Abnormal; Notable for the following components:  Result Value   Glucose, Bld 106 (*)    All other components within normal limits  CBC - Abnormal; Notable for the following components:   WBC 12.3 (*)    RBC 3.84 (*)    Hemoglobin 11.9 (*)    RDW 16.6 (*)    All other components within normal limits  URINALYSIS, ROUTINE W REFLEX MICROSCOPIC - Abnormal; Notable for the following components:   APPearance HAZY (*)    Leukocytes,Ua LARGE (*)    All other components within normal limits  LIPASE, BLOOD - Abnormal; Notable for the following components:   Lipase 59 (*)    All other components within normal limits  URINE CULTURE    EKG None  Radiology No results found.  Procedures Procedures    Medications Ordered in ED Medications  iohexol (OMNIPAQUE) 300 MG/ML solution 100 mL (has no administration in time range)    ED Course/ Medical Decision Making/ A&P  Urinalysis shows UTI.                               Medical Decision Making Amount and/or Complexity of Data Reviewed Labs: ordered. Radiology: ordered.  Risk Prescription drug management.   Patient with abdominal pain and UTI.  CT abdomen unremarkable.  Patient is sent home with antibiotics for UTI        Final Clinical Impression(s) / ED Diagnoses Final diagnoses:  None    Rx / DC Orders ED Discharge Orders     None         Bethann Berkshire, MD 04/29/23 1236

## 2023-04-26 NOTE — ED Provider Notes (Incomplete)
[facility]    CSN: [id number] Arrival date & time: 04/26/23  1745      History   Chief Complaint Chief Complaint  Patient presents with   Abdominal Pain    lower abdominal pain - Entered by patient    HPI Denise Cook is a 48 y.o. female.   Denise Cook is a 48 y.o. female presenting for chief complaint of Abdominal Pain that started Monday at 5:30pm. Took gas-X, didn't help. Had nausea and belching with pain. That lasted that night. Next morning Tuesday still had pain.  Urine test at [facility] walk in clinic told her she had a UTI but urine culture was negative History of hysterectomy and ovaries taken out too- 25 years ago Last HA1C 5.8 Gastric sleeve January 23, 2023  She feels like something is pulling and tearing sensation to the abdomen States something feels like it's going to come out No vaginal discharge, odor, or itch She has not been sexually active in almost 20 years Throbbing pain to the inguinal region Something is "ripping, tearing, and gonna fall out" when walking 8/10 throbbing Nothing makes it better Walking makes pain worse, so does coughing, laughing, and pressing on the area of pain She still has her appendix  She had clammy sweats at first when symptoms first started No documented fever at home  She was sick 2 weeks ago with URI and shew as on antibiotic/prednisone for it  Normal stools, last normal bowel movement was yesterday She feels as though she completely emptied her bowels Pain is sometimes colicky    Abdominal Pain   Past Medical History:  Diagnosis Date   Allergy    Asthma    Chronic kidney disease    Depression    Diabetes mellitus    diet control   Fatty liver    Glaucoma    Hypertension    Obesity    Pneumonia    PONV (postoperative nausea and vomiting)    Pseudotumor cerebri    Sarcoidosis 04/03/2010    Patient Active Problem List   Diagnosis Date Noted   Dehydration 02/02/2023   Morbid  obesity with BMI of 45.0-49.9, adult (HCC) 01/23/2023   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   Type 2 diabetes mellitus (HCC) 08/21/2019   Bipolar 1 disorder (HCC) 04/29/2019   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   PVD (posterior vitreous detachment), bilateral 09/20/2018   Borderline diabetes 09/20/2018   Paresthesias 09/12/2018   Lipoma of left upper extremity 05/04/2017   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, right eye, mild stage 05/14/2015   Sarcoid 12/18/2014   Encounter for long-term (current) use of high-risk medication 12/18/2014   Uveitic glaucoma of both eyes, 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 11/20/2014   Panuveitis, 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   IIH (idiopathic intracranial hypertension) 04/21/2013   Leg pain, bilateral 04/15/2013   Sprain of ankle 06/24/2011    Past Surgical History:  Procedure Laterality Date   ABDOMINAL HYSTERECTOMY     BIOPSY  06/21/2021   Procedure: BIOPSY;  Surgeon: Kerin Salen, MD;  Location: [facility];  Service: Gastroenterology;;   BIOPSY  10/27/2022   Procedure: BIOPSY;  Surgeon: Quentin Ore, MD;  Location: [facility];  Service: General;;   CARDIAC CATHETERIZATION     CHOLECYSTECTOMY     COLONOSCOPY WITH PROPOFOL N/A 06/21/2021   Procedure: COLONOSCOPY WITH PROPOFOL;  Surgeon: Kerin Salen, MD;  Location: [facility];  Service: Gastroenterology;  Laterality: N/A;   ESOPHAGOGASTRODUODENOSCOPY N/A 10/27/2022  Procedure: ESOPHAGOGASTRODUODENOSCOPY (EGD);  Surgeon: Quentin Ore, MD;  Location: [facility];  Service: General;  Laterality: N/A;   EYE SURGERY Left    Removed vitreous membrane   glaucoma shunt     INCISION AND DRAINAGE Right 05/01/2014   Procedure: INCISION AND DRAINAGE RIGHT INDEX FLEXOR SHEATH  ;  Surgeon: Dairl Ponder, MD;  Location: [facility];  Service: Orthopedics;  Laterality: Right;   left eye surgery     TRIGGER FINGER RELEASE Right    UPPER GI ENDOSCOPY N/A  01/23/2023   Procedure: UPPER GI ENDOSCOPY;  Surgeon: Quentin Ore, MD;  Location: [facility];  Service: General;  Laterality: N/A;    OB History   No obstetric history on file.      Home Medications    Prior to Admission medications   Medication Sig Start Date End Date Taking? Authorizing Provider  acetaminophen (TYLENOL) 500 MG tablet Take 1,000 mg by mouth every 6 (six) hours as needed for headache. Reported on 07/06/2015    [provider]  albuterol (VENTOLIN HFA) 108 (90 Base) MCG/ACT inhaler Inhale 1-2 puffs into the lungs every 4 (four) hours as needed for wheezing 03/15/21     ARIPiprazole (ABILIFY) 5 MG tablet Take 1 tablet (5 mg total) by mouth at bedtime. 01/09/23     Armodafinil 250 MG tablet Take 1 tablet (250 mg total) by mouth daily in the morning. 11/01/22     atorvastatin (LIPITOR) 10 MG tablet Take 1/2 tablet (5 mg total) by mouth daily. Patient not taking: Reported on 04/26/2023 09/07/22 06/21/23  Yates Decamp, MD  azaTHIOprine (IMURAN) 50 MG tablet Take 4 tablets (200 mg total) by mouth daily. Patient taking differently: Take 200 mg by mouth at bedtime. 10/06/22     Blood Glucose Monitoring Suppl (FIFTY50 GLUCOSE METER 2.0) w/Device KIT USE TO CHECK BLOOD SUGAR ONCE DAILY. ALTERNATING MORNINGS AND EVENINGS BEFORE MEALS 08/20/17   [provider]  brimonidine (ALPHAGAN) 0.2 % ophthalmic solution Place 1 drop into both eyes 2 (two) times daily. Patient taking differently: Place 1 drop into the left eye 2 (two) times daily. 07/14/22     Cholecalciferol (VITAMIN D-3) 125 MCG (5000 UT) TABS Take 5,000 Units by mouth daily.    [provider]  dorzolamide-timolol (COSOPT) 2-0.5 % ophthalmic solution Place 1 drop into both eyes 2 (two) times daily. 08/12/19   [provider]  enoxaparin (LOVENOX) 40 MG/0.4ML injection Inject 0.4 mLs (40 mg total) into the skin every 12 (twelve) hours. Patient not taking: Reported on 04/26/2023 01/24/23 02/24/23   Stechschulte, [name], MD  gabapentin (NEURONTIN) 100 MG capsule Take 1 capsule (100 mg total) by mouth every 12 (twelve) hours for 5 days. Patient not taking: Reported on 04/26/2023 01/24/23 01/30/23  Stechschulte, [name], MD  levocetirizine (XYZAL) 5 MG tablet Take 1 tablet (5 mg total) by mouth in the morning as needed for allergies 11/06/22     levocetirizine (XYZAL) 5 MG tablet Take 1 tablet (5 mg total) by mouth daily as needed. 01/09/23     levocetirizine (XYZAL) 5 MG tablet Take 1 tablet (5 mg total) by mouth daily as needed. 03/16/23     lisdexamfetamine (VYVANSE) 40 MG capsule Take 1 capsule (40 mg total) by mouth in the morning. 12/14/22     lisdexamfetamine (VYVANSE) 40 MG capsule Take 1 capsule (40 mg total) by mouth in the morning. 03/17/23     ondansetron (ZOFRAN) 4 MG tablet Take 1 tablet (4  mg total) by mouth every 8 (eight) hours as needed for nausea 03/15/21     ondansetron (ZOFRAN-ODT) 4 MG disintegrating tablet Take 1 tablet (4 mg total) by mouth every 6 (six) hours as needed for nausea or vomiting. 01/24/23   Stechschulte, [name], MD  oxyCODONE (OXY IR/ROXICODONE) 5 MG immediate release tablet Take 1 tablet (5 mg total) by mouth every 6 (six) hours as needed for breakthrough pain or severe pain (pain score 7-10). Patient not taking: Reported on 04/26/2023 01/24/23   Stechschulte, [name], MD  pantoprazole (PROTONIX) 40 MG tablet Take 1 tablet (40 mg total) by mouth daily. Patient not taking: Reported on 04/26/2023 01/24/23   Stechschulte, [name], MD  Semaglutide, 2 MG/DOSE, (OZEMPIC, 2 MG/DOSE,) 8 MG/3ML SOPN Inject 2 mg into the skin once a week. Patient not taking: Reported on 04/26/2023 11/06/22       Family History Family History  Adopted: Yes  Problem Relation Age of Onset   Alcoholism Mother    Cancer Maternal Grandmother    Diabetes Paternal Grandmother    Healthy Daughter     Social History Social History   Tobacco Use   Smoking status: Never    Passive exposure:  Never   Smokeless tobacco: Never  Vaping Use   Vaping status: Never Used  Substance Use Topics   Alcohol use: No   Drug use: No     Allergies   Methotrexate, Other, Latex, Metformin, and Metformin and related   Review of Systems Review of Systems  Gastrointestinal:  Positive for abdominal pain.     Physical Exam Triage Vital Signs ED Triage Vitals  Encounter Vitals Group     BP 04/26/23 1945 121/85     Systolic BP Percentile --      Diastolic BP Percentile --      Pulse Rate 04/26/23 1945 70     Resp --      Temp 04/26/23 1945 97.9 F (36.6 C)     Temp Source 04/26/23 1945 Oral     SpO2 04/26/23 1945 97 %     Weight --      Height --      Head Circumference --      Peak Flow --      Pain Score 04/26/23 1955 8     Pain Loc --      Pain Education --      Exclude from Growth Chart --    No data found.  Updated Vital Signs BP 121/85 (BP Location: Right Arm)   Pulse 70   Temp 97.9 F (36.6 C) (Oral)   SpO2 97%   Visual Acuity Right Eye Distance:   Left Eye Distance:   Bilateral Distance:    Right Eye Near:   Left Eye Near:    Bilateral Near:     Physical Exam   UC Treatments / Results  Labs (all labs ordered are listed, but only abnormal results are displayed) Labs Reviewed - No data to display  EKG   Radiology No results found.  Procedures Procedures (including critical care time)  Medications Ordered in UC Medications - No data to display  Initial Impression / Assessment and Plan / UC Course  I have reviewed the triage vital signs and the nursing notes.  Pertinent labs & imaging results that were available during my care of the patient were reviewed by me and considered in my medical decision making (see chart for details).     *** Final  Clinical Impressions(s) / UC Diagnoses   Final diagnoses:  None   Discharge Instructions   None    ED Prescriptions   None    PDMP not reviewed this encounter.

## 2023-04-27 LAB — URINE CULTURE

## 2023-05-02 ENCOUNTER — Ambulatory Visit: Payer: Self-pay | Admitting: Skilled Nursing Facility1

## 2023-05-07 ENCOUNTER — Ambulatory Visit: Payer: Self-pay | Admitting: Dietician

## 2023-05-10 ENCOUNTER — Ambulatory Visit: Payer: Commercial Managed Care - PPO | Admitting: Adult Health

## 2023-05-29 ENCOUNTER — Encounter: Payer: Self-pay | Admitting: Pulmonary Disease

## 2023-05-31 ENCOUNTER — Encounter: Payer: Self-pay | Admitting: Dietician

## 2023-05-31 ENCOUNTER — Encounter: Payer: 59 | Attending: Surgery | Admitting: Dietician

## 2023-05-31 DIAGNOSIS — E119 Type 2 diabetes mellitus without complications: Secondary | ICD-10-CM | POA: Insufficient documentation

## 2023-05-31 DIAGNOSIS — Z794 Long term (current) use of insulin: Secondary | ICD-10-CM | POA: Insufficient documentation

## 2023-05-31 DIAGNOSIS — E669 Obesity, unspecified: Secondary | ICD-10-CM | POA: Diagnosis present

## 2023-05-31 NOTE — Progress Notes (Signed)
 Bariatric Nutrition Follow-Up Visit Medical Nutrition Therapy  Appt Start Time: 0759   End Time: 0829  Surgery date: 01/23/2023 Surgery type: Sleeve Gastrectomy  NUTRITION ASSESSMENT  Anthropometrics  Start weight at [facility]: 264.4 lbs (date: 11/14/2022)  Height: 61 in Weight today: 219.6 lbs  Clinical   Pharmacotherapy: History of weight loss medication used: Ozempic  Medical hx: sleep apnea, obesity, asthma, HTN, T2DM Medications: Ozempic, zofran, lisinopril, vyvanse, xyzal, vit D,  lipitor, imuran, armodafinil, abilify, albuterol, diamox  Labs: A1c 6.7; urea nitrogen 6; CO2 17; platelets 464; creatinine 1.08; BUN/creatinine ratio 7 Notable signs/symptoms: none noted Any previous deficiencies? No Bowel Habits: Every day to every other day no complaints   Body Composition Scale 02/02/2023 05/31/2023  Current Body Weight 237.3 219.6  Total Body Fat % 46.4 44.6  Visceral Fat 18 16  Fat-Free Mass % 53.5 55.3   Total Body Water % 41.2 42.1  Muscle-Mass lbs 28.8 28.7  BMI 44.9 41.6  Body Fat Displacement           Torso  lbs 68.2 60.6         Left Leg  lbs 13.6 12.1         Right Leg  lbs 13.6 12.1         Left Arm  lbs 6.8 6.0         Right Arm  lbs 6.8 6.0    Lifestyle & Dietary Hx  Pt states she has a bowel movement every other day, stating it is soft stools or diarrhea. Pt states she goes to the gym at least 5 days per week, using the treadmill and bike, for 60 minutes, stating that 3 of those days she will do resistance exercises for an additional 20-30 minutes. Pt is doing great.  Estimated daily fluid intake: 64 oz (almost every day) Estimated daily protein intake: 60 g Supplements: multivitamin and calcium Current average weekly physical activity: gym 5 days a week, 60 minutes of cardio, and 3 days per week additional weights for about 20-30 minutes  24-Hr Dietary Recall First Meal: scrambled egg, grits, sausage Snack: protein chips (half a bag)  Second Meal:  chicken, greens, Parys Elenbaas rice Snack:   Third Meal: Imoni Kohen rice or mashed potatoes or chicken or sandwich Snack: protein chips (half a bag) Beverages: water, water with flavorings  Post-Op Goals/ Signs/ Symptoms Using straws: no Drinking while eating: no Chewing/swallowing difficulties: no Changes in vision: no Changes to mood/headaches: no Hair loss/changes to skin/nails: no Difficulty focusing/concentrating: no Sweating: no Limb weakness: no Dizziness/lightheadedness: no Palpitations: no  Carbonated/caffeinated beverages: no N/V/D/C/Gas: soft stools or diarrhea every other day Abdominal pain: no Dumping syndrome: no   NUTRITION DIAGNOSIS  Overweight/obesity ([place]-3.3) related to past poor dietary habits and physical inactivity as evidenced by completed bariatric surgery and following dietary guidelines for continued weight loss and healthy nutrition status.   NUTRITION INTERVENTION Nutrition counseling (C-1) and education (E-2) to facilitate bariatric surgery goals, including: Diet advancement to the standard prep plan The importance of consuming adequate calories as well as certain nutrients daily due to the body's need for essential vitamins, minerals, and fats The importance of daily physical activity and to reach a goal of at least 150 minutes of moderate to vigorous physical activity weekly (or as directed by their physician) due to benefits such as increased musculature and improved lab values The importance of intuitive eating specifically learning hunger-satiety cues and understanding the importance of learning a new body: The importance of mindful  eating to avoid grazing behaviors  Encouraged patient to honor their body's internal hunger and fullness cues.  Throughout the day, check in mentally and rate hunger. Stop eating when satisfied not full regardless of how much food is left on the plate.  Get more if still hungry 20-30 minutes later.  The key is to honor satisfaction so  throughout the meal, rate fullness factor and stop when comfortably satisfied not physically full. The key is to honor hunger and fullness without any feelings of guilt or shame.  Pay attention to what the internal cues are, rather than any external factors. This will enhance the confidence you have in listening to your own body and following those internal cues enabling you to increase how often you eat when you are hungry not out of appetite and stop when you are satisfied not full.  Encouraged pt to continue to eat balanced meals inclusive of non starchy vegetables 2 times a day 7 days a week Encouraged pt to continue to drink a minium 64 fluid ounces with half being plain water to satisfy proper hydration    Handouts Provided Include  Standard Prep Plan Advancement Guide  Learning Style & Readiness for Change Teaching method utilized: Visual & Auditory  Demonstrated degree of understanding via: Teach Back  Readiness Level: ready Barriers to learning/adherence to lifestyle change: nothing identified  RD's Notes for Next Visit Assess adherence to pt chosen goals  MONITORING & EVALUATION Dietary intake, weekly physical activity, body weight.  Next Steps Patient is to follow-up in 3 months for 6 month post-op follow-up.

## 2023-06-05 ENCOUNTER — Encounter: Payer: Self-pay | Admitting: Neurology

## 2023-06-05 ENCOUNTER — Ambulatory Visit: Payer: Commercial Managed Care - PPO | Admitting: Neurology

## 2023-06-05 VITALS — BP 118/76 | HR 82 | Ht 61.0 in | Wt 223.0 lb

## 2023-06-05 DIAGNOSIS — G932 Benign intracranial hypertension: Secondary | ICD-10-CM | POA: Diagnosis not present

## 2023-06-05 NOTE — Patient Instructions (Signed)
 Continue current medications Continue follow-up PCP Return as needed

## 2023-06-05 NOTE — Progress Notes (Signed)
 [facility]  PATIENT: Denise Cook DOB: [birth year]   PRIMARY NEUROLOGIST: Dr. Teresa Coombs REFERRING CLINICIAN: Turmel, Malva Cogan, PA HISTORY FROM: Patient  REASON FOR VISIT: Headaches, history of IIH here for follow up    HISTORICAL  CHIEF COMPLAINT:  Chief Complaint  Patient presents with   Follow-up    Pt in 12, here alone Pt is here following up on IIH. Pt states headaches have gotten better.     INTERVAL HISTORY 06/05/2023:  Patient presents for follow-up, last visit was in February 2024, since then she continues to do well, denies any headaches.  She tells me that she had a gastric sleeve and discontinued both topiramate and acetazolamide.  Even with both medications off, she is not having any headaches, maybe once a month related to stress.  Overall she is doing very well and she has lost a lot of weight from the gastric sleeve.   Interval history 05/09/2022 JM: Patient returns for 1 year follow-up regarding IIH.  She continues on Diamox and topiramate, headaches remain well controlled, only occurs on occasion and will resolve on own.  Routinely followed by ophthalmology, does have procedure this afternoon due to elevated pressure in left eye, reports right eye pressure stable with shunt. Has not been using CPAP recently, needs to replace parts due to leak. Plans on contacting them this week.   History provided for reference purposes only Update 05/09/2021 Dr. Teresa Coombs:  Patient presents today for follow-up, last visit in November plan was to start her Trokendi with her Diamox and to monitor the headaches.  I also advised her to follow-up with her sleep doctor regarding her sleep apnea.  She reports compliance with the topiramate with the Diamox 500 mg twice daily, reported headaches very mild, she does not take any abortive medications.  She is currently satisfied with her current frequency.   She has not followed with her sleep doctors, still has not been using her  CPAP machine.  She reported she tried exercising but is difficult for her, will have leg heaviness after 10 to 15 minutes of walking and she had to take a break.  Denies any recent falls.  Patient also reports she is interested in the gastric bypass surgery, she is approved for it but because of her history of bipolar disorder she is unsure if this will be good for her.  She does not have a psychiatrist or therapist that manage her bipolar.    Consult visit 02/02/2021 Dr. Teresa Coombs:  This is a 48 year old woman with past medical history of pseudotumor cerebri, sarcoidosis, diabetes mellitus and sleep apnea who is presenting to establish care regarding her diagnosis of pseudotumor cerebri.  Patient stated that he has been diagnosed in 2015, has been prescribed on Diamox, currently she is taking Diamox 500 mg twice daily.  She had multiple lumbar punctures, a total of 6, reported lumbar puncture was abnormal, was told that her pressure was high.  Since diagnosis she continues to have headaches, but she reported they are not as bad as before, headache described as heaviness and achiness on top of head, they can last all day.  No photophobia or phonophobia, no migrainous feature but there is occasional visual obscuration present.  She also had a history of obesity and sleep apnea, stated that she qualify for weight loss surgery but she is scared of the surgery.  In regard of her sleep apnea she is not compliant with her CPap machine.    Headache History and  Characteristics: Onset: 2015 Location: Top of head  Quality:  heaviness and achy  Intensity:4 /10.  Duration: can last a day  Migrainous Features: Photophobia, phonophobia, nausea, vomiting.  Aura: No  History of brain injury or tumor: No  Prior prophylaxis: Propranolol: No  Verapamil:No TCA: No Topamax: No Depakote: No Effexor: No Cymbalta: No Neurontin:No  Prior abortives: Triptan: No Anti-emetic: No Steroids: No Ergotamine suppository:  No   OTHER MEDICAL CONDITIONS: cataract uveitis, uveitis, DM, Hypersomnia, CKD, sarcoid    REVIEW OF SYSTEMS: Full 14 system review of systems performed and negative with exception of: As noted in the HPI.  ALLERGIES: Allergies  Allergen Reactions   Methotrexate Other (See Comments)    Unknown reaction   Other Other (See Comments)    PEPPER sriracha turned tongue black    Latex Itching    Patient states "When I wear latex gloves for long periods of time, it'll make my skin itch"   Metformin Rash   Metformin And Related Itching    HOME MEDICATIONS: Outpatient Medications Prior to Visit  Medication Sig Dispense Refill   acetaminophen (TYLENOL) 500 MG tablet Take 1,000 mg by mouth every 6 (six) hours as needed for headache. Reported on 07/06/2015     albuterol (VENTOLIN HFA) 108 (90 Base) MCG/ACT inhaler Inhale 1-2 puffs into the lungs every 4 (four) hours as needed for wheezing 6.7 g 2   ARIPiprazole (ABILIFY) 5 MG tablet Take 1 tablet (5 mg total) by mouth at bedtime. 90 tablet 0   Armodafinil 250 MG tablet Take 1 tablet (250 mg total) by mouth daily in the morning. 30 tablet 5   azaTHIOprine (IMURAN) 50 MG tablet Take 4 tablets (200 mg total) by mouth daily. (Patient taking differently: Take 200 mg by mouth at bedtime.) 360 tablet 3   brimonidine (ALPHAGAN) 0.2 % ophthalmic solution Place 1 drop into both eyes 2 (two) times daily. (Patient taking differently: Place 1 drop into the left eye 2 (two) times daily.) 30 mL 3   buPROPion (WELLBUTRIN XL) 150 MG 24 hr tablet Take 150 mg by mouth every morning.     dorzolamide-timolol (COSOPT) 2-0.5 % ophthalmic solution Place 1 drop into both eyes 2 (two) times daily.     levocetirizine (XYZAL) 5 MG tablet Take 1 tablet (5 mg total) by mouth daily as needed. 90 tablet 0   ondansetron (ZOFRAN) 4 MG tablet Take 1 tablet (4 mg total) by mouth every 8 (eight) hours as needed for nausea 30 tablet 2   prednisoLONE acetate (PRED FORTE) 1 %  ophthalmic suspension Apply to eye.     levocetirizine (XYZAL) 5 MG tablet Take 1 tablet (5 mg total) by mouth in the morning as needed for allergies 90 tablet 0   ondansetron (ZOFRAN-ODT) 4 MG disintegrating tablet Take 1 tablet (4 mg total) by mouth every 6 (six) hours as needed for nausea or vomiting. 40 tablet 0   Blood Glucose Monitoring Suppl (FIFTY50 GLUCOSE METER 2.0) w/Device KIT USE TO CHECK BLOOD SUGAR ONCE DAILY. ALTERNATING MORNINGS AND EVENINGS BEFORE MEALS (Patient not taking: Reported on 06/05/2023)     enoxaparin (LOVENOX) 40 MG/0.4ML injection Inject 0.4 mLs (40 mg total) into the skin every 12 (twelve) hours. (Patient not taking: Reported on 04/26/2023) 24 mL 0   gabapentin (NEURONTIN) 100 MG capsule Take 1 capsule (100 mg total) by mouth every 12 (twelve) hours for 5 days. (Patient not taking: Reported on 04/26/2023) 10 capsule 0   atorvastatin (LIPITOR) 10 MG tablet  Take 1/2 tablet (5 mg total) by mouth daily. (Patient not taking: Reported on 04/26/2023) 45 tablet 1   Cholecalciferol (VITAMIN D-3) 125 MCG (5000 UT) TABS Take 5,000 Units by mouth daily.     levocetirizine (XYZAL) 5 MG tablet Take 1 tablet (5 mg total) by mouth daily as needed. 90 tablet 0   lisdexamfetamine (VYVANSE) 40 MG capsule Take 1 capsule (40 mg total) by mouth in the morning. 30 capsule 0   lisdexamfetamine (VYVANSE) 40 MG capsule Take 1 capsule (40 mg total) by mouth in the morning. 30 capsule 0   oxyCODONE (OXY IR/ROXICODONE) 5 MG immediate release tablet Take 1 tablet (5 mg total) by mouth every 6 (six) hours as needed for breakthrough pain or severe pain (pain score 7-10). (Patient not taking: Reported on 04/26/2023) 10 tablet 0   pantoprazole (PROTONIX) 40 MG tablet Take 1 tablet (40 mg total) by mouth daily. (Patient not taking: Reported on 04/26/2023) 90 tablet 0   Semaglutide, 2 MG/DOSE, (OZEMPIC, 2 MG/DOSE,) 8 MG/3ML SOPN Inject 2 mg into the skin once a week. (Patient not taking: Reported on 04/26/2023) 3  mL 1   No facility-administered medications prior to visit.    PAST MEDICAL HISTORY: Past Medical History:  Diagnosis Date   Allergy    Asthma    Chronic kidney disease    Depression    Diabetes mellitus    diet control   Fatty liver    Glaucoma    Hypertension    Obesity    Pneumonia    PONV (postoperative nausea and vomiting)    Pseudotumor cerebri    Sarcoidosis 04/03/2010    PAST SURGICAL HISTORY: Past Surgical History:  Procedure Laterality Date   ABDOMINAL HYSTERECTOMY     BIOPSY  06/21/2021   Procedure: BIOPSY;  Surgeon: Kerin Salen, MD;  Location: [facility];  Service: Gastroenterology;;   BIOPSY  10/27/2022   Procedure: BIOPSY;  Surgeon: Quentin Ore, MD;  Location: [facility];  Service: General;;   CARDIAC CATHETERIZATION     CATARACT EXTRACTION Left 06/20/2022   and Eye shunt   CHOLECYSTECTOMY     COLONOSCOPY WITH PROPOFOL N/A 06/21/2021   Procedure: COLONOSCOPY WITH PROPOFOL;  Surgeon: Kerin Salen, MD;  Location: [facility];  Service: Gastroenterology;  Laterality: N/A;   ESOPHAGOGASTRODUODENOSCOPY N/A 10/27/2022   Procedure: ESOPHAGOGASTRODUODENOSCOPY (EGD);  Surgeon: Quentin Ore, MD;  Location: [facility];  Service: General;  Laterality: N/A;   EYE SURGERY Left    Removed vitreous membrane   glaucoma shunt     INCISION AND DRAINAGE Right 05/01/2014   Procedure: INCISION AND DRAINAGE RIGHT INDEX FLEXOR SHEATH  ;  Surgeon: Dairl Ponder, MD;  Location: [facility];  Service: Orthopedics;  Laterality: Right;   LAPAROSCOPIC GASTRIC SLEEVE RESECTION     01/23/23 and hernia repair   left eye surgery     TRIGGER FINGER RELEASE Right    UPPER GI ENDOSCOPY N/A 01/23/2023   Procedure: UPPER GI ENDOSCOPY;  Surgeon: Quentin Ore, MD;  Location: [facility];  Service: General;  Laterality: N/A;    FAMILY HISTORY: Family History  Adopted: Yes  Problem Relation Age of Onset   Alcoholism Mother    Cancer Maternal  Grandmother    Diabetes Paternal Grandmother    Healthy Daughter     SOCIAL HISTORY: Social History   Socioeconomic History   Marital status: Single    Spouse name: Not on file   Number of children: 1  Years of education: Not on file   Highest education level: Not on file  Occupational History   Occupation: Nurse, 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   She lives alone.  She has one grown daughter (72).   Social Drivers of Corporate investment banker Strain: Not on file  Food Insecurity: No Food Insecurity (01/23/2023)   Hunger Vital Sign    Worried About Running Out of Food in the Last Year: Never true    Ran Out of Food in the Last Year: Never true  Transportation Needs: No Transportation Needs (01/23/2023)   PRAPARE - Administrator, Civil Service (Medical): No    Lack of Transportation (Non-Medical): No  Physical Activity: Not on file  Stress: Not on file  Social Connections: Not on file  Intimate Partner Violence: Not At Risk (01/23/2023)   Humiliation, Afraid, Rape, and Kick questionnaire    Fear of Current or Ex-Partner: No    Emotionally Abused: No    Physically Abused: No    Sexually Abused: No     PHYSICAL EXAM   GENERAL EXAM/CONSTITUTIONAL: Vitals:  Vitals:   06/05/23 1359  BP: 118/76  Pulse: 82  Weight: 223 lb (101.2 kg)  Height: 5\' 1"  (1.549 m)      Body mass index is 42.14 kg/m. Wt Readings from Last 3 Encounters:  06/05/23 223 lb (101.2 kg)  04/26/23 239 lb 13.8 oz  (108.8 kg)  02/02/23 239 lb 9.6 oz (108.7 kg)   Patient is in no distress; well developed, nourished and groomed; neck is supple. Obese patient   MUSCULOSKELETAL: Gait, strength, tone, movements noted in Neurologic exam below  NEUROLOGIC: MENTAL STATUS:  awake, alert, oriented to person, place and time recent and remote memory intact normal attention and concentration language fluent, comprehension intact, naming intact fund of knowledge appropriate  CRANIAL NERVE:  2nd, 3rd, 4th, 6th -  visual fields full to confrontation, extraocular muscles intact, no nystagmus 5th - facial sensation symmetric 7th - facial strength symmetric 8th - hearing intact 9th - palate elevates symmetrically, uvula midline 11th - shoulder shrug symmetric 12th - tongue protrusion midline  MOTOR:  normal bulk and tone, full strength in the BUE, BLE  SENSORY:  normal and symmetric to light touch  COORDINATION:  finger-nose-finger, fine finger movements normal  GAIT/STATION:  normal   DIAGNOSTIC DATA (LABS, IMAGING, TESTING) - I reviewed patient records, labs, notes, testing and imaging myself where available.  Lab Results  Component Value Date   WBC 12.3 (H) 04/26/2023   HGB 11.9 (L) 04/26/2023   HCT 36.9 04/26/2023   MCV 96.1 04/26/2023   PLT 385 04/26/2023      Component Value Date/Time   NA 136 04/26/2023 2116   K 3.5 04/26/2023 2116   CL 101 04/26/2023 2116   CO2 23 04/26/2023 2116   GLUCOSE 106 (H) 04/26/2023 2116   BUN 12 04/26/2023 2116   CREATININE 0.90 04/26/2023 2116   CREATININE 1.03  07/16/2017 1126   CALCIUM 9.1 04/26/2023 2116   PROT 7.7 04/26/2023 2116   ALBUMIN 3.9 04/26/2023 2116   AST 23 04/26/2023 2116   ALT 23 04/26/2023 2116   ALKPHOS 100 04/26/2023 2116   BILITOT 0.2 04/26/2023 2116   GFRNONAA >60 04/26/2023 2116   GFRNONAA 76 05/26/2013 1840   GFRAA >60 02/11/2018 2052   GFRAA 88 05/26/2013 1840   Lab Results  Component Value Date   CHOL  07/05/2009     124        ATP III CLASSIFICATION:  <200     mg/dL   Desirable  191-478  mg/dL   Borderline High  >=295    mg/dL   High          HDL 37 (L) 07/05/2009   LDLCALC  07/05/2009    61        Total Cholesterol/HDL:CHD Risk Coronary Heart Disease Risk Table                     Men   Women  1/2 Average Risk   3.4   3.3  Average Risk       5.0   4.4  2 X Average Risk   9.6   7.1  3 X Average Risk  23.4   11.0        Use the calculated Patient Ratio above and the CHD Risk Table to determine the patient's CHD Risk.        ATP III CLASSIFICATION (LDL):  <100     mg/dL   Optimal  621-308  mg/dL   Near or Above                    Optimal  130-159  mg/dL   Borderline  657-846  mg/dL   High  >962     mg/dL   Very High   TRIG 952 07/05/2009   CHOLHDL 3.4 07/05/2009   Lab Results  Component Value Date   HGBA1C 6.0 (H) 01/11/2023   Lab Results  Component Value Date   VITAMINB12 324 07/16/2017   Lab Results  Component Value Date   TSH  07/05/2009    1.103 (NOTE) Please note change in reference ranges for ages 57W to 39Y. Test methodology is 3rd generation TSH    Head CT 2022: No acute intracranial abnormalities. Empty Sella   MRI Brain 2019 1.  No acute intracranial abnormality. 2. No abnormal intracranial enhancement or dural thickening to suggest neurosarcoidosis. Mild nonspecific subcortical cerebral white matter signal changes may have progressed since 2015. 3. Chronic partially empty sella and prominence of the optic nerve root sleeves again compatible with idiopathic intracranial hypertension (pseudotumor cerebri). 4. Mild bilateral maxillary sinus inflammation with small fluid levels is new since 2018.    ASSESSMENT AND PLAN  48 y.o. year old female with past medical history of idiopathic intracranial hypertension, obesity, diabetes mellitus, sarcoidosis, bipolar disorder and sleep apnea who is presenting for follow-up for her idiopathic intracranial hypertension.  She  is status post gastric sleeve, both acetazolamide and topiramate discontinued and she is doing very well, plan for patient is to remain off medication, continue following up with PCP, continue to work on her weight loss goal and to return for any other concerns.  She voiced understanding.  Patient Instructions  Continue current medications Continue follow-up PCP Return as needed.    No orders of the defined types were placed in this encounter.  No orders of the defined types were placed in this encounter.   Return if symptoms worsen or fail to improve.    Windell Norfolk, MD  [facility] [address] [place], [place] [address] Phone [phone] Fax [phone]

## 2023-06-11 ENCOUNTER — Encounter: Payer: Self-pay | Admitting: Pulmonary Disease

## 2023-06-15 NOTE — Progress Notes (Signed)
 [facility]: Follow-Up  Assessment & Plan   [name] is a 48 y.o. 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 Worsening macular edema noted, but inflammation is well-managed. Vision improved from 20/80 to 20/30 in the left eye with correction. Pred Forte  was initially used for two weeks, intended for longer use, resulting in significant improvement in swelling. Azathioprine  is within therapeutic range, with no extraocular manifestations except a potential rash on the right lateral leg. - Continue azathioprine  200 mg daily - Restart Pred Forte  drops - Continue Cosopt  and Brimonidine  with ophthalmology  Sarcoidosis No s/s of extrocular manifestations of disease except for potentially right lateral leg and CME.  Urinary Tract Infection (UTI) Bladder infection progressed to pyelonephritis in January. Symptoms, including right flank pain and dysuria, have returned. Treated with ciprofloxacin , Augmentin , doxycycline , and cephalexin , but symptoms persist. Concern for IV antibiotics due to recurrence and resistance to oral antibiotics. CBC today WNL, no sign of infection. UA without abnormality no signs of infection. Atypical symptoms but no repeat UA/culture since her ER visit and treatment with multiple antibiotics. - Order urinalysis and urine culture - Advise ER visit if symptoms worsen, such as fever or increased pain  Potential Skin Rash Rash on the right lateral leg, itching and burning, present for a month and spreading. Hyperpigmented and smooth, not typical of sarcoidosis skin lesions, which are usually painful and red. Dermatology consultation advised for further evaluation and possible biopsy. - Refer to dermatology for evaluation and possible biopsy  Weight Loss Total weight loss of 75 pounds since starting the process, with 30 pounds lost since October post-gastric sleeve  surgery. Actively engaged in weight loss efforts, including gym attendance. - Encourage continued weight loss efforts  Follow-up: Return for 4-5 mo same day with Dr. [facility].    Subjective:      Denise Cook is a 48 y.o. seen in follow up for sarocidosis/panuveitis   Medication Toxicity Labs Recent Labs    08/04/22 0946 03/16/23 1413  CREATININE 0.9 0.8  AST 20 24  ALT 19 27  WBC 9.2 8.3  HGB 12.5 11.9  PLT 464* 322    Today:   Patient seen by Dr. Skeet today, please refer to his note for assessment and plan.  History of Present Illness Denise Cook is a 48 year old female with sarcoidosis who presents for follow-up of pan uveitis OU and urinary symptoms.  She was last seen in December 2024 for pan uveitis OU in the context of sarcoidosis. At that time, she had worsening macular edema but stable inflammation. She was started on Pred Forte  drops and continued on azathioprine  200 mg daily. Her vision has improved significantly, going from 20/80 to 20/30 in the left eye with correction since December. She has not noticed any increased inflammation and has been off Pred Forte  for a couple of months, thinking it was only needed for two weeks. She continues to use Cosopt  and brimonidine  for eye pressure.  She has a history of urinary tract infection that progressed to pyelonephritis in January, treated with multiple antibiotics including Augmentin , ciprofloxacin , and cephalexin . She did not return for follow-up urinalysis after the third antibiotic change but felt better at that time. However, she has been experiencing recurrent symptoms for about a month, including right flank pain and urinary pain, without fever. The pain is described as 'a heavy pain' even when not urinating, located  in the bladder area. She has not had a UTI in a long time prior to this episode.  She reports new skin lesions on the right lateral side of her leg that itch and burn, appearing  about a month ago. The lesions are described as hyperpigmented and smooth, with no pain upon palpation. She has not seen a dermatologist recently but has been to one within the last year or two.  She underwent weight loss surgery (gastric sleeve) in October 2024 and has since lost a total of 75 pounds, with 30 pounds lost since October. She has been going to the gym regularly. She reports no increased inflammation post-surgery.  She is currently taking azathioprine  four tablets daily without side effects and has not been using Pred Forte  drops recently. No chest pain, shortness of breath, or unexplained weight loss. She reports right-sided back pain, described as a knot that hurts, particularly when at certain computer stations at work.   Brief Disease History she was felt to be doing well off medication and was recommended to be observed..   Initial [facility] Consult: 06/08/2006 Initial [facility] Consult: 12/18/2014  Problems initially started around 2008. Intolerant to MTX. Imuran  started 07/2009  Complete Review of Systems: A complete review of systems was obtained and pertinent positives and negatives are recorded above.  Medications: Current Outpatient Medications  Medication Sig Dispense Refill  . acetaminophen  (TYLENOL ) 500 MG tablet Take 1,000 mg by mouth every 8 (eight) hours as needed    . albuterol  90 mcg/actuation inhaler Inhale 2 inhalations into the lungs every 4 (four) hours as needed    . ARIPiprazole  (ABILIFY ) 2 MG tablet Take 2 mg by mouth once daily    . ARIPiprazole  (ABILIFY ) 5 MG tablet Take 5 mg by mouth at bedtime    . armodafiniL  (NUVIGIL ) 250 mg tablet Take 250 mg by mouth once daily    . azaTHIOprine  (IMURAN ) 50 mg tablet Take 4 tablets (200 mg total) by mouth once daily for 90 days 360 tablet 3  . brimonidine  (ALPHAGAN ) 0.2 % ophthalmic solution Place 1 drop into both eyes 2 (two) times daily (Patient taking differently: Place 1 drop into the  right eye 2 (two) times daily) 30 mL 3  . buPROPion (WELLBUTRIN XL) 150 MG XL tablet Take 150 mg by mouth every morning    . dorzolamide -timoloL  (COSOPT ) 22.3-6.8 mg/mL ophthalmic solution Place 1 drop into the right eye 2 (two) times daily    . levocetirizine (XYZAL ) 5 MG tablet Take by mouth    . lisdexamfetamine (VYVANSE ) 40 MG capsule Take 40 mg by mouth every morning (Patient not taking: Reported on 06/15/2023)    . ondansetron  (ZOFRAN ) 8 MG tablet Take 8 mg by mouth every 12 (twelve) hours as needed for Nausea  1  . ondansetron  (ZOFRAN -ODT) 8 MG disintegrating tablet Take 8 mg by mouth every 8 (eight) hours as needed    . pantoprazole  (PROTONIX ) 40 MG DR tablet Take 40 mg by mouth once daily (Patient not taking: Reported on 06/15/2023)    . prednisoLONE  acetate (PRED FORTE ) 1 % ophthalmic suspension Place 1 drop into the left eye 4 (four) times daily (Patient not taking: Reported on 06/15/2023) 5 mL 11   No current facility-administered medications for this visit.    Objective:    Physical Exam: There is no height or weight on file to calculate BMI. Vital Signs:  There were no vitals filed for this visit.  General:Age appropriate, in no acute  distress, pleasant. Neck/Lymph: Supple with no submandibular, cervical, supraclavicular or axillary adenopathy. No thyromegaly. Lungs: Clear to auscultation bilaterally. No wheezes, rales or rhonchi.  Cardiovascular: S1, S2, regular rate and rhythm. No murmurs, rubs or gallops.  ABDOMEN: Right flank tenderness on percussion. Extremities: No clubbing, cyanosis or edema, +2 pulses in all four extremities.  SKIN: Hyperpigmented, itchy, burning lesions on the right lateral leg, 3 slightly smaller than a dime, top lesion slightly rough. No scale. Skin intact. No erythema. Not ttp.  Neurological: Reflexes intact and symmetric. Strength is 5/5 in all extremities, sensation is grossly intact.  Psych: Affect is within normal limits.  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 Denise Cook has a past surgical history that includes Hysterectomy; laparoscopic cholecystectomy; right hand tendon release; insertion aqueous shunt (Right, 12/01/2014); Left eye surgery ; Wisdom teeth extraction; Colonoscopy; destruction ciliary body by cyclophotocoagulation (Left, 05/09/2022); extraction cataract intracapsular w/insertion intraocular prosthesis (Left, 06/20/2022); insertion aqueous shunt (Left, 06/20/2022); Cholecystectomy; and  XI ROBOTIC SLEEVE GASTRECTOMY and HIATAL HERNIA REPAIR UPPER GI ENDOSCOPY (01/23/2023). Denise Cook's family history includes Atrial fibrillation (Abnormal heart rhythm sometimes requiring treatment with blood thinners) in her maternal aunt; Cataracts in her maternal aunt; Coronary Artery Disease (Blocked arteries around heart) in her maternal aunt; Diabetes in her mother; Diabetes type II in her mother; Glaucoma in her maternal aunt; High blood pressure (Hypertension) in her maternal aunt; Pancreatitis in her mother. She was adopted. Denise Cook reports that she has never smoked. She has never used smokeless tobacco. She reports current alcohol use. She reports that she does not use drugs. Denise Cook is allergic to metformin, methotrexate, pepper (genus capsicum), and latex.   Medications and Orders    Orders Placed This Encounter  Procedures  . Culture, Urine, Restricted Patient Population  . Complete Blood Count (CBC) with Differential  . Hepatic Function Panel (HFP)  . Creatinine  . Urinalysis Chemical with Microscopic Review    Requested Prescriptions    No prescriptions requested or ordered in this encounter      This note has been created using automated tools and reviewed for accuracy by LISA RAE CARNAGO.   Attestation Statement:   I personally performed the service, non-incident to. (WP)   LISA BARTLEY HAUSER, NP

## 2023-06-20 ENCOUNTER — Other Ambulatory Visit (HOSPITAL_COMMUNITY): Payer: Self-pay

## 2023-06-20 MED ORDER — KETOROLAC TROMETHAMINE 0.5 % OP SOLN
1.0000 [drp] | Freq: Four times a day (QID) | OPHTHALMIC | 6 refills | Status: AC
  Filled 2023-06-20: qty 10, 50d supply, fill #0
  Filled 2023-09-17: qty 5, 25d supply, fill #0
  Filled 2024-01-15: qty 5, 25d supply, fill #1

## 2023-07-02 ENCOUNTER — Other Ambulatory Visit (HOSPITAL_COMMUNITY): Payer: Self-pay

## 2023-07-04 ENCOUNTER — Other Ambulatory Visit (HOSPITAL_COMMUNITY): Payer: Self-pay

## 2023-07-04 MED ORDER — MOUNJARO 5 MG/0.5ML ~~LOC~~ SOAJ
5.0000 mg | SUBCUTANEOUS | 0 refills | Status: DC
  Filled 2023-07-04: qty 2, 28d supply, fill #0

## 2023-07-05 ENCOUNTER — Other Ambulatory Visit (HOSPITAL_COMMUNITY): Payer: Self-pay

## 2023-07-11 ENCOUNTER — Encounter: Payer: Self-pay | Admitting: Neurology

## 2023-07-12 ENCOUNTER — Other Ambulatory Visit: Payer: Self-pay | Admitting: Neurology

## 2023-07-12 MED ORDER — ACETAZOLAMIDE 250 MG PO TABS: 250.0000 mg | ORAL_TABLET | Freq: Two times a day (BID) | ORAL | 3 refills | Status: DC

## 2023-08-01 ENCOUNTER — Other Ambulatory Visit (HOSPITAL_COMMUNITY): Payer: Self-pay

## 2023-08-01 MED ORDER — MECLIZINE HCL 25 MG PO TABS
25.0000 mg | ORAL_TABLET | Freq: Three times a day (TID) | ORAL | 0 refills | Status: AC | PRN
  Filled 2023-08-01: qty 30, 10d supply, fill #0

## 2023-08-01 MED ORDER — MOUNJARO 5 MG/0.5ML ~~LOC~~ SOAJ
5.0000 mg | SUBCUTANEOUS | 0 refills | Status: DC
  Filled 2023-08-01: qty 2, 28d supply, fill #0

## 2023-08-06 ENCOUNTER — Other Ambulatory Visit: Payer: Self-pay

## 2023-08-06 ENCOUNTER — Other Ambulatory Visit (HOSPITAL_COMMUNITY): Payer: Self-pay

## 2023-08-06 MED ORDER — ARMODAFINIL 200 MG PO TABS
200.0000 mg | ORAL_TABLET | Freq: Every morning | ORAL | 2 refills | Status: AC
  Filled 2023-08-06: qty 30, 30d supply, fill #0
  Filled 2023-09-13: qty 30, 30d supply, fill #1

## 2023-08-13 ENCOUNTER — Other Ambulatory Visit (HOSPITAL_COMMUNITY): Payer: Self-pay

## 2023-08-13 MED ORDER — CYCLOBENZAPRINE HCL 5 MG PO TABS
5.0000 mg | ORAL_TABLET | Freq: Every day | ORAL | 0 refills | Status: AC
Start: 2023-08-13 — End: ?
  Filled 2023-08-13: qty 15, 15d supply, fill #0

## 2023-08-13 MED ORDER — METHYLPREDNISOLONE 4 MG PO TBPK
ORAL_TABLET | ORAL | 0 refills | Status: AC
  Filled 2023-08-13: qty 21, 6d supply, fill #0

## 2023-08-17 NOTE — Progress Notes (Signed)
 Followed by Drs Jaffe/McCallum for uveitis & sarcoidosis   Steroid response/uveitic glaucoma OU, mild OD, severe OS -referred by Dr. Skeet for IOP 34 OS -s/p Baerveldt 350/SPG with rip cord and ligature suture OD 12/01/14 -s/p bilateral IVK 10/30/2014 -s/p synechiolysis, Vision Blue, complex phaco, monofocal PCIOL, BV350, SPG OS (06/20/22) -neg FHx -CCT normal OD/thin OS at 557/528 -08/27/15 hvf 24-2 OD: nonspecific defects, likely stable; OS high FL INS/SNS nonspecific -HVF normal/stable today, since 2017 -IOP good today in 11/19 on cos 2/2, brim 0/2, dmx 500 bid - pt prefers to continue dmx , and pf 2/2 -tolerating dmx and helps with intracranial pressure (has h/o pseudotumor cerebri) -OCT stable OD since 2022, OS with s/t/I thinning & unable to compar to baseline which had a lot of artifacts -IOP up to upper 20s, was 34 OS with Dr. Skeet last month, today better at upper teens after patient used drops more regularly, but now back up to upper 20s OS  -on brim 2/2, cosopt  2/2, diamox  PO, aviding latanoprost due to CME risk -d/w options of MPTDC, trab or tube to lower IOP; rec MPTDC first, pt amenable -s/p MPTDC OS  (05/09/2022) -s/p synechiolysis, Vision Blue, complex phaco, monofocal PCIOL, BV350, SPG OS (06/20/22) -IOP still good in very low teens on brim 2/0, cos 2/2, diamox  500mg  BID (for IIH), off PF & atropine  -HVF 24-2 OD stable, OS with dense superior altitudinal new from 04/2021 (pt has received BVT 06/2022 in the interim), so this is new 24-2 baseline for OS, cont present regimen  -baseline VF 10-2 OS established today showing dense sup altitudinal defect -IOP excellent in very low teens OU, CPM -rtc 4 mo - dilate & nerve OCT OU   Panuveitis OU  -secondary to sarcoid -currently on Imuran  -followed by Dr. Skeet   Pseudophakia OS -s/p synechiolysis, Vision Blue, complex phaco, monofocal PCIOL, BV350, SPG OS (06/20/22) -stable, obs   NS cataract OD -follow    PVD OS -no  tears on DFE    Refractive -NI with MRx   --------------------- HVF 24-2  OU  04/2021 HVF 10-2  OS  08/2023 DFE 09/2022 OCT 09/2022  Independent history and/or interpretation, >3 test results performed/interpreted, plan/management for complex/chronic illness with possible threat to bodily function, test results & disease management reviewed. HPI as documented was explored in detail and no additional concerns reported.  I have reviewed past medical history, family history, social history, medications and allergies as documented in the patient's electronic medical record, and performed medication reconciliation if medications were prescribed or dosages adjusted. I developed the management plan. Either I or my clinical associates counseled the patient/parents on treatment options as outlined above and all questions were answered.

## 2023-08-22 ENCOUNTER — Other Ambulatory Visit (HOSPITAL_COMMUNITY): Payer: Self-pay

## 2023-08-22 MED ORDER — TRAMADOL HCL 50 MG PO TABS
ORAL_TABLET | ORAL | 0 refills | Status: AC
  Filled 2023-08-22: qty 20, 5d supply, fill #0

## 2023-08-22 MED ORDER — PREDNISONE 20 MG PO TABS
ORAL_TABLET | ORAL | 0 refills | Status: AC
Start: 2023-08-22 — End: ?
  Filled 2023-08-22: qty 10, 5d supply, fill #0

## 2023-08-23 ENCOUNTER — Other Ambulatory Visit (HOSPITAL_COMMUNITY): Payer: Self-pay

## 2023-08-23 ENCOUNTER — Other Ambulatory Visit: Payer: Self-pay

## 2023-08-23 MED ORDER — ARIPIPRAZOLE 5 MG PO TABS
5.0000 mg | ORAL_TABLET | Freq: Every day | ORAL | 5 refills | Status: DC
  Filled 2023-08-23: qty 30, 30d supply, fill #0
  Filled 2023-09-17: qty 30, 30d supply, fill #1
  Filled 2023-10-17: qty 30, 30d supply, fill #2
  Filled 2023-11-16: qty 30, 30d supply, fill #3
  Filled 2023-12-16: qty 30, 30d supply, fill #4
  Filled 2024-01-15: qty 30, 30d supply, fill #5

## 2023-08-30 ENCOUNTER — Other Ambulatory Visit (HOSPITAL_COMMUNITY): Payer: Self-pay

## 2023-08-30 MED ORDER — MOUNJARO 5 MG/0.5ML ~~LOC~~ SOAJ
5.0000 mg | SUBCUTANEOUS | 0 refills | Status: AC
  Filled 2023-08-30: qty 2, 28d supply, fill #0

## 2023-09-01 ENCOUNTER — Other Ambulatory Visit (HOSPITAL_COMMUNITY): Payer: Self-pay

## 2023-09-04 ENCOUNTER — Encounter: Payer: 59 | Attending: Surgery | Admitting: Skilled Nursing Facility1

## 2023-09-04 DIAGNOSIS — E119 Type 2 diabetes mellitus without complications: Secondary | ICD-10-CM | POA: Insufficient documentation

## 2023-09-04 DIAGNOSIS — Z794 Long term (current) use of insulin: Secondary | ICD-10-CM | POA: Insufficient documentation

## 2023-09-04 DIAGNOSIS — E669 Obesity, unspecified: Secondary | ICD-10-CM | POA: Diagnosis present

## 2023-09-05 ENCOUNTER — Encounter: Payer: Self-pay | Admitting: Skilled Nursing Facility1

## 2023-09-05 NOTE — Progress Notes (Signed)
 Follow-up visit:  Post-Operative sleeve Surgery  Medical Nutrition Therapy:  Appt start time: 6:00pm end time:  7:00pm  Primary concerns today: Post-operative Bariatric Surgery Nutrition Management 6 Month Post-Op Class  Surgery date: 01/23/2023 Surgery type: Sleeve Gastrectomy  NUTRITION ASSESSMENT  Anthropometrics  Start weight at [facility]: 264.4 lbs (date: 11/14/2022)  Height: 61 in Weight today: pt declined   Clinical   Pharmacotherapy: History of weight loss medication used: Ozempic   Medical hx: sleep apnea, obesity, asthma, HTN, T2DM Medications: Ozempic , zofran , lisinopril , vyvanse , xyzal , vit D,  lipitor, imuran , armodafinil , abilify , albuterol , diamox   Labs: A1c 6.7; urea nitrogen 6; CO2 17; platelets 464; creatinine 1.08; BUN/creatinine ratio 7 Notable signs/symptoms: none noted Any previous deficiencies? No Bowel Habits: Every day to every other day no complaints   Body Composition Scale 02/02/2023 05/31/2023  Current Body Weight 237.3 219.6  Total Body Fat % 46.4 44.6  Visceral Fat 18 16  Fat-Free Mass % 53.5 55.3   Total Body Water  % 41.2 42.1  Muscle-Mass lbs 28.8 28.7  BMI 44.9 41.6  Body Fat Displacement           Torso  lbs 68.2 60.6         Left Leg  lbs 13.6 12.1         Right Leg  lbs 13.6 12.1         Left Arm  lbs 6.8 6.0         Right Arm  lbs 6.8 6.0      Information Reviewed/ Discussed During Appointment: -Review of composition scale numbers -Fluid requirements (64-100 ounces) -Protein requirements (60-80g) -Strategies for tolerating diet -Advancement of diet to include Starchy vegetables -Barriers to inclusion of new foods -Inclusion of appropriate multivitamin and calcium  supplements  -Exercise recommendations   Fluid intake: adequate   Medications: See List Supplementation: appropriate   Using straws: no Drinking while eating: no Having you been chewing well: yes Chewing/swallowing difficulties: no Changes in vision: no Changes  to mood/headaches: no Hair loss/Cahnges to skin/Changes to nails: no Any difficulty focusing or concentrating: no Sweating: no Dizziness/Lightheaded: no Palpitations: no  Carbonated beverages: no N/V/D/C/GAS: no Abdominal Pain: no Dumping syndrome: no  Recent physical activity:  ADL's  Progress Towards Goal(s):  In Progress Teaching method utilized: Visual & Auditory  Demonstrated degree of understanding via: Teach Back  Readiness Level: Action Barriers to learning/adherence to lifestyle change: none identified  Handouts given during visit include: Phase V diet Progression  Goals Sheet The Benefits of Exercise are endless..... Support Group Topics   Teaching Method Utilized:  Visual Auditory Hands on  Demonstrated degree of understanding via:  Teach Back   Monitoring/Evaluation:  Dietary intake, exercise, and body weight. Follow up in 3 months for 9 month post-op visit.

## 2023-09-13 ENCOUNTER — Other Ambulatory Visit: Payer: Self-pay

## 2023-09-17 ENCOUNTER — Other Ambulatory Visit (HOSPITAL_COMMUNITY): Payer: Self-pay

## 2023-09-24 ENCOUNTER — Encounter: Payer: Self-pay | Admitting: Neurology

## 2023-09-27 ENCOUNTER — Other Ambulatory Visit (HOSPITAL_COMMUNITY): Payer: Self-pay

## 2023-09-27 MED ORDER — MOUNJARO 7.5 MG/0.5ML ~~LOC~~ SOAJ
SUBCUTANEOUS | 0 refills | Status: DC
  Filled 2023-09-27: qty 2, 30d supply, fill #0

## 2023-10-01 ENCOUNTER — Other Ambulatory Visit (HOSPITAL_COMMUNITY): Payer: Self-pay

## 2023-10-01 MED ORDER — ARMODAFINIL 250 MG PO TABS
250.0000 mg | ORAL_TABLET | Freq: Every day | ORAL | 2 refills | Status: AC
  Filled 2023-10-01 – 2023-11-05 (×3): qty 30, 30d supply, fill #0

## 2023-10-02 ENCOUNTER — Other Ambulatory Visit (HOSPITAL_COMMUNITY): Payer: Self-pay

## 2023-10-03 ENCOUNTER — Other Ambulatory Visit (HOSPITAL_COMMUNITY): Payer: Self-pay

## 2023-10-17 ENCOUNTER — Other Ambulatory Visit (HOSPITAL_COMMUNITY): Payer: Self-pay

## 2023-10-17 ENCOUNTER — Other Ambulatory Visit: Payer: Self-pay

## 2023-10-17 MED ORDER — MOUNJARO 7.5 MG/0.5ML ~~LOC~~ SOAJ
7.5000 mg | SUBCUTANEOUS | 0 refills | Status: DC
  Filled 2023-10-17 – 2023-10-27 (×2): qty 2, 28d supply, fill #0

## 2023-10-27 ENCOUNTER — Other Ambulatory Visit (HOSPITAL_COMMUNITY): Payer: Self-pay

## 2023-10-29 ENCOUNTER — Emergency Department (HOSPITAL_BASED_OUTPATIENT_CLINIC_OR_DEPARTMENT_OTHER)

## 2023-10-29 ENCOUNTER — Encounter (HOSPITAL_BASED_OUTPATIENT_CLINIC_OR_DEPARTMENT_OTHER): Payer: Self-pay

## 2023-10-29 ENCOUNTER — Other Ambulatory Visit: Payer: Self-pay

## 2023-10-29 ENCOUNTER — Emergency Department (HOSPITAL_BASED_OUTPATIENT_CLINIC_OR_DEPARTMENT_OTHER)
Admission: EM | Admit: 2023-10-29 | Discharge: 2023-10-29 | Disposition: A | Discharge status: Home / Self Care | Source: Ambulatory Visit | Attending: Emergency Medicine | Admitting: Emergency Medicine

## 2023-10-29 ENCOUNTER — Emergency Department (HOSPITAL_BASED_OUTPATIENT_CLINIC_OR_DEPARTMENT_OTHER): Admitting: Radiology

## 2023-10-29 DIAGNOSIS — E1122 Type 2 diabetes mellitus with diabetic chronic kidney disease: Secondary | ICD-10-CM | POA: Insufficient documentation

## 2023-10-29 DIAGNOSIS — R519 Headache, unspecified: Secondary | ICD-10-CM | POA: Insufficient documentation

## 2023-10-29 DIAGNOSIS — N189 Chronic kidney disease, unspecified: Secondary | ICD-10-CM | POA: Insufficient documentation

## 2023-10-29 DIAGNOSIS — I129 Hypertensive chronic kidney disease with stage 1 through stage 4 chronic kidney disease, or unspecified chronic kidney disease: Secondary | ICD-10-CM | POA: Diagnosis not present

## 2023-10-29 DIAGNOSIS — R079 Chest pain, unspecified: Secondary | ICD-10-CM | POA: Diagnosis not present

## 2023-10-29 DIAGNOSIS — R6884 Jaw pain: Secondary | ICD-10-CM | POA: Diagnosis not present

## 2023-10-29 DIAGNOSIS — J45909 Unspecified asthma, uncomplicated: Secondary | ICD-10-CM | POA: Insufficient documentation

## 2023-10-29 DIAGNOSIS — Z79899 Other long term (current) drug therapy: Secondary | ICD-10-CM | POA: Diagnosis not present

## 2023-10-29 DIAGNOSIS — Z91040 Latex allergy status: Secondary | ICD-10-CM | POA: Insufficient documentation

## 2023-10-29 LAB — CBC
HCT: 37.6 % (ref 36.0–46.0)
Hemoglobin: 12.6 g/dL (ref 12.0–15.0)
MCH: 30.8 pg (ref 26.0–34.0)
MCHC: 33.5 g/dL (ref 30.0–36.0)
MCV: 91.9 fL (ref 80.0–100.0)
Platelets: 357 K/uL (ref 150–400)
RBC: 4.09 MIL/uL (ref 3.87–5.11)
RDW: 15.9 % — ABNORMAL HIGH (ref 11.5–15.5)
WBC: 8.3 K/uL (ref 4.0–10.5)
nRBC: 0 % (ref 0.0–0.2)

## 2023-10-29 LAB — D-DIMER, QUANTITATIVE: D-Dimer, Quant: <0.27 ug{FEU}/mL (ref 0.00–0.50)

## 2023-10-29 LAB — TROPONIN T, HIGH SENSITIVITY
Troponin T High Sensitivity: <15 ng/L (ref <19)
Troponin T High Sensitivity: <15 ng/L (ref <19)

## 2023-10-29 LAB — RESP PANEL BY RT-PCR (RSV, FLU A&B, COVID)  RVPGX2
Influenza A by PCR: NEGATIVE
Influenza B by PCR: NEGATIVE
Resp Syncytial Virus by PCR: NEGATIVE
SARS Coronavirus 2 by RT PCR: NEGATIVE

## 2023-10-29 LAB — BASIC METABOLIC PANEL WITH GFR
Anion gap: 14 (ref 5–15)
BUN: 9 mg/dL (ref 6–20)
CO2: 19 mmol/L — ABNORMAL LOW (ref 22–32)
Calcium: 9.8 mg/dL (ref 8.9–10.3)
Chloride: 106 mmol/L (ref 98–111)
Creatinine, Ser: 1 mg/dL (ref 0.44–1.00)
GFR, Estimated: >60 mL/min (ref >60)
Glucose, Bld: 95 mg/dL (ref 70–99)
Potassium: 3.6 mmol/L (ref 3.5–5.1)
Sodium: 139 mmol/L (ref 135–145)

## 2023-10-29 MED ORDER — DIPHENHYDRAMINE HCL 50 MG/ML IJ SOLN
25.0000 mg | Freq: Once | INTRAMUSCULAR | Status: AC
  Administered 2023-10-29: 25 mg via INTRAVENOUS
  Filled 2023-10-29: qty 1

## 2023-10-29 MED ORDER — PROCHLORPERAZINE EDISYLATE 10 MG/2ML IJ SOLN
10.0000 mg | Freq: Once | INTRAMUSCULAR | Status: AC
  Administered 2023-10-29: 10 mg via INTRAVENOUS
  Filled 2023-10-29: qty 2

## 2023-10-29 NOTE — Discharge Instructions (Signed)
 Increase your diamox  from 250mg  to 500mg  in the morning, have repeat labs with your primary care doctor and follow up with your Neurologist. Return if you develop worsening symptoms, numbness, weakness, difficulty talking or walking, fever or other concerns.

## 2023-10-29 NOTE — ED Triage Notes (Signed)
 Pt c/o right side CP started yesterday, left side jaw pain and headache that began this morning. Pt reports sx are intermittent but more constant today.

## 2023-10-29 NOTE — ED Provider Notes (Signed)
 [place] EMERGENCY DEPARTMENT AT [facility] Provider Note   CSN: [id number] Arrival date & time: 10/29/23  1619     Patient presents with: Chest Pain   Denise Cook is a 48 y.o. female.  {Add pertinent medical, surgical, social history, OB history to HPI:32947} HPI      48yo female with history of hypertension, DM, sarcoidosis, IIH, gastric sleeve surgery, uveitis,who presents with concern for chest pain  Right sided chest pain started yesterday, moving over some towards the left, for the most part like pressure and every once and a while feels stabbing. Gas ex didn't help. Not changed by position, deep breaths, walking, eating. Some dyspnea with walking to bathroom which is new.  Feels like alittle hard to breathe but not severe. No nausea no sweating, no abdominal pain.  No hx of cvt/pe, pleural effusions in oct.  No known fam hx but adopted. No etoh, smoking or drugs.  No hx of heart disease. Felt like pain radiating to the jaw   Headache started today while at work, began after jaw pain, left sided jaw pain, headache stasrted slowly worsening over 2 hours to an 8/10, back of head on left side.  Not affected by bright lights, loud sounds. Has to think about whether it feels like high pressure.  Is on the acetazolamide  now since 2 months ago. No fever.  No cough.  [place]lin gto tongue on left, otherwise no numbness, weakness, no change in vision from baseline vision problems, no difficulty walking or talking  Was at conference last week, 6 hour car trip  Past Medical History:  Diagnosis Date   Allergy    Asthma    Chronic kidney disease    Depression    Diabetes mellitus    diet control   Fatty liver    Glaucoma    Hypertension    Obesity    Pneumonia    PONV (postoperative nausea and vomiting)    Pseudotumor cerebri    Sarcoidosis 04/03/2010     Prior to Admission medications   Medication Sig Start Date End Date Taking? Authorizing Provider   acetaZOLAMIDE  (DIAMOX ) 250 MG tablet Take 1 tablet (250 mg total) by mouth 2 (two) times daily. 07/12/23   Camara, Amadou, MD  acetaminophen  (TYLENOL ) 500 MG tablet Take 1,000 mg by mouth every 6 (six) hours as needed for headache. Reported on 07/06/2015    [provider]  albuterol  (VENTOLIN  HFA) 108 (90 Base) MCG/ACT inhaler Inhale 1-2 puffs into the lungs every 4 (four) hours as needed for wheezing 03/15/21     ARIPiprazole  (ABILIFY ) 5 MG tablet Take 1 tablet (5 mg total) by mouth at bedtime. 08/23/23     Armodafinil  200 MG TABS Take 1 tablet (200 mg total) by mouth every morning. 08/06/23     Armodafinil  250 MG tablet Take 1 tablet (250 mg total) by mouth daily in the morning. 11/01/22     Armodafinil  250 MG tablet Take 1 tablet (250 mg total) by mouth every morning 10/01/23     azaTHIOprine  (IMURAN ) 50 MG tablet Take 4 tablets (200 mg total) by mouth daily. Patient taking differently: Take 200 mg by mouth at bedtime. 10/06/22     Blood Glucose Monitoring Suppl (FIFTY50 GLUCOSE METER 2.0) w/Device KIT USE TO CHECK BLOOD SUGAR ONCE DAILY. ALTERNATING MORNINGS AND EVENINGS BEFORE MEALS Patient not taking: Reported on 06/05/2023 08/20/17   [provider]  brimonidine  (ALPHAGAN ) 0.2 % ophthalmic solution Place 1 drop into both eyes 2 (  two) times daily. Patient taking differently: Place 1 drop into the left eye 2 (two) times daily. 07/14/22     buPROPion (WELLBUTRIN XL) 150 MG 24 hr tablet Take 150 mg by mouth every morning. 05/28/23   [provider]  cyclobenzaprine  (FLEXERIL ) 5 MG tablet Take 1 tablet (5 mg total) by mouth 2-3 times daily 08/13/23     dorzolamide -timolol  (COSOPT ) 2-0.5 % ophthalmic solution Place 1 drop into both eyes 2 (two) times daily. 08/12/19   [provider]  enoxaparin  (LOVENOX ) 40 MG/0.4ML injection Inject 0.4 mLs (40 mg total) into the skin every 12 (twelve) hours. Patient not taking: Reported on 04/26/2023 01/24/23 02/24/23  Stechschulte, Deward PARAS,  MD  gabapentin  (NEURONTIN ) 100 MG capsule Take 1 capsule (100 mg total) by mouth every 12 (twelve) hours for 5 days. Patient not taking: Reported on 04/26/2023 01/24/23 01/30/23  Stechschulte, Deward PARAS, MD  ketorolac  (ACULAR ) 0.5 % ophthalmic solution Place 1 drop into the left eye 4 (four) times daily. 06/20/23     levocetirizine (XYZAL ) 5 MG tablet Take 1 tablet (5 mg total) by mouth daily as needed. 03/16/23     meclizine  (ANTIVERT ) 25 MG tablet Take 1 tablet (25 mg total) by mouth 3 (three) times daily as needed for dizziness 08/01/23     methylPREDNISolone  (MEDROL ) 4 MG TBPK tablet Take as instructed for 6 days 08/13/23     ondansetron  (ZOFRAN ) 4 MG tablet Take 1 tablet (4 mg total) by mouth every 8 (eight) hours as needed for nausea 03/15/21     prednisoLONE  acetate (PRED FORTE ) 1 % ophthalmic suspension Apply to eye. 03/16/23   [provider]  predniSONE  (DELTASONE ) 20 MG tablet Take 2 tablets by mouth with food once a day for 5 days 08/22/23     tirzepatide  (MOUNJARO ) 5 MG/0.5ML Pen Inject 5 mg into the skin once a week. 08/30/23     tirzepatide  (MOUNJARO ) 7.5 MG/0.5ML Pen Inject 7.5 mg into the skin once a week. 10/17/23     traMADol  (ULTRAM ) 50 MG tablet Take 1 tablet by mouth every 6 hours as needed for pain 08/22/23       Allergies: Methotrexate, Other, Latex, Metformin, and Metformin and related    Review of Systems  Updated Vital Signs BP 133/78 (BP Location: Right Arm)   Pulse 89   Temp 98.4 F (36.9 C)   Resp 16   SpO2 99%   Physical Exam  (all labs ordered are listed, but only abnormal results are displayed) Labs Reviewed  BASIC METABOLIC PANEL WITH GFR - Abnormal; Notable for the following components:      Result Value   CO2 19 (*)    All other components within normal limits  CBC - Abnormal; Notable for the following components:   RDW 15.9 (*)    All other components within normal limits  TROPONIN T, HIGH SENSITIVITY    EKG: None  Radiology: DG Chest 2  View Result Date: 10/29/2023 CLINICAL DATA:  Intermittent chest pain since yesterday with intermittent left mandibular pain and headache since this morning. EXAM: CHEST - 2 VIEW COMPARISON:  01/30/2023 FINDINGS: The heart size and mediastinal contours are within normal limits. Both lungs are clear. The visualized skeletal structures are unremarkable. IMPRESSION: No active cardiopulmonary disease. Electronically Signed   By: Elspeth Bathe M.D.   On: 10/29/2023 17:28    {Document cardiac monitor, telemetry assessment procedure when appropriate:32947} Procedures   Medications Ordered in the ED - No data to display    {  Click here for ABCD2, HEART and other calculators REFRESH Note before signing:1}                              Medical Decision Making Amount and/or Complexity of Data Reviewed Labs: ordered. Radiology: ordered.   *** EKG, recently evaluated interpreted by me shows a normal sinus rhythm without acute ST changes.  Chest x-ray completed and evaluated by me and radiology shows no evidence of pneumonia, pulmonary edema or pneumothorax  Labs completed and personally evaluated interpreted by me show no clinically significant electrolyte abnormalities, no anemia, no leukocytosis, troponin negative and have low suspicion for ACS.  {Document critical care time when appropriate  Document review of labs and clinical decision tools ie CHADS2VASC2, etc  Document your independent review of radiology images and any outside records  Document your discussion with family members, caretakers and with consultants  Document social determinants of health affecting pt's care  Document your decision making why or why not admission, treatments were needed:32947:::1}   Final diagnoses:  None    ED Discharge Orders     None

## 2023-10-30 ENCOUNTER — Encounter: Payer: Self-pay | Admitting: Neurology

## 2023-10-30 ENCOUNTER — Other Ambulatory Visit: Payer: Self-pay | Admitting: Neurology

## 2023-10-30 MED ORDER — TOPIRAMATE ER 100 MG PO CAP24
100.0000 mg | ORAL_CAPSULE | Freq: Every evening | ORAL | 3 refills | Status: AC
Start: 2023-10-30 — End: 2024-10-24

## 2023-10-30 MED ORDER — ACETAZOLAMIDE 250 MG PO TABS: 500.0000 mg | ORAL_TABLET | Freq: Two times a day (BID) | ORAL | 3 refills | Status: AC

## 2023-11-01 ENCOUNTER — Other Ambulatory Visit (HOSPITAL_COMMUNITY): Payer: Self-pay

## 2023-11-05 ENCOUNTER — Other Ambulatory Visit (HOSPITAL_COMMUNITY): Payer: Self-pay

## 2023-11-05 MED ORDER — MOUNJARO 7.5 MG/0.5ML ~~LOC~~ SOAJ
7.5000 mg | SUBCUTANEOUS | 0 refills | Status: AC
  Filled 2023-11-14 (×2): qty 2, 28d supply, fill #0

## 2023-11-14 ENCOUNTER — Other Ambulatory Visit (HOSPITAL_COMMUNITY): Payer: Self-pay

## 2023-11-16 ENCOUNTER — Other Ambulatory Visit (HOSPITAL_COMMUNITY): Payer: Self-pay

## 2023-11-16 MED ORDER — LEVOCETIRIZINE DIHYDROCHLORIDE 5 MG PO TABS
5.0000 mg | ORAL_TABLET | Freq: Every day | ORAL | 0 refills | Status: DC
  Filled 2023-11-16: qty 90, 90d supply, fill #0

## 2023-11-21 ENCOUNTER — Other Ambulatory Visit: Payer: Self-pay

## 2023-11-29 ENCOUNTER — Other Ambulatory Visit (HOSPITAL_COMMUNITY): Payer: Self-pay

## 2023-11-29 MED ORDER — MOUNJARO 10 MG/0.5ML ~~LOC~~ SOAJ
10.0000 mg | SUBCUTANEOUS | 0 refills | Status: DC
  Filled 2023-11-29 – 2023-12-16 (×2): qty 2, 28d supply, fill #0

## 2023-12-10 ENCOUNTER — Ambulatory Visit: Admitting: Skilled Nursing Facility1

## 2023-12-12 ENCOUNTER — Encounter: Payer: Self-pay | Admitting: Skilled Nursing Facility1

## 2023-12-12 ENCOUNTER — Encounter: Attending: Surgery | Admitting: Skilled Nursing Facility1

## 2023-12-12 DIAGNOSIS — E669 Obesity, unspecified: Secondary | ICD-10-CM | POA: Diagnosis present

## 2023-12-12 DIAGNOSIS — E119 Type 2 diabetes mellitus without complications: Secondary | ICD-10-CM | POA: Insufficient documentation

## 2023-12-12 DIAGNOSIS — Z794 Long term (current) use of insulin: Secondary | ICD-10-CM | POA: Insufficient documentation

## 2023-12-12 NOTE — Progress Notes (Signed)
 Bariatric Nutrition Follow-Up Visit Medical Nutrition Therapy  Appt Start Time: 5:08 End Time: 5:26   Surgery date: 01/23/2023 Surgery type: Sleeve Gastrectomy  Anthropometrics  Start weight at [facility]: 264.4 lbs (date: 11/14/2022)  Height: 61 in Weight today: pt declined   Clinical   Pharmacotherapy: History of weight loss medication used: Ozempic   Medical hx: sleep apnea, obesity, asthma, HTN, T2DM Medications: Ozempic , zofran , lisinopril , vyvanse , xyzal , vit D,  lipitor, imuran , armodafinil , abilify , albuterol , diamox   Labs: A1c 6.7; urea nitrogen 6; CO2 17; platelets 464; creatinine 1.08; BUN/creatinine ratio 7 Notable signs/symptoms: headaches from fluid on the brain and dizzy  Any previous deficiencies? No Bowel Habits: Every day to every other day no complaints   Body Composition Scale 02/02/2023 05/31/2023  Current Body Weight 237.3 219.6  Total Body Fat % 46.4 44.6  Visceral Fat 18 16  Fat-Free Mass % 53.5 55.3   Total Body Water  % 41.2 42.1  Muscle-Mass lbs 28.8 28.7  BMI 44.9 41.6  Body Fat Displacement           Torso  lbs 68.2 60.6         Left Leg  lbs 13.6 12.1         Right Leg  lbs 13.6 12.1         Left Arm  lbs 6.8 6.0         Right Arm  lbs 6.8 6.0   Lifestyle & Dietary Hx  Pt states she feels accomplished and feels good about her goals reached such as decreased A1C, blood pressure stable, and off sleep medication. Pt states she will get a new sleep study to determine if she needs to continue with the sleep mouth piece.  Pt states she just discover grocery delivery.  Pt states he is trying new foods when in the past she had not.    Pt states she will eat: Zucchini Green beans Cabbage Collards Iceberg spinach Cucumber Carrots [place]    Estimated daily fluid intake: 64 oz (almost every day) Estimated daily protein intake: 60 g Supplements: multivitamin and calcium  Current average weekly physical activity: gym 5-6 days a week, treadmill-30  minutes and weights 15-20 minutes   24-Hr Dietary Recall First Meal: eggs and ham Snack: sunflower seeds Second Meal: chicken Snack: sunflower seeds Third Meal: sunflower seeds and popcorn  Snack: sunflower seeds Beverages: water , water  with flavorings, 1 per day isopure lemonade  Post-Op Goals/ Signs/ Symptoms Using straws: no Drinking while eating: no Chewing/swallowing difficulties: no Changes in vision: no Changes to mood/headaches: no Hair loss/changes to skin/nails: no Difficulty focusing/concentrating: no Sweating: no Limb weakness: no Dizziness/lightheadedness: no Palpitations: no  Carbonated/caffeinated beverages: no N/V/D/C/Gas: soft stools or diarrhea every other day Abdominal pain: no Dumping syndrome: no   NUTRITION DIAGNOSIS  Overweight/obesity ([place]-3.3) related to past poor dietary habits and physical inactivity as evidenced by completed bariatric surgery and following dietary guidelines for continued weight loss and healthy nutrition status.   NUTRITION INTERVENTION Nutrition counseling (C-1) and education (E-2) to facilitate bariatric surgery goals, including: Diet advancement to the standard prep plan The importance of consuming adequate calories as well as certain nutrients daily due to the body's need for essential vitamins, minerals, and fats The importance of daily physical activity and to reach a goal of at least 150 minutes of moderate to vigorous physical activity weekly (or as directed by their physician) due to benefits such as increased musculature and improved lab values The importance of intuitive eating specifically learning hunger-satiety  cues and understanding the importance of learning a new body: The importance of mindful eating to avoid grazing behaviors  Encouraged patient to honor their body's internal hunger and fullness cues.  Throughout the day, check in mentally and rate hunger. Stop eating when satisfied not full regardless of how much  food is left on the plate.  Get more if still hungry 20-30 minutes later.  The key is to honor satisfaction so throughout the meal, rate fullness factor and stop when comfortably satisfied not physically full. The key is to honor hunger and fullness without any feelings of guilt or shame.  Pay attention to what the internal cues are, rather than any external factors. This will enhance the confidence you have in listening to your own body and following those internal cues enabling you to increase how often you eat when you are hungry not out of appetite and stop when you are satisfied not full.  Encouraged pt to continue to eat balanced meals inclusive of non starchy vegetables 2 times a day 7 days a week Encouraged pt to continue to drink a minium 64 fluid ounces with half being plain water  to satisfy proper hydration    Handouts Provided Include  Standard Prep Plan Advancement Guide  Goals: Aim to grocery shop weekly getting different fruits and vegetables each week    Learning Style & Readiness for Change Teaching method utilized: Visual & Auditory  Demonstrated degree of understanding via: Teach Back  Readiness Level: ready Barriers to learning/adherence to lifestyle change: nothing identified  RD's Notes for Next Visit Assess adherence to pt chosen goals  MONITORING & EVALUATION Dietary intake, weekly physical activity, body weight.  Next Steps Patient is to follow-up in 4 months

## 2023-12-16 ENCOUNTER — Other Ambulatory Visit (HOSPITAL_COMMUNITY): Payer: Self-pay

## 2023-12-17 ENCOUNTER — Other Ambulatory Visit: Payer: Self-pay

## 2023-12-18 ENCOUNTER — Other Ambulatory Visit (HOSPITAL_COMMUNITY): Payer: Self-pay

## 2023-12-18 MED ORDER — LEVOCETIRIZINE DIHYDROCHLORIDE 5 MG PO TABS
5.0000 mg | ORAL_TABLET | Freq: Every day | ORAL | 1 refills | Status: AC
  Filled 2023-12-18 – 2024-02-14 (×2): qty 90, 90d supply, fill #0
  Filled 2024-05-08: qty 90, 90d supply, fill #1

## 2023-12-19 ENCOUNTER — Other Ambulatory Visit (HOSPITAL_COMMUNITY): Payer: Self-pay

## 2024-01-03 ENCOUNTER — Other Ambulatory Visit (HOSPITAL_COMMUNITY): Payer: Self-pay

## 2024-01-03 MED ORDER — MOUNJARO 10 MG/0.5ML ~~LOC~~ SOAJ
10.0000 mg | SUBCUTANEOUS | 0 refills | Status: DC
  Filled 2024-01-03 – 2024-01-09 (×2): qty 2, 28d supply, fill #0

## 2024-01-09 ENCOUNTER — Other Ambulatory Visit: Payer: Self-pay

## 2024-01-09 ENCOUNTER — Other Ambulatory Visit (HOSPITAL_COMMUNITY): Payer: Self-pay

## 2024-02-12 ENCOUNTER — Other Ambulatory Visit (HOSPITAL_COMMUNITY): Payer: Self-pay

## 2024-02-12 MED ORDER — MOUNJARO 10 MG/0.5ML ~~LOC~~ SOAJ
10.0000 mg | SUBCUTANEOUS | 3 refills | Status: AC
  Filled 2024-02-12: qty 2, 28d supply, fill #0
  Filled 2024-03-12: qty 2, 28d supply, fill #1
  Filled 2024-04-07: qty 2, 28d supply, fill #2
  Filled 2024-05-05: qty 2, 28d supply, fill #3

## 2024-02-14 ENCOUNTER — Other Ambulatory Visit: Payer: Self-pay

## 2024-02-14 ENCOUNTER — Other Ambulatory Visit (HOSPITAL_COMMUNITY): Payer: Self-pay

## 2024-02-14 MED ORDER — ARIPIPRAZOLE 5 MG PO TABS
5.0000 mg | ORAL_TABLET | Freq: Every day | ORAL | 1 refills | Status: DC
  Filled 2024-02-14: qty 30, 30d supply, fill #0
  Filled 2024-03-12: qty 30, 30d supply, fill #1

## 2024-04-09 ENCOUNTER — Other Ambulatory Visit (HOSPITAL_COMMUNITY): Payer: Self-pay

## 2024-04-09 MED ORDER — ARIPIPRAZOLE 5 MG PO TABS
5.0000 mg | ORAL_TABLET | Freq: Every day | ORAL | 0 refills | Status: DC
  Filled 2024-04-09: qty 30, 30d supply, fill #0

## 2024-04-14 ENCOUNTER — Other Ambulatory Visit (HOSPITAL_COMMUNITY): Payer: Self-pay

## 2024-04-14 MED ORDER — OSELTAMIVIR PHOSPHATE 75 MG PO CAPS
75.0000 mg | ORAL_CAPSULE | Freq: Two times a day (BID) | ORAL | 0 refills | Status: AC
  Filled 2024-04-14: qty 10, 5d supply, fill #0

## 2024-05-01 ENCOUNTER — Other Ambulatory Visit (HOSPITAL_COMMUNITY): Payer: Self-pay

## 2024-05-01 MED ORDER — BUPROPION HCL ER (XL) 150 MG PO TB24
150.0000 mg | ORAL_TABLET | Freq: Every morning | ORAL | 1 refills | Status: AC
  Filled 2024-05-01 – 2024-05-08 (×3): qty 90, 90d supply, fill #0

## 2024-05-01 MED ORDER — ARIPIPRAZOLE 5 MG PO TABS
5.0000 mg | ORAL_TABLET | Freq: Every day | ORAL | 1 refills | Status: AC
  Filled 2024-05-01 – 2024-05-05 (×2): qty 90, 90d supply, fill #0

## 2024-05-05 ENCOUNTER — Other Ambulatory Visit: Payer: Self-pay

## 2024-05-05 ENCOUNTER — Other Ambulatory Visit (HOSPITAL_COMMUNITY): Payer: Self-pay

## 2024-05-08 ENCOUNTER — Other Ambulatory Visit (HOSPITAL_COMMUNITY): Payer: Self-pay

## 2024-05-08 ENCOUNTER — Other Ambulatory Visit: Payer: Self-pay
